# Patient Record
Sex: Female | Born: 1935 | Race: White | Hispanic: No | Smoking: Never smoker
Health system: Southern US, Community
[De-identification: ages and names within clinical notes are randomized; demographics above are authoritative.]

## PROBLEM LIST (undated history)

## (undated) DIAGNOSIS — M109 Gout, unspecified: Secondary | ICD-10-CM

## (undated) DIAGNOSIS — I10 Essential (primary) hypertension: Secondary | ICD-10-CM

## (undated) DIAGNOSIS — E119 Type 2 diabetes mellitus without complications: Secondary | ICD-10-CM

## (undated) DIAGNOSIS — R011 Cardiac murmur, unspecified: Secondary | ICD-10-CM

## (undated) DIAGNOSIS — K449 Diaphragmatic hernia without obstruction or gangrene: Secondary | ICD-10-CM

## (undated) DIAGNOSIS — K219 Gastro-esophageal reflux disease without esophagitis: Secondary | ICD-10-CM

## (undated) DIAGNOSIS — I2699 Other pulmonary embolism without acute cor pulmonale: Secondary | ICD-10-CM

## (undated) DIAGNOSIS — K8020 Calculus of gallbladder without cholecystitis without obstruction: Secondary | ICD-10-CM

## (undated) DIAGNOSIS — I82409 Acute embolism and thrombosis of unspecified deep veins of unspecified lower extremity: Secondary | ICD-10-CM

## (undated) DIAGNOSIS — Z8739 Personal history of other diseases of the musculoskeletal system and connective tissue: Secondary | ICD-10-CM

## (undated) DIAGNOSIS — M8580 Other specified disorders of bone density and structure, unspecified site: Secondary | ICD-10-CM

## (undated) DIAGNOSIS — G629 Polyneuropathy, unspecified: Secondary | ICD-10-CM

## (undated) DIAGNOSIS — E785 Hyperlipidemia, unspecified: Secondary | ICD-10-CM

## (undated) DIAGNOSIS — E039 Hypothyroidism, unspecified: Secondary | ICD-10-CM

## (undated) DIAGNOSIS — K635 Polyp of colon: Secondary | ICD-10-CM

## (undated) DIAGNOSIS — K5790 Diverticulosis of intestine, part unspecified, without perforation or abscess without bleeding: Secondary | ICD-10-CM

## (undated) HISTORY — DX: Type 2 diabetes mellitus without complications: E11.9

## (undated) HISTORY — PX: CARPAL TUNNEL RELEASE: SHX101

## (undated) HISTORY — DX: Gastro-esophageal reflux disease without esophagitis: K21.9

## (undated) HISTORY — PX: COLONOSCOPY: SHX174

## (undated) HISTORY — PX: ESOPHAGOGASTRODUODENOSCOPY (EGD) WITH ESOPHAGEAL DILATION: SHX5812

## (undated) HISTORY — PX: CHOLECYSTECTOMY: SHX55

## (undated) HISTORY — PX: CATARACT EXTRACTION: SUR2

## (undated) HISTORY — PX: JOINT REPLACEMENT: SHX530

## (undated) HISTORY — DX: Personal history of other diseases of the musculoskeletal system and connective tissue: Z87.39

## (undated) HISTORY — DX: Diaphragmatic hernia without obstruction or gangrene: K44.9

## (undated) HISTORY — PX: ABDOMINAL HYSTERECTOMY: SHX81

## (undated) HISTORY — DX: Calculus of gallbladder without cholecystitis without obstruction: K80.20

## (undated) HISTORY — DX: Other specified disorders of bone density and structure, unspecified site: M85.80

## (undated) HISTORY — PX: TONSILLECTOMY: SUR1361

## (undated) HISTORY — DX: Polyneuropathy, unspecified: G62.9

## (undated) HISTORY — DX: Diverticulosis of intestine, part unspecified, without perforation or abscess without bleeding: K57.90

## (undated) HISTORY — DX: Polyp of colon: K63.5

## (undated) HISTORY — DX: Essential (primary) hypertension: I10

## (undated) HISTORY — PX: KNEE ARTHROPLASTY: SHX992

## (undated) HISTORY — DX: Hyperlipidemia, unspecified: E78.5

---

## 1993-09-14 HISTORY — PX: CARDIAC CATHETERIZATION: SHX172

## 2008-10-15 LAB — HM DEXA SCAN

## 2012-10-19 ENCOUNTER — Ambulatory Visit (HOSPITAL_BASED_OUTPATIENT_CLINIC_OR_DEPARTMENT_OTHER)
Admission: RE | Admit: 2012-10-19 | Discharge: 2012-10-19 | Disposition: A | Discharge status: Home / Self Care | Payer: Medicare Other | Source: Ambulatory Visit | Attending: Family Medicine | Admitting: Family Medicine

## 2012-10-19 ENCOUNTER — Other Ambulatory Visit (HOSPITAL_BASED_OUTPATIENT_CLINIC_OR_DEPARTMENT_OTHER): Payer: Self-pay | Admitting: Family Medicine

## 2012-10-19 DIAGNOSIS — Z1231 Encounter for screening mammogram for malignant neoplasm of breast: Secondary | ICD-10-CM

## 2012-11-10 ENCOUNTER — Encounter: Payer: Self-pay | Admitting: Family Medicine

## 2012-11-10 DIAGNOSIS — N281 Cyst of kidney, acquired: Secondary | ICD-10-CM | POA: Insufficient documentation

## 2012-11-10 DIAGNOSIS — M8580 Other specified disorders of bone density and structure, unspecified site: Secondary | ICD-10-CM | POA: Insufficient documentation

## 2012-11-10 DIAGNOSIS — K5790 Diverticulosis of intestine, part unspecified, without perforation or abscess without bleeding: Secondary | ICD-10-CM | POA: Insufficient documentation

## 2012-11-10 DIAGNOSIS — Z8739 Personal history of other diseases of the musculoskeletal system and connective tissue: Secondary | ICD-10-CM | POA: Insufficient documentation

## 2012-11-10 DIAGNOSIS — G629 Polyneuropathy, unspecified: Secondary | ICD-10-CM | POA: Insufficient documentation

## 2012-11-10 DIAGNOSIS — K635 Polyp of colon: Secondary | ICD-10-CM | POA: Insufficient documentation

## 2012-11-10 DIAGNOSIS — E119 Type 2 diabetes mellitus without complications: Secondary | ICD-10-CM | POA: Insufficient documentation

## 2012-11-10 DIAGNOSIS — E785 Hyperlipidemia, unspecified: Secondary | ICD-10-CM | POA: Insufficient documentation

## 2012-11-10 DIAGNOSIS — I10 Essential (primary) hypertension: Secondary | ICD-10-CM | POA: Insufficient documentation

## 2012-11-10 DIAGNOSIS — K219 Gastro-esophageal reflux disease without esophagitis: Secondary | ICD-10-CM | POA: Insufficient documentation

## 2012-12-22 ENCOUNTER — Ambulatory Visit: Payer: Medicare Other | Admitting: Family Medicine

## 2013-07-03 ENCOUNTER — Other Ambulatory Visit: Payer: Self-pay | Admitting: *Deleted

## 2013-07-03 DIAGNOSIS — I10 Essential (primary) hypertension: Secondary | ICD-10-CM

## 2013-07-03 DIAGNOSIS — R5381 Other malaise: Secondary | ICD-10-CM

## 2013-07-03 DIAGNOSIS — E785 Hyperlipidemia, unspecified: Secondary | ICD-10-CM

## 2013-07-05 ENCOUNTER — Ambulatory Visit: Payer: Medicare Other

## 2013-07-05 LAB — LIPID PANEL
Cholesterol: 202 mg/dL — ABNORMAL HIGH (ref 0–200)
HDL: 59 mg/dL (ref >39)
LDL Cholesterol: 90 mg/dL (ref 0–99)
Total CHOL/HDL Ratio: 3.4 Ratio
Triglycerides: 267 mg/dL — ABNORMAL HIGH (ref <150)
VLDL: 53 mg/dL — ABNORMAL HIGH (ref 0–40)

## 2013-07-05 LAB — CBC WITH DIFFERENTIAL/PLATELET
Basophils Absolute: 0.1 10*3/uL (ref 0.0–0.1)
Basophils Relative: 1 % (ref 0–1)
Eosinophils Absolute: 0.3 10*3/uL (ref 0.0–0.7)
Eosinophils Relative: 3 % (ref 0–5)
HCT: 34.9 % — ABNORMAL LOW (ref 36.0–46.0)
Hemoglobin: 11.4 g/dL — ABNORMAL LOW (ref 12.0–15.0)
Lymphocytes Relative: 28 % (ref 12–46)
Lymphs Abs: 2.8 10*3/uL (ref 0.7–4.0)
MCH: 28.8 pg (ref 26.0–34.0)
MCHC: 32.7 g/dL (ref 30.0–36.0)
MCV: 88.1 fL (ref 78.0–100.0)
Monocytes Absolute: 0.7 10*3/uL (ref 0.1–1.0)
Monocytes Relative: 7 % (ref 3–12)
Neutro Abs: 6.2 10*3/uL (ref 1.7–7.7)
Neutrophils Relative %: 61 % (ref 43–77)
Platelets: 266 10*3/uL (ref 150–400)
RBC: 3.96 MIL/uL (ref 3.87–5.11)
RDW: 15.5 % (ref 11.5–15.5)
WBC: 10 10*3/uL (ref 4.0–10.5)

## 2013-07-05 LAB — COMPLETE METABOLIC PANEL WITH GFR
ALT: 13 U/L (ref 0–35)
AST: 17 U/L (ref 0–37)
Albumin: 4.5 g/dL (ref 3.5–5.2)
Alkaline Phosphatase: 47 U/L (ref 39–117)
BUN: 30 mg/dL — ABNORMAL HIGH (ref 6–23)
CO2: 25 mEq/L (ref 19–32)
Calcium: 9.3 mg/dL (ref 8.4–10.5)
Chloride: 104 mEq/L (ref 96–112)
Creat: 1.41 mg/dL — ABNORMAL HIGH (ref 0.50–1.10)
GFR, Est African American: 40 mL/min — ABNORMAL LOW
GFR, Est Non African American: 35 mL/min — ABNORMAL LOW
Glucose, Bld: 127 mg/dL — ABNORMAL HIGH (ref 70–99)
Potassium: 4.8 mEq/L (ref 3.5–5.3)
Sodium: 139 mEq/L (ref 135–145)
Total Bilirubin: 0.6 mg/dL (ref 0.3–1.2)
Total Protein: 6.6 g/dL (ref 6.0–8.3)

## 2013-07-06 LAB — MICROALBUMIN, URINE: Microalb, Ur: 5.34 mg/dL — ABNORMAL HIGH (ref 0.00–1.89)

## 2013-07-19 ENCOUNTER — Ambulatory Visit (INDEPENDENT_AMBULATORY_CARE_PROVIDER_SITE_OTHER): Payer: Medicare Other | Admitting: Family Medicine

## 2013-07-19 ENCOUNTER — Encounter: Payer: Self-pay | Admitting: Family Medicine

## 2013-07-19 VITALS — BP 129/64 | HR 68 | Resp 16 | Wt 240.0 lb

## 2013-07-19 DIAGNOSIS — E785 Hyperlipidemia, unspecified: Secondary | ICD-10-CM

## 2013-07-19 DIAGNOSIS — M109 Gout, unspecified: Secondary | ICD-10-CM

## 2013-07-19 DIAGNOSIS — Z23 Encounter for immunization: Secondary | ICD-10-CM

## 2013-07-19 DIAGNOSIS — E039 Hypothyroidism, unspecified: Secondary | ICD-10-CM

## 2013-07-19 DIAGNOSIS — I10 Essential (primary) hypertension: Secondary | ICD-10-CM

## 2013-07-19 DIAGNOSIS — E119 Type 2 diabetes mellitus without complications: Secondary | ICD-10-CM

## 2013-07-19 DIAGNOSIS — R197 Diarrhea, unspecified: Secondary | ICD-10-CM

## 2013-07-19 DIAGNOSIS — R809 Proteinuria, unspecified: Secondary | ICD-10-CM

## 2013-07-19 DIAGNOSIS — D649 Anemia, unspecified: Secondary | ICD-10-CM

## 2013-07-19 LAB — POCT GLYCOSYLATED HEMOGLOBIN (HGB A1C): Hemoglobin A1C: 5.9

## 2013-07-19 MED ORDER — OLMESARTAN-AMLODIPINE-HCTZ 40-10-25 MG PO TABS: 1.0000 | ORAL_TABLET | Freq: Every day | ORAL | Status: DC

## 2013-07-19 MED ORDER — COLESTIPOL HCL 1 G PO TABS: 2.0000 g | ORAL_TABLET | Freq: Two times a day (BID) | ORAL | Status: DC

## 2013-07-19 MED ORDER — ALLOPURINOL 300 MG PO TABS: 300.0000 mg | ORAL_TABLET | Freq: Every day | ORAL | Status: DC

## 2013-07-19 MED ORDER — SAXAGLIPTIN HCL 5 MG PO TABS: 5.0000 mg | ORAL_TABLET | Freq: Every day | ORAL | Status: DC

## 2013-07-19 NOTE — Progress Notes (Signed)
Subjective:    Patient ID: Amber Knox, female    DOB: 25-Oct-1929, 79 y.o.   MRN: [id number]  HPI  Amber Knox is here today to go over her most recent lab results and to discuss the conditions listed below:    1)  Type II DM:  She is doing well taking Metformin 1000 mg (twice daily) and Onglyza 5 mg (once daily).  She continues to check her blood sugars daily and says that they run around 120-130.  She needs both medications refilled.   2)  Hypertension:  Her blood pressure is well controlled with Tribenzor 40-10-25 mg.  She needs a refill on it.   3)  Diarrhea:  She continues taking colestipol 1 g (3 pills in the morning) which helps to reduce her diarrhea.    4)  Gout:  She is doing well with her allopurinol (300 mg).  She also has Colcrys at home but has not had any gout attacks recently.   5)  Fatigue:  She has been feeling very fatigued.     Review of Systems  Constitutional: Positive for activity change and fatigue. Negative for unexpected weight change.  HENT: Negative.   Eyes: Negative for visual disturbance.  Respiratory: Negative for chest tightness and shortness of breath.   Cardiovascular: Negative for chest pain, palpitations and leg swelling.  Gastrointestinal: Positive for diarrhea.  Endocrine: Negative for polydipsia, polyphagia and polyuria.  Genitourinary: Negative for difficulty urinating.  Musculoskeletal: Positive for myalgias. Negative for arthralgias.  Skin: Negative for rash.  Allergic/Immunologic: Negative.   Neurological: Negative.   Hematological: Does not bruise/bleed easily.  Psychiatric/Behavioral: Negative.      Past Medical History  Diagnosis Date  . DM type 2 (diabetes mellitus, type 2)   . Hyperlipemia   . Hypertension   . Peripheral neuropathy   . Osteopenia     Lumbar spine  . Hx of osteoarthritis     bilateral knee osteoarthritis  -   bilateral knee replacement  . Hiatal hernia   . GERD (gastroesophageal reflux disease)   . Kidney  cysts     bilateral simple cysts  . Colon polyps     Tubular Adenoma  . Diverticulosis      Past Surgical History  Procedure Laterality Date  . Abdominal hysterectomy      excessive bleeding (IUD)  . 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 Vascular Disease Father     Cardio  . Diabetes type II Other   . Diabetes type II Other   . Breast cancer Paternal Aunt      Current Outpatient Prescriptions on File Prior to Visit  Medication Sig Dispense Refill  . aspirin 81 MG tablet Take 81 mg by mouth daily.      . colchicine 0.6 MG tablet Take 0.6 mg by mouth 2 (two) times daily.       No current  facility-administered medications on file prior to visit.     Allergies  Allergen Reactions  . Ace Inhibitors Cough     Immunization History  Administered Date(s) Administered  . Influenza,inj,Quad PF,36+ Mos 07/19/2013  . Pneumococcal Conjugate-13 08/17/2005  . Tdap 06/10/2011  . Zoster 02/13/2008      Objective:   Physical Exam  Vitals reviewed. Constitutional: She is oriented to person, place, and time. She appears well-developed and well-nourished. She does not appear ill.  HENT:  Head: Normocephalic and atraumatic. Hair is normal.  Mouth/Throat: Oropharynx is clear and moist.  Eyes: Conjunctivae are normal. No scleral icterus.  Neck: Neck supple. No JVD present. No thyromegaly present.  Cardiovascular: Normal rate and regular rhythm.  Exam reveals no gallop and no friction  rub.   No murmur heard. Pulmonary/Chest: Effort normal and breath sounds normal. No respiratory distress. She has no wheezes. She exhibits no tenderness.  Abdominal: She exhibits no distension and no mass. There is no tenderness.  Musculoskeletal: Normal range of motion. She exhibits no edema and no tenderness.  Lymphadenopathy:    She has no cervical adenopathy.  Neurological: She is alert and oriented to person, place, and time.  Skin: Skin is warm and dry. No rash noted.  Psychiatric: She has a normal mood and affect. Her behavior is normal. Judgment and thought content normal.      Assessment & Plan:   Terriah was seen today for medication management.  Diagnoses and associated orders for this visit:  Type II or unspecified type diabetes mellitus without mention of complication, not stated as uncontrolled  Comments: Her A1c is perfect!! Her creatinine is >1.3 so she may need to hold her metformin.  She is to drink a lot of water and we'll recheck a BMET.    - POCT glycosylated hemoglobin (Hb A1C)  Her A1c is great at 5.9%.    - saxagliptin HCl (ONGLYZA) 5 MG TABS tablet; Take 1 tablet (5 mg total) by mouth daily. - COMPLETE METABOLIC PANEL WITH GFR; Future  Essential hypertension, benign Comments: Well controlled on current meds.   - Olmesartan-Amlodipine-HCTZ (TRIBENZOR) 40-10-25 MG TABS; Take 1 tablet by mouth daily.  Gout Comments: Stable  - allopurinol (ZYLOPRIM) 300 MG tablet; Take 1 tablet (300 mg total) by mouth daily.  Other and unspecified hyperlipidemia Comments: Her triglycerides are high.  She is going to work harder on her diet.    - colestipol (COLESTID) 1 G tablet; Take 2 tablets (2 g total) by mouth 2 (two) times daily.  Diarrhea Comments: Stable  - colestipol (COLESTID) 1 G tablet; Take 2 tablets (2 g total) by mouth 2 (two) times daily.  Unspecified hypothyroidism Comments: Checking her thyroid levels.    - TSH; Future - T3, free; Future - T4, free;  Future  Anemia Comments: Checking her iron and Vitamin B12 levels.    - Iron and TIBC; Future - Vitamin B12; Future  Proteinuria Comments: She is to do a 24 hour urine.   - Protein, Urine, 24 hour; Future  Need for prophylactic vaccination and inoculation against influenza Comments: Flu shot given without difficulty.   - Flu Vaccine QUAD 36+ mos PF IM (Fluarix)  TIME SPENT "FACE TO FACE" WITH PATIENT - 60  MINS

## 2013-07-19 NOTE — Patient Instructions (Signed)
1)  24 hour urine  Wednesday morning - 1st morning urine after midnight discard then collect all the others for the next 24 hours including the next one after midnight. Bring to the office onThursday and get labs.       Proteinuria Proteinuria is a condition in which urine contains more protein than is normal. Proteinuria is either a sign that your body is producing too much protein or a sign that there is a problem with the kidneys. Healthy kidneys prevent most substances that the body needs, including proteins, from leaving the bloodstream and ending up in urine. CAUSES  Proteinuria may be caused by a temporary event or condition such as stress, exercise, or fever, and go away on its own. Proteinuria may also be a symptom of a more serious condition or disease. Causes of proteinuria include:  A kidney disease caused by:  Diabetes.  High blood pressure (hypertension).   A disease that affects the immune system, such as lupus.  A genetic disease, such as Alport's syndrome.  Medicines that damage the kidneys, such as long-term nonsteroidal anti-inflammatory drugs (NSAIDs).  Poisoning or exposure to toxic substances.  A reoccurring kidney or urinary infection.  Excess protein production in the body caused by:  Multiple myeloma.  Amyloidosis. SYMPTOMS You may have proteinuria without having noticeable symptoms. If there is a large amount of protein in your urine, your urine may look foamy. You may also notice swelling (edema) in your hands, feet, abdomen, or face. DIAGNOSIS To determine whether you have proteinuria, you will need to provide a urine sample. Your urine will then be tested for too much protein and the main blood protein albumin. If your test shows that you have proteinuria, you may need to take additional tests to determine its cause, how much protein is in your urine, and what type of protein is being lost. Tests may include:  Blood tests.  Urine tests.  A  blood pressure measurement.  Imaging tests. TREATMENT  Treatment will depend on the cause of your proteinuria. Your caregiver will discuss treatment options with you after you have been diagnosed. If your proteinuria is mild or temporary, no treatment may be necessary. HOME CARE INSTRUCTIONS Ask your caregiver if monitoring the level of protein in your urine at home using simple testing strips is appropriate for you. Early detection of proteinuria can lead to early and often successful treatment of the condition causing it. Document Released: 10/21/2005 Document Revised: 05/25/2012 Document Reviewed: 01/29/2012 [facility] Patient Information 2014 [place], [place].

## 2013-07-26 ENCOUNTER — Other Ambulatory Visit: Payer: Self-pay | Admitting: *Deleted

## 2013-07-26 DIAGNOSIS — E039 Hypothyroidism, unspecified: Secondary | ICD-10-CM

## 2013-07-26 DIAGNOSIS — R809 Proteinuria, unspecified: Secondary | ICD-10-CM

## 2013-07-26 DIAGNOSIS — D649 Anemia, unspecified: Secondary | ICD-10-CM

## 2013-07-26 DIAGNOSIS — E119 Type 2 diabetes mellitus without complications: Secondary | ICD-10-CM

## 2013-07-27 ENCOUNTER — Ambulatory Visit: Payer: Medicare Other | Admitting: Family Medicine

## 2013-07-27 LAB — COMPLETE METABOLIC PANEL WITH GFR
ALT: 11 U/L (ref 0–35)
AST: 14 U/L (ref 0–37)
Albumin: 4.2 g/dL (ref 3.5–5.2)
Alkaline Phosphatase: 46 U/L (ref 39–117)
BUN: 29 mg/dL — ABNORMAL HIGH (ref 6–23)
CO2: 25 mEq/L (ref 19–32)
Calcium: 9.6 mg/dL (ref 8.4–10.5)
Chloride: 103 mEq/L (ref 96–112)
Creat: 1.26 mg/dL — ABNORMAL HIGH (ref 0.50–1.10)
GFR, Est African American: 46 mL/min — ABNORMAL LOW
GFR, Est Non African American: 40 mL/min — ABNORMAL LOW
Glucose, Bld: 130 mg/dL — ABNORMAL HIGH (ref 70–99)
Potassium: 4.3 mEq/L (ref 3.5–5.3)
Sodium: 140 mEq/L (ref 135–145)
Total Bilirubin: 0.6 mg/dL (ref 0.3–1.2)
Total Protein: 6.6 g/dL (ref 6.0–8.3)

## 2013-07-27 LAB — IRON AND TIBC
%SAT: 17 % — ABNORMAL LOW (ref 20–55)
Iron: 58 ug/dL (ref 42–145)
TIBC: 342 ug/dL (ref 250–470)
UIBC: 284 ug/dL (ref 125–400)

## 2013-07-27 LAB — PROTEIN, URINE, 24 HOUR
Protein, 24H Urine: 210 mg/d — ABNORMAL HIGH (ref 50–100)
Protein, Urine: 7 mg/dL

## 2013-07-27 LAB — T4, FREE: Free T4: 1.3 ng/dL (ref 0.80–1.80)

## 2013-07-27 LAB — VITAMIN B12: Vitamin B-12: 427 pg/mL (ref 211–911)

## 2013-07-27 LAB — TSH: TSH: 0.372 u[IU]/mL (ref 0.350–4.500)

## 2013-07-27 LAB — T3, FREE: T3, Free: 2 pg/mL — ABNORMAL LOW (ref 2.3–4.2)

## 2013-07-28 ENCOUNTER — Encounter: Payer: Self-pay | Admitting: Family Medicine

## 2013-07-28 ENCOUNTER — Ambulatory Visit (INDEPENDENT_AMBULATORY_CARE_PROVIDER_SITE_OTHER): Payer: Medicare Other | Admitting: Family Medicine

## 2013-07-28 VITALS — BP 149/73 | HR 63 | Resp 16 | Ht 60.5 in | Wt 248.0 lb

## 2013-07-28 DIAGNOSIS — E039 Hypothyroidism, unspecified: Secondary | ICD-10-CM

## 2013-07-28 DIAGNOSIS — E119 Type 2 diabetes mellitus without complications: Secondary | ICD-10-CM

## 2013-07-28 DIAGNOSIS — D649 Anemia, unspecified: Secondary | ICD-10-CM

## 2013-07-28 MED ORDER — METFORMIN HCL 1000 MG PO TABS: 1000.0000 mg | ORAL_TABLET | Freq: Two times a day (BID) | ORAL | Status: DC

## 2013-07-28 MED ORDER — LEVOTHYROXINE SODIUM 112 MCG PO TABS: 112.0000 ug | ORAL_TABLET | Freq: Every day | ORAL | Status: DC

## 2013-07-28 NOTE — Progress Notes (Signed)
Subjective:    Patient ID: Amber Knox, female    DOB: October 21, 1929, 79 y.o.   MRN: [id number]  HPI  Amber Knox is here today to go over her most recent lab results.  She also wants to discuss the conditions listed below:   1)  Type II DM - She needs a prescription for metformin if she is going to remain on it.      2)  Hypothyroidism - She is currently taking 88 mcg of levothyroxine.  She is experiencing some fatigue and wonders if her dosage should be increased.     Review of Systems  Constitutional: Positive for fatigue.  HENT: Negative.   Eyes: Negative.   Respiratory: Negative.   Cardiovascular: Negative.   Gastrointestinal: Negative.   Endocrine: Negative.   Genitourinary: Negative.   Musculoskeletal: Negative.   Skin: Negative.   Allergic/Immunologic: Negative.   Neurological: Negative.   Hematological: Negative.   Psychiatric/Behavioral: Negative.      Past Medical History  Diagnosis Date  . DM type 2 (diabetes mellitus, type 2)   . Hyperlipemia   . Hypertension   . Peripheral neuropathy   . Osteopenia     Lumbar spine  . Hx of osteoarthritis     bilateral knee osteoarthritis  -   bilateral knee replacement  . Hiatal hernia   . GERD (gastroesophageal reflux disease)   . Kidney cysts     bilateral simple cysts  . Colon polyps     Tubular Adenoma  . Diverticulosis      Family History  Problem Relation Age of Onset  . Vascular Disease Father     Cardio  . Diabetes type II Other   . Diabetes type II Other   . 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 Constitutional: She is oriented to person, place, and time. She appears well-developed and well-nourished.  Cardiovascular: Normal rate and regular rhythm.   Pulmonary/Chest: Effort normal and breath sounds normal.  Neurological: She is alert and oriented to person, place, and time.  Skin: Skin is warm and dry.  Psychiatric: She has a normal mood and affect.      Assessment & Plan:    Amber Knox was seen today for medication management.  Diagnoses and associated orders for this visit:  Unspecified hypothyroidism Comments: We're increasing her levothyroxine from 88 mcg to 112 mcg.   - levothyroxine (SYNTHROID, LEVOTHROID) 112 MCG tablet; Take 1 tablet (112 mcg total) by mouth daily.  Type II or unspecified type diabetes mellitus without mention of complication, not stated as uncontrolled Comments: Her creatinine has decreased so she can stay on the metfromin..   - metFORMIN (GLUCOPHAGE) 1000 MG tablet; Take 1 tablet (1,000 mg total) by mouth 2 (two) times daily with a meal.  Anemia Comments: She is to take some Nu-Iron.  We'll recheck her hemoglobin in 3 months.  TIME SPENT "FACE TO FACE" WITH PATIENT -  30 MINS

## 2013-07-28 NOTE — Patient Instructions (Addendum)
1)  Kidney Function - It is better than before so you can stay on the metformin.  You are having an increased amount of protein come out of your kidneys so try to limit the amount of animal protein you are taking in.    2)  Hypothyroidism - Increase your dosage to 112 mcg.  We'll recheck your level in 3 months.  3)  Iron - Take Nu-Iron 150 mg (1- 2 per day).  We'll recheck your CBC in 3 months.     Proteinuria Proteinuria is a condition in which urine contains more protein than is normal. Proteinuria is either a sign that your body is producing too much protein or a sign that there is a problem with the kidneys. Healthy kidneys prevent most substances that the body needs, including proteins, from leaving the bloodstream and ending up in urine. CAUSES  Proteinuria may be caused by a temporary event or condition such as stress, exercise, or fever, and go away on its own. Proteinuria may also be a symptom of a more serious condition or disease. Causes of proteinuria include:  A kidney disease caused by:  Diabetes.  High blood pressure (hypertension).   A disease that affects the immune system, such as lupus.  A genetic disease, such as Alport's syndrome.  Medicines that damage the kidneys, such as long-term nonsteroidal anti-inflammatory drugs (NSAIDs).  Poisoning or exposure to toxic substances.  A reoccurring kidney or urinary infection.  Excess protein production in the body caused by:  Multiple myeloma.  Amyloidosis. SYMPTOMS You may have proteinuria without having noticeable symptoms. If there is a large amount of protein in your urine, your urine may look foamy. You may also notice swelling (edema) in your hands, feet, abdomen, or face. DIAGNOSIS To determine whether you have proteinuria, you will need to provide a urine sample. Your urine will then be tested for too much protein and the main blood protein albumin. If your test shows that you have proteinuria, you may need to  take additional tests to determine its cause, how much protein is in your urine, and what type of protein is being lost. Tests may include:  Blood tests.  Urine tests.  A blood pressure measurement.  Imaging tests. TREATMENT                                                                                                                                                                                                                    Treatment will depend on  the cause of your proteinuria. Your caregiver will discuss treatment options with you after you have been diagnosed. If your proteinuria is mild or temporary, no treatment may be necessary. HOME CARE INSTRUCTIONS Ask your caregiver if monitoring the level of protein in your urine at home using simple testing strips is appropriate for you. Early detection of proteinuria can lead to early and often successful treatment of the condition causing it. Document Released: 10/21/2005 Document Revised: 05/25/2012 Document Reviewed: 01/29/2012 [facility] Patient Information 2014 [place], [place].

## 2013-09-18 ENCOUNTER — Encounter: Payer: Self-pay | Admitting: Family Medicine

## 2013-09-30 DIAGNOSIS — D649 Anemia, unspecified: Secondary | ICD-10-CM | POA: Insufficient documentation

## 2013-09-30 DIAGNOSIS — E039 Hypothyroidism, unspecified: Secondary | ICD-10-CM | POA: Insufficient documentation

## 2013-09-30 DIAGNOSIS — E119 Type 2 diabetes mellitus without complications: Secondary | ICD-10-CM | POA: Insufficient documentation

## 2013-10-03 ENCOUNTER — Encounter: Payer: Self-pay | Admitting: Family Medicine

## 2013-10-03 ENCOUNTER — Ambulatory Visit (HOSPITAL_BASED_OUTPATIENT_CLINIC_OR_DEPARTMENT_OTHER)
Admission: RE | Admit: 2013-10-03 | Discharge: 2013-10-03 | Disposition: A | Discharge status: Home / Self Care | Payer: Medicare Other | Source: Ambulatory Visit | Attending: Family Medicine | Admitting: Family Medicine

## 2013-10-03 ENCOUNTER — Ambulatory Visit (INDEPENDENT_AMBULATORY_CARE_PROVIDER_SITE_OTHER): Payer: Medicare Other | Admitting: Family Medicine

## 2013-10-03 VITALS — BP 145/73 | HR 71 | Resp 16 | Wt 236.0 lb

## 2013-10-03 DIAGNOSIS — M169 Osteoarthritis of hip, unspecified: Secondary | ICD-10-CM | POA: Insufficient documentation

## 2013-10-03 DIAGNOSIS — M25559 Pain in unspecified hip: Secondary | ICD-10-CM

## 2013-10-03 DIAGNOSIS — W19XXXA Unspecified fall, initial encounter: Secondary | ICD-10-CM | POA: Insufficient documentation

## 2013-10-03 DIAGNOSIS — M25551 Pain in right hip: Secondary | ICD-10-CM

## 2013-10-03 DIAGNOSIS — M25552 Pain in left hip: Principal | ICD-10-CM

## 2013-10-03 DIAGNOSIS — M1610 Unilateral primary osteoarthritis, unspecified hip: Secondary | ICD-10-CM | POA: Insufficient documentation

## 2013-10-03 MED ORDER — METHYLPREDNISOLONE SODIUM SUCC 125 MG IJ SOLR
125.0000 mg | Freq: Once | INTRAMUSCULAR | Status: AC
  Administered 2013-10-03: 125 mg via INTRAMUSCULAR

## 2013-10-03 MED ORDER — KETOROLAC TROMETHAMINE 60 MG/2ML IM SOLN
60.0000 mg | Freq: Once | INTRAMUSCULAR | Status: AC
  Administered 2013-10-03: 60 mg via INTRAMUSCULAR

## 2013-10-03 MED ORDER — TRAMADOL-ACETAMINOPHEN 37.5-325 MG PO TABS: ORAL_TABLET | ORAL | Status: DC

## 2013-10-03 MED ORDER — TIZANIDINE HCL 4 MG PO TABS: 4.0000 mg | ORAL_TABLET | Freq: Three times a day (TID) | ORAL | Status: DC

## 2013-10-03 NOTE — Progress Notes (Signed)
Subjective:    Patient ID: Amber Knox, female    DOB: 06-23-1930, 80 y.o.   MRN: [id number]  HPI  Amber Knox is here today complaining of back/ hip pain.  She says that she fell about a month ago.  She was trying to climb into a tall bed and fell on her knees and hands.  She has been seeing her chiropractor (Dr. Collins [place]) since she fell.  She feels that his therapy has helped her with everything else except her lower back and hip pain.  She has taken Tylenol and Advil which have helped her very much.    Review of Systems  Musculoskeletal: Positive for back pain.       Radiates to both hips.   Neurological: Negative for weakness and numbness.    Past Medical History  Diagnosis Date  . DM type 2 (diabetes mellitus, type 2)   . Hyperlipemia   . Hypertension   . Peripheral neuropathy   . Osteopenia     Lumbar spine  . Hx of osteoarthritis     bilateral knee osteoarthritis  -   bilateral knee replacement  . Hiatal hernia   . GERD (gastroesophageal reflux disease)   . Kidney cysts     bilateral simple cysts  . Colon polyps     Tubular Adenoma  . Diverticulosis      Past Surgical History  Procedure Laterality Date  . Abdominal hysterectomy      excessive bleeding (IUD)  . 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 Vascular Disease Father     Cardio  . Diabetes type II Other   . Diabetes type II Other   . Breast cancer Paternal Aunt      Current Outpatient Prescriptions on File Prior  to Visit  Medication Sig Dispense Refill  . allopurinol (ZYLOPRIM) 300 MG tablet Take 1 tablet (300 mg total) by mouth daily.  90 tablet  3  . aspirin 81 MG tablet Take 81 mg by mouth daily.      . colchicine 0.6 MG tablet Take 0.6 mg by mouth 2 (two) times daily.      . colestipol (COLESTID) 1 G tablet Take 2 tablets (2 g total) by mouth 2 (two) times daily.  360 tablet  3  . levothyroxine (SYNTHROID, LEVOTHROID) 112 MCG tablet Take 1 tablet (112 mcg total) by mouth daily.  90 tablet  0  . metFORMIN (GLUCOPHAGE) 1000 MG tablet Take 1 tablet (1,000 mg total) by mouth 2 (two) times daily with a meal.  90 tablet  3  . Olmesartan-Amlodipine-HCTZ (TRIBENZOR) 40-10-25 MG TABS Take 1 tablet by mouth daily.  90 tablet  3  . saxagliptin HCl (ONGLYZA) 5 MG TABS tablet Take 1 tablet (5 mg total) by mouth daily.  90 tablet  3   No current facility-administered medications on file prior to visit.     Allergies  Allergen Reactions  . Ace Inhibitors Cough     Immunization History  Administered Date(s) Administered  . Influenza,inj,Quad PF,36+ Mos 07/19/2013  . Pneumococcal Conjugate-13 08/17/2005  . Tdap 06/10/2011  . Zoster 02/13/2008      Objective:   Physical Exam  Vitals reviewed. Constitutional: She is oriented to person, place, and time. She appears well-nourished. No distress.  Cardiovascular: Normal rate and regular rhythm.   Pulmonary/Chest: Effort normal.  Musculoskeletal: She exhibits tenderness (Low back and hip pain. ).  Neurological: She is alert and oriented to person, place, and time. Coordination normal.  Skin: No rash noted.  Psychiatric: She has a normal mood and affect. Her behavior is normal. Judgment and thought content normal.      Assessment & Plan:    [name] was seen today for back pain and hip pain.  Diagnoses and associated orders for this visit:  Hip pain, bilateral - DG Hip Bilateral W/Pelvis - DG Lumbar Spine Complete  - traMADol-acetaminophen  (ULTRACET) 37.5-325 MG per tablet; Take 1-2 tabs up to twice a day for pain - methylPREDNISolone sodium succinate (SOLU-MEDROL) 125 mg/2 mL injection 125 mg; Inject 2 mLs (125 mg total) into the muscle once. - ketorolac (TORADOL) injection 60 mg; Inject 2 mLs (60 mg total) into the muscle once. -  tiZANidine (ZANAFLEX) 4 MG tablet; Take 1 tablet (4 mg total) by mouth 3 (three) times daily. For muscle spasm

## 2013-10-23 ENCOUNTER — Ambulatory Visit (INDEPENDENT_AMBULATORY_CARE_PROVIDER_SITE_OTHER): Payer: Medicare Other | Admitting: Family Medicine

## 2013-10-23 ENCOUNTER — Encounter: Payer: Self-pay | Admitting: Family Medicine

## 2013-10-23 VITALS — BP 147/65 | HR 68 | Resp 16

## 2013-10-23 DIAGNOSIS — M549 Dorsalgia, unspecified: Secondary | ICD-10-CM

## 2013-10-23 MED ORDER — LIDOCAINE 5 % EX PTCH: MEDICATED_PATCH | CUTANEOUS | Status: DC

## 2013-10-23 MED ORDER — PROMETHAZINE HCL 12.5 MG PO TABS: ORAL_TABLET | ORAL | Status: DC

## 2013-10-23 MED ORDER — CELECOXIB 200 MG PO CAPS: 200.0000 mg | ORAL_CAPSULE | Freq: Every day | ORAL | Status: DC

## 2013-10-23 MED ORDER — KETOROLAC TROMETHAMINE 60 MG/2ML IM SOLN: 60.0000 mg | Freq: Once | INTRAMUSCULAR | Status: DC

## 2013-10-23 MED ORDER — DULOXETINE HCL 30 MG PO CPEP: ORAL_CAPSULE | ORAL | Status: DC

## 2013-10-23 MED ORDER — KETOROLAC TROMETHAMINE 60 MG/2ML IM SOLN
30.0000 mg | Freq: Once | INTRAMUSCULAR | Status: AC
  Administered 2013-10-23: 30 mg via INTRAMUSCULAR

## 2013-10-23 MED ORDER — METHYLPREDNISOLONE SODIUM SUCC 125 MG IJ SOLR
125.0000 mg | Freq: Once | INTRAMUSCULAR | Status: AC
  Administered 2013-10-23: 125 mg via INTRAMUSCULAR

## 2013-10-23 NOTE — Progress Notes (Signed)
Subjective:    Patient ID: Amber Knox, female    DOB: Jan 19, 1930, 80 y.o.   MRN: [id number]  HPI  Anaija is back today with her daughter [name]) to discuss her on-going back pain.  She felt great after her last office visit when she received injections of Solu-Medrol and Toradol.  Unfortunately, her pain has returned.  This morning, she fell which concerns her daughter.  She thinks that her right knee gave out which contributed to her fall.  She tried taking Zanaflex but she felt "drunk" on it so she stopped taking it.  She has taken Tramadol occasionally at night which has helped her to fall asleep.       Review of Systems  Respiratory: Negative for shortness of breath.   Cardiovascular: Negative for chest pain, palpitations and leg swelling.  Musculoskeletal: Positive for back pain.  Neurological: Negative for weakness.  All other systems reviewed and are negative.     Past Medical History  Diagnosis Date  . DM type 2 (diabetes mellitus, type 2)   . Hyperlipemia   . Hypertension   . Peripheral neuropathy   . Osteopenia     Lumbar spine  . Hx of osteoarthritis     bilateral knee osteoarthritis  -   bilateral knee replacement  . Hiatal hernia   . GERD (gastroesophageal reflux disease)   . Kidney cysts     bilateral simple cysts  . Colon polyps     Tubular Adenoma  . Diverticulosis      Past Surgical History  Procedure Laterality Date  . Abdominal hysterectomy      excessive bleeding (IUD)  . 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 Vascular Disease Father     Cardio  . Diabetes type II Other   . Diabetes type II Other   . Breast cancer Paternal Aunt      Current Outpatient Prescriptions on File Prior to Visit  Medication Sig Dispense Refill  . allopurinol (ZYLOPRIM) 300 MG tablet Take 1 tablet (300 mg total) by mouth daily.  90 tablet  3  . aspirin 81 MG tablet Take 81 mg by mouth daily.      . colestipol (COLESTID) 1 G tablet Take 2 tablets (2 g total) by mouth 2 (two) times daily.  360 tablet  3  . levothyroxine (SYNTHROID, LEVOTHROID) 112 MCG tablet Take 1 tablet (112 mcg total) by mouth daily.  90 tablet  0  . metFORMIN (GLUCOPHAGE) 1000 MG tablet Take 1 tablet (1,000 mg total) by mouth 2 (two) times daily with a meal.  90 tablet  3  . Olmesartan-Amlodipine-HCTZ (TRIBENZOR) 40-10-25 MG TABS Take 1 tablet by mouth daily.  90 tablet  3  . saxagliptin HCl (ONGLYZA) 5 MG TABS tablet Take 1 tablet (5 mg total) by mouth daily.  90 tablet  3  . traMADol-acetaminophen (ULTRACET) 37.5-325 MG  per tablet Take 1-2 tabs up to twice a day for pain  120 tablet  1  . colchicine 0.6 MG tablet Take 0.6 mg by mouth 2 (two) times daily.       No current facility-administered medications on file prior to visit.     Allergies  Allergen Reactions  . Ace Inhibitors Cough     Immunization History  Administered Date(s) Administered  . Influenza,inj,Quad PF,36+ Mos 07/19/2013  . Pneumococcal Conjugate-13 08/17/2005  . Tdap 06/10/2011  . Zoster 02/13/2008      Objective:   Physical Exam  Constitutional: She appears well-nourished. She appears distressed.  Neck: Normal range of motion. Neck supple.  Cardiovascular: Normal rate, regular rhythm and normal heart sounds.   Pulmonary/Chest: Effort normal and breath sounds normal.  Musculoskeletal: She exhibits tenderness (Low Back Pain). She exhibits no edema.       Lumbar back: She exhibits decreased range of  motion, tenderness and spasm. She exhibits no edema and no deformity.  Neurological: She has normal reflexes. She exhibits normal muscle tone. Coordination normal.  Skin: No rash noted.      Assessment & Plan:    Carol was seen today for back pain.  Diagnoses and associated orders for this visit:  Back pain  Cheral Marker for a MRI of the lumbar spine at [facility].  I discussed her case with Dr. Felecia Shelling.  He feels that she might benefit from an epidural injection.  We will refer her to Dr. Wilford Grist.    - methylPREDNISolone sodium succinate (SOLU-MEDROL) 125 mg/2 mL injection 125 mg; Inject 2 mLs (125 mg total) into the muscle once. - ketorolac (TORADOL) injection 30 mg; Inject 1 mL (30 mg total) into the muscle once. - celecoxib (CELEBREX) 200 MG capsule; Take 1 capsule (200 mg total) by mouth daily. - lidocaine (LIDODERM) 5 %; Apply up to 3 patches to painful areas up to 12/24 hours. - DULoxetine (CYMBALTA) 30 MG capsule; Take 1 capsule daily with supper - promethazine (PHENERGAN) 12.5 MG tablet; Take 1/2 - 1 tab every 6 hours as needed for nausea

## 2013-10-23 NOTE — Patient Instructions (Signed)
1)  Back Pain - Go for the MRI on Wed 10/25/13 at 8:30.   If that is normal then we'll set you up for PT at the [facility] the Celebrex daily.  Limit your salt and try to keep legs elevated to decrease swelling.     Sciatica Sciatica is pain, weakness, numbness, or tingling along the path of the sciatic nerve. The nerve starts in the lower back and runs down the back of each leg. The nerve controls the muscles in the lower leg and in the back of the knee, while also providing sensation to the back of the thigh, lower leg, and the sole of your foot. Sciatica is a symptom of another medical condition. For instance, nerve damage or certain conditions, such as a herniated disk or bone spur on the spine, pinch or put pressure on the sciatic nerve. This causes the pain, weakness, or other sensations normally associated with sciatica. Generally, sciatica only affects one side of the body. CAUSES   Herniated or slipped disc.  Degenerative disk disease.  A pain disorder involving the narrow muscle in the buttocks (piriformis syndrome).  Pelvic injury or fracture.  Pregnancy.  Tumor (rare). SYMPTOMS  Symptoms can vary from mild to very severe. The symptoms usually travel from the low back to the buttocks and down the back of the leg. Symptoms can include:  Mild tingling or dull aches in the lower back, leg, or hip.  Numbness in the back of the calf or sole of the foot.  Burning sensations in the lower back, leg, or hip.  Sharp pains in the lower back, leg, or hip.  Leg weakness.  Severe back pain inhibiting movement. These symptoms may get worse with coughing, sneezing, laughing, or prolonged sitting or standing. Also, being overweight may worsen symptoms. DIAGNOSIS  Your caregiver will perform a physical exam to look for common symptoms of sciatica. He or she may ask you to do certain movements or activities that would trigger sciatic nerve pain. Other tests may be performed to find  the cause of the sciatica. These may include:  Blood tests.  X-rays.  Imaging tests, such as an MRI or CT scan. TREATMENT  Treatment is directed at the cause of the sciatic pain. Sometimes, treatment is not necessary and the pain and discomfort goes away on its own. If treatment is needed, your caregiver may suggest:  Over-the-counter medicines to relieve pain.  Prescription medicines, such as anti-inflammatory medicine, muscle relaxants, or narcotics.  Applying heat or ice to the painful area.  Steroid injections to lessen pain, irritation, and inflammation around the nerve.  Reducing activity during periods of pain.  Exercising and stretching to strengthen your abdomen and improve flexibility of your spine. Your caregiver may suggest losing weight if the extra weight makes the back pain worse.  Physical therapy.  Surgery to eliminate what is pressing or pinching the nerve, such as a bone spur or part of a herniated disk. HOME CARE INSTRUCTIONS   Only take over-the-counter or prescription medicines for pain or discomfort as directed by your caregiver.  Apply ice to the affected area for 20 minutes, 3 4 times a day for the first 48 72 hours. Then try heat in the same way.  Exercise, stretch, or perform your usual activities if these do not aggravate your pain.  Attend physical therapy sessions as directed by your caregiver.  Keep all follow-up appointments as directed by your caregiver.  Do not wear high heels  or shoes that do not provide proper support.  Check your mattress to see if it is too soft. A firm mattress may lessen your pain and discomfort. SEEK IMMEDIATE MEDICAL CARE IF:   You lose control of your bowel or bladder (incontinence).  You have increasing weakness in the lower back, pelvis, buttocks, or legs.  You have redness or swelling of your back.  You have a burning sensation when you urinate.  You have pain that gets worse when you lie down or awakens  you at night.  Your pain is worse than you have experienced in the past.  Your pain is lasting longer than 4 weeks.  You are suddenly losing weight without reason. MAKE SURE YOU:  Understand these instructions.  Will watch your condition.  Will get help right away if you are not doing well or get worse. Document Released: 08/25/2001 Document Revised: 03/01/2012 Document Reviewed: 01/10/2012 [facility] Patient Information 2014 [place].

## 2013-10-24 ENCOUNTER — Telehealth: Payer: Self-pay | Admitting: *Deleted

## 2013-10-24 NOTE — Telephone Encounter (Signed)
[facility] L/S Auth # [id number]

## 2013-10-30 ENCOUNTER — Encounter: Payer: Self-pay | Admitting: Family Medicine

## 2013-10-30 ENCOUNTER — Ambulatory Visit (INDEPENDENT_AMBULATORY_CARE_PROVIDER_SITE_OTHER): Payer: Medicare Other | Admitting: Family Medicine

## 2013-10-30 VITALS — BP 143/73 | HR 75 | Resp 16

## 2013-10-30 DIAGNOSIS — M25569 Pain in unspecified knee: Secondary | ICD-10-CM | POA: Insufficient documentation

## 2013-10-30 DIAGNOSIS — E119 Type 2 diabetes mellitus without complications: Secondary | ICD-10-CM

## 2013-10-30 DIAGNOSIS — D649 Anemia, unspecified: Secondary | ICD-10-CM

## 2013-10-30 DIAGNOSIS — E039 Hypothyroidism, unspecified: Secondary | ICD-10-CM

## 2013-10-30 LAB — COMPLETE METABOLIC PANEL WITH GFR
ALT: 12 U/L (ref 0–35)
AST: 15 U/L (ref 0–37)
Albumin: 4.3 g/dL (ref 3.5–5.2)
Alkaline Phosphatase: 48 U/L (ref 39–117)
BUN: 30 mg/dL — ABNORMAL HIGH (ref 6–23)
CO2: 27 mEq/L (ref 19–32)
Calcium: 9.6 mg/dL (ref 8.4–10.5)
Chloride: 99 mEq/L (ref 96–112)
Creat: 1.38 mg/dL — ABNORMAL HIGH (ref 0.50–1.10)
GFR, Est African American: 41 mL/min — ABNORMAL LOW
GFR, Est Non African American: 36 mL/min — ABNORMAL LOW
Glucose, Bld: 133 mg/dL — ABNORMAL HIGH (ref 70–99)
Potassium: 5 mEq/L (ref 3.5–5.3)
Sodium: 138 mEq/L (ref 135–145)
Total Bilirubin: 1.1 mg/dL (ref 0.2–1.2)
Total Protein: 6.7 g/dL (ref 6.0–8.3)

## 2013-10-30 LAB — T4, FREE: Free T4: 1.48 ng/dL (ref 0.80–1.80)

## 2013-10-30 LAB — CBC WITH DIFFERENTIAL/PLATELET
Basophils Absolute: 0 10*3/uL (ref 0.0–0.1)
Basophils Relative: 0 % (ref 0–1)
Eosinophils Absolute: 0.1 10*3/uL (ref 0.0–0.7)
Eosinophils Relative: 1 % (ref 0–5)
HCT: 37.5 % (ref 36.0–46.0)
Hemoglobin: 12.2 g/dL (ref 12.0–15.0)
Lymphocytes Relative: 20 % (ref 12–46)
Lymphs Abs: 2.7 10*3/uL (ref 0.7–4.0)
MCH: 28.9 pg (ref 26.0–34.0)
MCHC: 32.5 g/dL (ref 30.0–36.0)
MCV: 88.9 fL (ref 78.0–100.0)
Monocytes Absolute: 1.1 10*3/uL (ref 0.1–1.0)
Monocytes Relative: 8 % (ref 3–12)
Neutro Abs: 9.6 10*3/uL (ref 1.7–7.7)
Neutrophils Relative %: 71 % (ref 43–77)
Platelets: 304 10*3/uL (ref 150–400)
RBC: 4.22 MIL/uL (ref 3.87–5.11)
RDW: 15.4 % (ref 11.5–15.5)
WBC: 13.5 10*3/uL — ABNORMAL HIGH (ref 4.0–10.5)

## 2013-10-30 LAB — TSH: TSH: 0.641 u[IU]/mL (ref 0.350–4.500)

## 2013-10-30 LAB — T3, FREE: T3, Free: 2.5 pg/mL (ref 2.3–4.2)

## 2013-10-30 MED ORDER — METHYLPREDNISOLONE 4 MG PO KIT: PACK | ORAL | Status: AC

## 2013-10-30 MED ORDER — GABAPENTIN 300 MG PO CAPS: 300.0000 mg | ORAL_CAPSULE | Freq: Three times a day (TID) | ORAL | Status: DC

## 2013-10-30 NOTE — Progress Notes (Signed)
Subjective:    Patient ID: Amber Knox, female    DOB: 05/21/1930, 80 y.o.   MRN: [id number]   Amber Knox has returned today with her daughter Amber Knox) with worsening back pain.  She has been taking Celebrex, Cymbalta, Tizanidine and the Ultracet occasionally.  She has not used the Lidoderm patches yet.     Back Pain This is a recurrent problem. The current episode started more than 1 month ago. The problem occurs constantly. The problem has been gradually worsening since onset. The pain is present in the gluteal. The quality of the pain is described as burning and shooting. The pain radiates to the left thigh, left knee and left foot. The pain is at a severity of 10/10. The pain is severe. The pain is worse during the day. The symptoms are aggravated by standing and position. Pertinent negatives include no bladder incontinence or weakness. Risk factors include obesity and lack of exercise. She has tried analgesics, chiropractic manipulation, muscle relaxant and NSAIDs for the symptoms. The treatment provided mild relief.    Review of Systems  Genitourinary: Negative for bladder incontinence.  Musculoskeletal: Positive for back pain.  Neurological: Negative for weakness.     Past Medical History  Diagnosis Date  . DM type 2 (diabetes mellitus, type 2)   . Hyperlipemia   . Hypertension   . Peripheral neuropathy   . Osteopenia     Lumbar spine  . Hx of osteoarthritis     bilateral knee osteoarthritis  -   bilateral knee replacement  . Hiatal hernia   . GERD (gastroesophageal reflux disease)   . Kidney cysts     bilateral simple cysts  . Colon polyps     Tubular Adenoma  . Diverticulosis      Past Surgical History  Procedure Laterality Date  . Abdominal hysterectomy      excessive bleeding (IUD)  . 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 Vascular Disease Father     Cardio  . Diabetes type II Other   . Diabetes type II Other   . Breast cancer Paternal Aunt      Current Outpatient Prescriptions on File Prior to Visit  Medication Sig Dispense Refill  . allopurinol (ZYLOPRIM) 300 MG tablet Take 1 tablet (300 mg total) by mouth daily.  90 tablet  3  . aspirin 81 MG tablet Take 81 mg by mouth daily.      . celecoxib (CELEBREX) 200 MG capsule Take 1 capsule (200 mg total) by mouth daily.  30 capsule  11  . colestipol (COLESTID) 1 G tablet Take 2 tablets (2 g total) by mouth 2 (two) times daily.  360 tablet  3  . DULoxetine (CYMBALTA) 30 MG capsule Take 1 capsule daily with supper  30 capsule  0  . levothyroxine (SYNTHROID, LEVOTHROID) 112 MCG tablet Take 1 tablet (112 mcg total) by mouth daily.  90 tablet  0  . metFORMIN (GLUCOPHAGE) 1000 MG tablet Take 1 tablet (1,000 mg total) by mouth 2 (two) times daily with a meal.  90 tablet  3  . Olmesartan-Amlodipine-HCTZ (TRIBENZOR) 40-10-25 MG TABS Take 1 tablet by mouth daily.  90 tablet  3  . colchicine 0.6 MG tablet Take 0.6 mg by mouth 2 (two) times daily.      Marland Kitchen lidocaine (LIDODERM) 5 % Apply up to 3 patches to painful areas up to 12/24 hours.  90 patch  11  . promethazine (PHENERGAN) 12.5 MG tablet Take 1/2 - 1 tab every 6 hours as needed for nausea  30 tablet  1  . saxagliptin HCl (ONGLYZA) 5 MG TABS tablet Take 1 tablet (5 mg total) by mouth daily.  90 tablet  3  . traMADol-acetaminophen (ULTRACET) 37.5-325 MG per tablet Take 1-2 tabs up to twice a day for pain  120 tablet  1   No current facility-administered medications  on file prior to visit.     Allergies  Allergen Reactions  . Ace Inhibitors Cough     Immunization History  Administered Date(s) Administered  . Influenza,inj,Quad PF,36+ Mos 07/19/2013  . Pneumococcal Conjugate-13 08/17/2005  . Tdap 06/10/2011  . Zoster 02/13/2008      Objective:   Physical Exam  Vitals reviewed. Constitutional: Ill appearance: She is having significant pain.   She appears distressed.  Musculoskeletal:       Lumbar back: She exhibits pain.       Left upper leg: She exhibits tenderness.       Left lower leg: She exhibits tenderness.       Left foot: She exhibits tenderness.      Assessment & Plan:    Amber Knox was seen today for back/left leg pain.  Diagnoses and associated orders for this visit:  Pain in joint, lower leg Comments: She is to increase her intake of the Ultracet and is to apply the Lidoderm patches.  She is also to take a Medrol Dose Pak and is to add gabapentin.  She is scheduled to see Dr. Mina Marble next week (2/25) to discuss getting an epidural injection.    - methylPREDNISolone (MEDROL DOSEPAK) 4 MG tablet; follow package directions - gabapentin (NEURONTIN) 300 MG capsule; Take 1 capsule (300 mg total) by mouth 3 (three) times daily.  Type II or unspecified type diabetes mellitus without mention of complication, not stated as uncontrolled Comments: Checking a CMET.  We'll hold on checking an A1c since it has only been 3 months and she has been getting steroid shots.   - COMPLETE METABOLIC PANEL WITH GFR  Unspecified hypothyroidism Comments: Check her thyroid labs.   - TSH - T4, free - T3, free  Anemia Comments: Rechecking a CBC.   - CBC with Differential  TIME SPENT "FACE TO FACE" WITH PATIENT -  30 MINS

## 2013-10-30 NOTE — Patient Instructions (Signed)
1)  Back/Leg Pain - Continue on the Celebrex 200 mg, Cymbalta 30 mg daily and the Ultracet up to 4 x per day; Add the Lidoderm patch 12/24 hours; Start on the Medrol Dosepak and take for 6 days and also add some gabapentin - start with 1 capsule 300 mg at night and slowly increase to 3 capsules.  Pick up your MRI films from [facility] so you can take them with you to see Dr. Mina Marble. Your appointment is Wednesday 11/08/13 at 8:45 am.  They will call you if they have a cancellation.

## 2013-11-02 ENCOUNTER — Encounter: Payer: Self-pay | Admitting: Family Medicine

## 2013-11-02 ENCOUNTER — Other Ambulatory Visit: Payer: Self-pay | Admitting: Family Medicine

## 2013-11-02 DIAGNOSIS — M25569 Pain in unspecified knee: Secondary | ICD-10-CM

## 2013-11-02 DIAGNOSIS — E039 Hypothyroidism, unspecified: Secondary | ICD-10-CM

## 2013-11-02 MED ORDER — LEVOTHYROXINE SODIUM 112 MCG PO TABS: 112.0000 ug | ORAL_TABLET | Freq: Every day | ORAL | Status: AC

## 2013-11-02 MED ORDER — DULOXETINE HCL 60 MG PO CPEP: ORAL_CAPSULE | ORAL | Status: DC

## 2013-11-03 ENCOUNTER — Ambulatory Visit: Payer: Medicare Other | Admitting: Family Medicine

## 2013-11-16 ENCOUNTER — Encounter: Payer: Self-pay | Admitting: Family Medicine

## 2013-11-16 ENCOUNTER — Ambulatory Visit (INDEPENDENT_AMBULATORY_CARE_PROVIDER_SITE_OTHER): Payer: Medicare Other | Admitting: Family Medicine

## 2013-11-16 VITALS — BP 146/76 | HR 73

## 2013-11-16 DIAGNOSIS — R269 Unspecified abnormalities of gait and mobility: Secondary | ICD-10-CM

## 2013-11-16 DIAGNOSIS — R110 Nausea: Secondary | ICD-10-CM

## 2013-11-16 DIAGNOSIS — M549 Dorsalgia, unspecified: Secondary | ICD-10-CM

## 2013-11-16 DIAGNOSIS — S72412A Displaced unspecified condyle fracture of lower end of left femur, initial encounter for closed fracture: Secondary | ICD-10-CM

## 2013-11-16 DIAGNOSIS — S72413A Displaced unspecified condyle fracture of lower end of unspecified femur, initial encounter for closed fracture: Secondary | ICD-10-CM

## 2013-11-16 MED ORDER — KETOROLAC TROMETHAMINE 60 MG/2ML IM SOLN
60.0000 mg | Freq: Once | INTRAMUSCULAR | Status: AC
  Administered 2013-11-16: 60 mg via INTRAMUSCULAR

## 2013-11-16 MED ORDER — ONDANSETRON 8 MG PO TBDP: ORAL_TABLET | ORAL | Status: DC

## 2013-11-16 NOTE — Progress Notes (Signed)
Subjective:    Patient ID: Amber Knox, female    DOB: [birth year], 80 y.o.   MRN: [id number]  HPI:    [name] is here today with several of her family members (Melody, Derrell Lolling and Radcliffe) who are very concerned about her lack of mobility.  She has fallen four times over the past month with one fall resulting in a nondisplaced medial femoral condylar fracture.  She was seen by Dr. Onnie Graham with [facility] and was put in a knee immobilizer.   She has a walker but is really unable to use it because she can not put weight on her legs.  Her back, hips and left knee have been painful for several weeks and her right knee has also recently started to hurt which is most likely because she is not putting any weight on her left knee.  She lives alone and this has been a very big challenge for her and her family since she really needs help with activities of daily living.  She was seen at Dr. Lenn Cal office on Tuesday and received an injection in her back which she says has helped her pain.  Since she has fallen so many times, her family feels that she is afraid to do anything on her own.  They feel that she might benefit from physical and occupational therapy.  She will see Dr. Wilford Grist in 3 weeks for another injection.  She has not been taking the Cymbalta.  She says that she had heard that it was for fibromyalgia and she does not feel that she has this problem.       Review of Systems  Musculoskeletal: Positive for arthralgias, back pain and gait problem.  Neurological: Positive for weakness.     Past Medical History  Diagnosis Date  . DM type 2 (diabetes mellitus, type 2)   . Hyperlipemia   . Hypertension   . Peripheral neuropathy   . Osteopenia     Lumbar spine  . Hx of osteoarthritis     bilateral knee osteoarthritis  -   bilateral knee replacement  . Hiatal hernia   . GERD (gastroesophageal reflux disease)   . Kidney cysts     bilateral simple cysts  . Colon polyps     Tubular Adenoma   . Diverticulosis      Past Surgical History  Procedure Laterality Date  . Abdominal hysterectomy      excessive bleeding (IUD)  . 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 Vascular Disease Father     Cardio  . Diabetes type II Other   . Diabetes type II Other   . Breast cancer Paternal Aunt      Current Outpatient Prescriptions on File Prior to Visit  Medication Sig Dispense Refill  . allopurinol (ZYLOPRIM) 300 MG tablet Take 1 tablet (300 mg total) by mouth daily.  90 tablet  3  . celecoxib (CELEBREX) 200 MG capsule Take 1 capsule (200 mg total) by mouth daily.  30 capsule  11  . colestipol (COLESTID) 1 G tablet Take 2 tablets (2 g total) by mouth 2 (two) times daily.  360 tablet  3  . gabapentin (NEURONTIN) 300 MG capsule Take 1 capsule (300 mg total) by mouth 3 (three) times daily.  90 capsule  2  . levothyroxine (SYNTHROID, LEVOTHROID) 112 MCG tablet Take 1 tablet (112 mcg total) by mouth daily.  90 tablet  3  . lidocaine (LIDODERM) 5 % Apply up to 3 patches to painful areas up to 12/24 hours.  90 patch  11  . metFORMIN (GLUCOPHAGE) 1000 MG tablet Take 1 tablet (1,000 mg total) by mouth 2 (two) times daily with a meal.  90 tablet  3  . Olmesartan-Amlodipine-HCTZ (TRIBENZOR) 40-10-25 MG TABS Take 1 tablet by mouth daily.  90 tablet  3  . saxagliptin HCl (ONGLYZA) 5 MG TABS tablet Take 1 tablet (5 mg total) by mouth daily.  90 tablet  3  . traMADol-acetaminophen (ULTRACET) 37.5-325 MG  per tablet Take 1-2 tabs up to twice a day for pain  120 tablet  1  . aspirin 81 MG tablet Take 81 mg by mouth daily.      . colchicine 0.6 MG tablet Take 0.6 mg by mouth 2 (two) times daily.      . DULoxetine (CYMBALTA) 60 MG capsule Take 1 capsule daily with supper  30 capsule  5  . promethazine (PHENERGAN) 12.5 MG tablet Take 1/2 - 1 tab every 6 hours as needed for nausea  30 tablet  1   No current facility-administered medications on file prior to visit.     Allergies  Allergen Reactions  . Ace Inhibitors Cough     Immunization History  Administered Date(s) Administered  . Influenza,inj,Quad PF,36+ Mos 07/19/2013  . Pneumococcal Conjugate-13 08/17/2005  . Tdap 06/10/2011  . Zoster 02/13/2008      Objective:   Physical Exam  Vitals reviewed. Constitutional: She appears well-nourished. She is cooperative. No distress.  She is sitting in a wheelchair.    HENT:  Head: Normocephalic.  Eyes: No scleral icterus.  Neck: Neck supple.  Cardiovascular: Normal rate and regular rhythm.   Pulmonary/Chest: Effort normal and breath sounds normal.  Musculoskeletal: She exhibits tenderness. She exhibits no edema.  Neurological: She is alert.      Assessment & Plan:    Amber Knox was seen today for gait problem.  Diagnoses and associated orders for this visit:  Fracture of femoral condyle, left, closed Comments: Since Amber Knox is really unable to put weight on her left knee, I don't know how effective PT will be at home.    Nausea alone Comments: We discussed the fact that she might have nausea with Cymbalta.  She will take either Phenergan or Zofran depending on if she takes the Cymbalta with supper or at bedtime.  - ondansetron (ZOFRAN ODT) 8 MG disintegrating tablet; Take 1/2 - 1 tab daily with Cymbalta for nausea  Back pain Comments: She received an injection of Toradol and is to take the Cymbalta.  She has only taken one pill (30 mg) so she will start back on it.  She has a  prescription waiting for her at [facility].   - ketorolac (TORADOL) injection 60 mg; Inject 2 mLs (60 mg total) into the muscle once.  Abnormality of gait Comments: Since Amber Knox is unable to bear weight, I  feel that she should not be home and should be in a rehab center.  I contacted Dr. Tamsen Snider with the Rehab Program at [facility] and he recommended that she go into [place] for rehabilitation.    TIME SPENT "FACE TO FACE" WITH PATIENT -  30 MINS

## 2013-11-17 DIAGNOSIS — M549 Dorsalgia, unspecified: Secondary | ICD-10-CM | POA: Insufficient documentation

## 2013-11-17 DIAGNOSIS — R110 Nausea: Secondary | ICD-10-CM | POA: Insufficient documentation

## 2013-11-17 DIAGNOSIS — S72412A Displaced unspecified condyle fracture of lower end of left femur, initial encounter for closed fracture: Secondary | ICD-10-CM | POA: Insufficient documentation

## 2013-11-17 DIAGNOSIS — R269 Unspecified abnormalities of gait and mobility: Secondary | ICD-10-CM | POA: Insufficient documentation

## 2013-11-17 NOTE — Patient Instructions (Signed)
1)  Rehab - We will check to see if we can get you in a rehab program.    Knee Fracture, Adult A knee fracture is a break in any of the bones of the lower part of the thigh bone, the upper part of the bones of the lower leg, or of the kneecap. When the bones no longer meet the way they are supposed to it is called a dislocation. Sometimes there can be a dislocation along with fractures. SYMPTOMS  Symptoms may include pain, swelling, inability to bend the knee, deformity of the knee, and inability to walk.  DIAGNOSIS  This problem is usually diagnosed with x-rays. Special studies are sometimes done if a fracture is suspected but cannot be seen on ordinary x-rays. If vessels around the knee are injured, special tests may be done to see what the damage is. TREATMENT   The leg is usually splinted for the first couple of days to allow for swelling. After the swelling is down a cast is put on. Sometimes a cast is put on right away with the sides of the cast cut to allow the knee to swell. If the bones are in place, this may be all that is needed.  If the bones are out of place, medications for pain are given to allow them to be put back in place. If they are seriously out of place, surgery may be needed to hold the pieces or breaks in place using wires, pins, screws or metal plates.  Generally most fractures will heal in 4 to 6 weeks. HOME CARE INSTRUCTIONS   Use your crutches as directed.  To lessen the swelling, keep the injured leg elevated while sitting or lying down.  Apply ice to the injury for 15-20 minutes, 03-04 times per day while awake for 2 days. Put the ice in a plastic bag and place a thin towel between the bag of ice and your cast.  If you have a plaster or fiberglass cast:  Do not try to scratch the skin under the cast using sharp or pointed objects.  Check the skin around the cast every day. You may put lotion on any red or sore areas.  Keep your cast dry and clean.  If you  have a plaster splint:  Wear the splint as directed.  You may loosen the elastic around the splint if your toes become numb, tingle, or turn cold or blue.  Do not put pressure on any part of your cast or splint; it may break. Rest your cast only on a pillow the first 24 hours until it is fully hardened.  Your cast or splint can be protected during bathing with a plastic bag. Do not lower the cast or splint into water.  Only take over-the-counter or prescription medicines for pain, discomfort, or fever as directed by your caregiver.  See your caregiver soon if your cast gets damaged or breaks.  It is very important to keep all follow up appointments. Not following up as directed may result in a worsening of your condition or a failure of the fracture to heal properly. SEEK IMMEDIATE MEDICAL CARE IF:  You have continued severe pain.  You have more swelling than you did before the cast was put on.  The area below the fracture becomes painful.  Your skin or toenails below the injury turn blue or gray, or feel cold or numb.  There is drainage coming from under the cast.  New, unexplained symptoms develop (drugs used  in treatment may produce side effects). MAKE SURE YOU:   Understand these instructions.  Will watch your condition.  Will get help right away if you are not doing well or get worse. Document Released: 07/14/2006 Document Revised: 11/23/2011 Document Reviewed: 08/15/2007 [facility] Patient Information 2014 [place], [place].

## 2013-11-24 ENCOUNTER — Encounter: Payer: Self-pay | Admitting: *Deleted

## 2013-12-21 ENCOUNTER — Other Ambulatory Visit: Payer: Self-pay | Admitting: Neurosurgery

## 2013-12-25 ENCOUNTER — Encounter (HOSPITAL_COMMUNITY): Payer: Self-pay | Admitting: Pharmacy Technician

## 2013-12-26 DIAGNOSIS — R269 Unspecified abnormalities of gait and mobility: Secondary | ICD-10-CM

## 2013-12-26 DIAGNOSIS — E119 Type 2 diabetes mellitus without complications: Secondary | ICD-10-CM

## 2013-12-26 DIAGNOSIS — Z9181 History of falling: Secondary | ICD-10-CM

## 2013-12-28 ENCOUNTER — Encounter (HOSPITAL_COMMUNITY): Payer: Self-pay

## 2013-12-28 ENCOUNTER — Encounter (HOSPITAL_COMMUNITY)
Admission: RE | Admit: 2013-12-28 | Discharge: 2013-12-28 | Disposition: A | Discharge status: Home / Self Care | Payer: Medicare Other | Source: Ambulatory Visit | Attending: Neurosurgery | Admitting: Neurosurgery

## 2013-12-28 ENCOUNTER — Ambulatory Visit (HOSPITAL_COMMUNITY)
Admission: RE | Admit: 2013-12-28 | Discharge: 2013-12-28 | Disposition: A | Discharge status: Home / Self Care | Payer: Medicare Other | Source: Ambulatory Visit | Attending: Anesthesiology | Admitting: Anesthesiology

## 2013-12-28 DIAGNOSIS — E119 Type 2 diabetes mellitus without complications: Secondary | ICD-10-CM | POA: Insufficient documentation

## 2013-12-28 DIAGNOSIS — M949 Disorder of cartilage, unspecified: Secondary | ICD-10-CM

## 2013-12-28 DIAGNOSIS — M19019 Primary osteoarthritis, unspecified shoulder: Secondary | ICD-10-CM | POA: Insufficient documentation

## 2013-12-28 DIAGNOSIS — I10 Essential (primary) hypertension: Secondary | ICD-10-CM | POA: Insufficient documentation

## 2013-12-28 DIAGNOSIS — E785 Hyperlipidemia, unspecified: Secondary | ICD-10-CM | POA: Insufficient documentation

## 2013-12-28 DIAGNOSIS — E039 Hypothyroidism, unspecified: Secondary | ICD-10-CM | POA: Insufficient documentation

## 2013-12-28 DIAGNOSIS — M48061 Spinal stenosis, lumbar region without neurogenic claudication: Secondary | ICD-10-CM | POA: Insufficient documentation

## 2013-12-28 DIAGNOSIS — Z01812 Encounter for preprocedural laboratory examination: Secondary | ICD-10-CM | POA: Insufficient documentation

## 2013-12-28 DIAGNOSIS — M899 Disorder of bone, unspecified: Secondary | ICD-10-CM | POA: Insufficient documentation

## 2013-12-28 DIAGNOSIS — K219 Gastro-esophageal reflux disease without esophagitis: Secondary | ICD-10-CM | POA: Insufficient documentation

## 2013-12-28 HISTORY — DX: Hypothyroidism, unspecified: E03.9

## 2013-12-28 HISTORY — DX: Cardiac murmur, unspecified: R01.1

## 2013-12-28 LAB — BASIC METABOLIC PANEL
BUN: 29 mg/dL — ABNORMAL HIGH (ref 6–23)
CALCIUM: 9.2 mg/dL (ref 8.4–10.5)
CHLORIDE: 96 meq/L (ref 96–112)
CO2: 24 meq/L (ref 19–32)
CREATININE: 1.48 mg/dL — AB (ref 0.50–1.10)
GFR calc Af Amer: 37 mL/min — ABNORMAL LOW (ref >90)
GFR calc non Af Amer: 32 mL/min — ABNORMAL LOW (ref >90)
GLUCOSE: 121 mg/dL — AB (ref 70–99)
Potassium: 4.8 mEq/L (ref 3.7–5.3)
Sodium: 137 mEq/L (ref 137–147)

## 2013-12-28 LAB — CBC WITH DIFFERENTIAL/PLATELET
Basophils Absolute: 0 10*3/uL (ref 0.0–0.1)
Basophils Relative: 0 % (ref 0–1)
EOS ABS: 0.3 10*3/uL (ref 0.0–0.7)
Eosinophils Relative: 2 % (ref 0–5)
HEMATOCRIT: 36.6 % (ref 36.0–46.0)
Hemoglobin: 11.7 g/dL — ABNORMAL LOW (ref 12.0–15.0)
LYMPHS ABS: 4 10*3/uL (ref 0.7–4.0)
Lymphocytes Relative: 23 % (ref 12–46)
MCH: 29.6 pg (ref 26.0–34.0)
MCHC: 32 g/dL (ref 30.0–36.0)
MCV: 92.7 fL (ref 78.0–100.0)
MONO ABS: 1.1 10*3/uL — AB (ref 0.1–1.0)
Monocytes Relative: 7 % (ref 3–12)
Neutro Abs: 11.6 10*3/uL — ABNORMAL HIGH (ref 1.7–7.7)
Neutrophils Relative %: 68 % (ref 43–77)
PLATELETS: 234 10*3/uL (ref 150–400)
RBC: 3.95 MIL/uL (ref 3.87–5.11)
RDW: 14.9 % (ref 11.5–15.5)
WBC: 17 10*3/uL — ABNORMAL HIGH (ref 4.0–10.5)

## 2013-12-28 LAB — SURGICAL PCR SCREEN
MRSA, PCR: NEGATIVE
Staphylococcus aureus: NEGATIVE

## 2013-12-28 NOTE — Pre-Procedure Instructions (Addendum)
Amber Knox  12/28/2013   Your procedure is scheduled on: Monday, April 20.  Report to [facility], Main Entrance or Entrance "A" at 5:45 AM.  Call this number if you have problems the morning of surgery: [phone].   Remember:   Do not eat food or drink liquids after midnight.   Take these medicines the morning of surgery with A SIP OF WATER:gabapentin (NEURONTIN), levothyroxine (SYNTHROID, LEVOTHROID.               Take if needed: HYDROcodone-acetaminophen (NORCO/VICODIN) or traMADol (ULTRAM),   promethazine (PHENERGAN) or ondansetron (ZOFRAN).                Stop taking celecoxib (CELEBREX).                          Do not wear jewelry, make-up or nail polish.  Do not wear lotions, powders, or perfumes.   Do not shave 48 hours prior to surgery.   Do not bring valuables to the hospital.  [facility] is not responsible  for any belongings or valuables.               Contacts, dentures or bridgework may not be worn into surgery.  Leave suitcase in the car. After surgery it may be brought to your room.  For patients admitted to the hospital, discharge time is determined by your  treatment team.               Patients discharged the day of surgery will not be allowed to drive home.  Name and phone number of your driver: -     Special Instructions: Review  [place] - Preparing For Surgery.   Please read over the following fact sheets that you were given:  Pain Management, Coughing and Deep Breathing, Preparing for Surgery,Surgical Site Infections.

## 2013-12-28 NOTE — Progress Notes (Signed)
WBC 17, Vanessa at Dr Annette Stable notified.

## 2013-12-28 NOTE — Progress Notes (Signed)
Amber Knox does not see a cardiologist and does not c/o any chest pain.  Patient reports a reaction to a medication after a surgery she had in [facility], maybe in '01.   I felt like I was on fire, "I think it was Morphine".  Patients daughter who is a nurse said, "No it was not Morphine, it was an epidural. "  I  Faxed a request to [facility] for d/c notes from '01 and they did not have any.  I faxed another request for any d/c surgery notes.

## 2013-12-29 ENCOUNTER — Encounter (HOSPITAL_COMMUNITY): Payer: Self-pay

## 2013-12-29 ENCOUNTER — Encounter (HOSPITAL_COMMUNITY): Payer: Self-pay | Admitting: Vascular Surgery

## 2013-12-29 NOTE — Progress Notes (Signed)
Anesthesia Chart Review:  Patient is an 80 year old female scheduled for right L2-3, right L4-5 microdiscectomy on 01/01/14 by Dr. Annette Stable.  History includes nonsmoker, diabetes mellitus type 2, hyperlipidemia, hypertension, osteoarthritis, osteopenia, hypothyroidism, diverticulosis, peripheral neuropathy, hiatal hernia, GERD, arthritis, murmur (diagnosed > 10 years ago; reported unremarkable echo and was told "no need to follow"), reported "clean" cath in [facility]; done for chest pain but was told pain was related to hiatal hernia), bilateral TKA, tonsillectomy, hysterectomy, cholecystectomy. BMI is 46 consistent with morbid obesity. PCP is listed as Dr. Ival Bible.  For her anesthesia history she reported family members slow to awaken from anesthesia. She also reported a reaction to medication after TKA at [facility] ~ 2001.  She believes that she was on a Morphine drip/PCA and became extremely hot--so much so that her son had to pour water with ice chips over her.  (Her daughter thought it may have been related to something in the epidural.)  [facility] records requested as available.   EKG on 4//16/15 showed SB at 52 bpm, LAD, inferior infarct (age undetermined), anterior infarct (age undetermined).  (Update: Old EKG from [facility] from 08/01/09 obtained, and it showed NSR, inferior infarct and anterolateral infarct (age undetermined).)   Chest x-ray on 12/28/13 showed no acute abnormality.  Preoperative labs noted.  BUN/Cr 29/1.48.  WBC 17.0.  H/H 11.7/36.6. Glucose 121.  A1C on 07/19/13 was 5.9. Elevated WBC already called to Dr. Marchelle Folks office.  Per Lorriane Shire, he gave no additional recommendations.  She was afebrile with no infiltrates on CXR at PAT.  I reviewed EKG and history with anesthesiologist Dr. Tamala Julian. Will attempt to obtain a comparison EKG, but otherwise if patient asymptomatic from a CV standpoint then would anticipate that she can proceed as planned. I did call and speak with  patient.  She denied chest pain, SOB, edema.  Up until her back pain became more severe, she was gardening, fishing, driving, walking in the stores for ~ 30 minutes without CV symptoms. Further evaluation by her assigned anesthesiologist on the day of surgery, and if no acute changes then it is anticipated that she can proceed as planned.  (Update: Old EKG from [facility] from 08/01/09 obtained, and it also showed NSR, inferior infarct and anterolateral infarct (age undetermined).)   Myra Gianotti, PA-C [facility] Short Stay Center/Anesthesiology Phone [phone] 12/29/2013 4:04 PM

## 2013-12-31 ENCOUNTER — Emergency Department (HOSPITAL_COMMUNITY): Payer: Medicare Other

## 2013-12-31 ENCOUNTER — Encounter (HOSPITAL_COMMUNITY): Payer: Self-pay | Admitting: Emergency Medicine

## 2013-12-31 ENCOUNTER — Inpatient Hospital Stay (HOSPITAL_COMMUNITY)
Admission: EM | Admit: 2013-12-31 | Discharge: 2014-01-05 | DRG: 175 | Disposition: A | Discharge status: Skilled Nursing Facility | Payer: Medicare Other | Attending: Internal Medicine | Admitting: Internal Medicine

## 2013-12-31 ENCOUNTER — Other Ambulatory Visit: Payer: Self-pay

## 2013-12-31 DIAGNOSIS — S72412A Displaced unspecified condyle fracture of lower end of left femur, initial encounter for closed fracture: Secondary | ICD-10-CM

## 2013-12-31 DIAGNOSIS — M79604 Pain in right leg: Secondary | ICD-10-CM

## 2013-12-31 DIAGNOSIS — C799 Secondary malignant neoplasm of unspecified site: Secondary | ICD-10-CM

## 2013-12-31 DIAGNOSIS — R6520 Severe sepsis without septic shock: Secondary | ICD-10-CM

## 2013-12-31 DIAGNOSIS — I2699 Other pulmonary embolism without acute cor pulmonale: Secondary | ICD-10-CM

## 2013-12-31 DIAGNOSIS — N281 Cyst of kidney, acquired: Secondary | ICD-10-CM

## 2013-12-31 DIAGNOSIS — M519 Unspecified thoracic, thoracolumbar and lumbosacral intervertebral disc disorder: Secondary | ICD-10-CM

## 2013-12-31 DIAGNOSIS — M549 Dorsalgia, unspecified: Secondary | ICD-10-CM

## 2013-12-31 DIAGNOSIS — E1149 Type 2 diabetes mellitus with other diabetic neurological complication: Secondary | ICD-10-CM | POA: Diagnosis present

## 2013-12-31 DIAGNOSIS — I82409 Acute embolism and thrombosis of unspecified deep veins of unspecified lower extremity: Secondary | ICD-10-CM | POA: Diagnosis present

## 2013-12-31 DIAGNOSIS — E119 Type 2 diabetes mellitus without complications: Secondary | ICD-10-CM

## 2013-12-31 DIAGNOSIS — Z6841 Body Mass Index (BMI) 40.0 and over, adult: Secondary | ICD-10-CM

## 2013-12-31 DIAGNOSIS — Z803 Family history of malignant neoplasm of breast: Secondary | ICD-10-CM

## 2013-12-31 DIAGNOSIS — E871 Hypo-osmolality and hyponatremia: Secondary | ICD-10-CM | POA: Diagnosis present

## 2013-12-31 DIAGNOSIS — A419 Sepsis, unspecified organism: Secondary | ICD-10-CM | POA: Diagnosis not present

## 2013-12-31 DIAGNOSIS — N19 Unspecified kidney failure: Secondary | ICD-10-CM

## 2013-12-31 DIAGNOSIS — E279 Disorder of adrenal gland, unspecified: Secondary | ICD-10-CM | POA: Diagnosis present

## 2013-12-31 DIAGNOSIS — Z9849 Cataract extraction status, unspecified eye: Secondary | ICD-10-CM

## 2013-12-31 DIAGNOSIS — E872 Acidosis, unspecified: Secondary | ICD-10-CM | POA: Diagnosis present

## 2013-12-31 DIAGNOSIS — M949 Disorder of cartilage, unspecified: Secondary | ICD-10-CM

## 2013-12-31 DIAGNOSIS — Z8739 Personal history of other diseases of the musculoskeletal system and connective tissue: Secondary | ICD-10-CM

## 2013-12-31 DIAGNOSIS — K635 Polyp of colon: Secondary | ICD-10-CM

## 2013-12-31 DIAGNOSIS — Z66 Do not resuscitate: Secondary | ICD-10-CM | POA: Diagnosis not present

## 2013-12-31 DIAGNOSIS — J9600 Acute respiratory failure, unspecified whether with hypoxia or hypercapnia: Secondary | ICD-10-CM | POA: Diagnosis present

## 2013-12-31 DIAGNOSIS — G609 Hereditary and idiopathic neuropathy, unspecified: Secondary | ICD-10-CM | POA: Diagnosis present

## 2013-12-31 DIAGNOSIS — E039 Hypothyroidism, unspecified: Secondary | ICD-10-CM | POA: Diagnosis present

## 2013-12-31 DIAGNOSIS — Z7982 Long term (current) use of aspirin: Secondary | ICD-10-CM

## 2013-12-31 DIAGNOSIS — D649 Anemia, unspecified: Secondary | ICD-10-CM

## 2013-12-31 DIAGNOSIS — R579 Shock, unspecified: Secondary | ICD-10-CM | POA: Diagnosis present

## 2013-12-31 DIAGNOSIS — K449 Diaphragmatic hernia without obstruction or gangrene: Secondary | ICD-10-CM | POA: Diagnosis present

## 2013-12-31 DIAGNOSIS — K5790 Diverticulosis of intestine, part unspecified, without perforation or abscess without bleeding: Secondary | ICD-10-CM

## 2013-12-31 DIAGNOSIS — E1142 Type 2 diabetes mellitus with diabetic polyneuropathy: Secondary | ICD-10-CM | POA: Diagnosis present

## 2013-12-31 DIAGNOSIS — N179 Acute kidney failure, unspecified: Secondary | ICD-10-CM | POA: Diagnosis present

## 2013-12-31 DIAGNOSIS — M8580 Other specified disorders of bone density and structure, unspecified site: Secondary | ICD-10-CM

## 2013-12-31 DIAGNOSIS — E869 Volume depletion, unspecified: Secondary | ICD-10-CM | POA: Diagnosis present

## 2013-12-31 DIAGNOSIS — Z860100 Personal history of colon polyps, unspecified: Secondary | ICD-10-CM

## 2013-12-31 DIAGNOSIS — R269 Unspecified abnormalities of gait and mobility: Secondary | ICD-10-CM

## 2013-12-31 DIAGNOSIS — I10 Essential (primary) hypertension: Secondary | ICD-10-CM | POA: Diagnosis present

## 2013-12-31 DIAGNOSIS — R911 Solitary pulmonary nodule: Secondary | ICD-10-CM | POA: Diagnosis present

## 2013-12-31 DIAGNOSIS — I2692 Saddle embolus of pulmonary artery without acute cor pulmonale: Principal | ICD-10-CM | POA: Diagnosis present

## 2013-12-31 DIAGNOSIS — M25569 Pain in unspecified knee: Secondary | ICD-10-CM

## 2013-12-31 DIAGNOSIS — Z833 Family history of diabetes mellitus: Secondary | ICD-10-CM

## 2013-12-31 DIAGNOSIS — C801 Malignant (primary) neoplasm, unspecified: Secondary | ICD-10-CM | POA: Diagnosis present

## 2013-12-31 DIAGNOSIS — E875 Hyperkalemia: Secondary | ICD-10-CM | POA: Diagnosis present

## 2013-12-31 DIAGNOSIS — Q762 Congenital spondylolisthesis: Secondary | ICD-10-CM

## 2013-12-31 DIAGNOSIS — G629 Polyneuropathy, unspecified: Secondary | ICD-10-CM

## 2013-12-31 DIAGNOSIS — R110 Nausea: Secondary | ICD-10-CM

## 2013-12-31 DIAGNOSIS — E785 Hyperlipidemia, unspecified: Secondary | ICD-10-CM | POA: Diagnosis present

## 2013-12-31 DIAGNOSIS — Z96659 Presence of unspecified artificial knee joint: Secondary | ICD-10-CM

## 2013-12-31 DIAGNOSIS — M47817 Spondylosis without myelopathy or radiculopathy, lumbosacral region: Secondary | ICD-10-CM | POA: Diagnosis present

## 2013-12-31 DIAGNOSIS — C7970 Secondary malignant neoplasm of unspecified adrenal gland: Secondary | ICD-10-CM

## 2013-12-31 DIAGNOSIS — Z515 Encounter for palliative care: Secondary | ICD-10-CM

## 2013-12-31 DIAGNOSIS — M899 Disorder of bone, unspecified: Secondary | ICD-10-CM | POA: Diagnosis present

## 2013-12-31 DIAGNOSIS — K219 Gastro-esophageal reflux disease without esophagitis: Secondary | ICD-10-CM | POA: Diagnosis present

## 2013-12-31 HISTORY — DX: Acute embolism and thrombosis of unspecified deep veins of unspecified lower extremity: I82.409

## 2013-12-31 HISTORY — DX: Other pulmonary embolism without acute cor pulmonale: I26.99

## 2013-12-31 LAB — CBC WITH DIFFERENTIAL/PLATELET
BASOS ABS: 0 10*3/uL (ref 0.0–0.1)
BASOS PCT: 0 % (ref 0–1)
EOS ABS: 0 10*3/uL (ref 0.0–0.7)
Eosinophils Relative: 0 % (ref 0–5)
HCT: 36.5 % (ref 36.0–46.0)
Hemoglobin: 11.4 g/dL — ABNORMAL LOW (ref 12.0–15.0)
LYMPHS PCT: 13 % (ref 12–46)
Lymphs Abs: 2.2 10*3/uL (ref 0.7–4.0)
MCH: 29.2 pg (ref 26.0–34.0)
MCHC: 31.2 g/dL (ref 30.0–36.0)
MCV: 93.6 fL (ref 78.0–100.0)
MONO ABS: 0.9 10*3/uL (ref 0.1–1.0)
Monocytes Relative: 5 % (ref 3–12)
Neutro Abs: 13.9 10*3/uL — ABNORMAL HIGH (ref 1.7–7.7)
Neutrophils Relative %: 82 % — ABNORMAL HIGH (ref 43–77)
PLATELETS: 206 10*3/uL (ref 150–400)
RBC: 3.9 MIL/uL (ref 3.87–5.11)
RDW: 15 % (ref 11.5–15.5)
WBC: 17.1 10*3/uL — AB (ref 4.0–10.5)

## 2013-12-31 LAB — COMPREHENSIVE METABOLIC PANEL
ALBUMIN: 3.4 g/dL — AB (ref 3.5–5.2)
ALT: 30 U/L (ref 0–35)
AST: 30 U/L (ref 0–37)
Alkaline Phosphatase: 70 U/L (ref 39–117)
BUN: 43 mg/dL — ABNORMAL HIGH (ref 6–23)
CALCIUM: 9 mg/dL (ref 8.4–10.5)
CO2: 17 meq/L — AB (ref 19–32)
CREATININE: 2.8 mg/dL — AB (ref 0.50–1.10)
Chloride: 97 mEq/L (ref 96–112)
GFR calc Af Amer: 17 mL/min — ABNORMAL LOW (ref >90)
GFR, EST NON AFRICAN AMERICAN: 15 mL/min — AB (ref >90)
Glucose, Bld: 168 mg/dL — ABNORMAL HIGH (ref 70–99)
Potassium: 6.1 mEq/L — ABNORMAL HIGH (ref 3.7–5.3)
SODIUM: 133 meq/L — AB (ref 137–147)
Total Bilirubin: 0.3 mg/dL (ref 0.3–1.2)
Total Protein: 6.7 g/dL (ref 6.0–8.3)

## 2013-12-31 LAB — BASIC METABOLIC PANEL
BUN: 38 mg/dL — AB (ref 6–23)
CO2: 18 mEq/L — ABNORMAL LOW (ref 19–32)
Calcium: 7.7 mg/dL — ABNORMAL LOW (ref 8.4–10.5)
Chloride: 102 mEq/L (ref 96–112)
Creatinine, Ser: 2.28 mg/dL — ABNORMAL HIGH (ref 0.50–1.10)
GFR calc non Af Amer: 19 mL/min — ABNORMAL LOW (ref >90)
GFR, EST AFRICAN AMERICAN: 22 mL/min — AB (ref >90)
Glucose, Bld: 108 mg/dL — ABNORMAL HIGH (ref 70–99)
POTASSIUM: 5.5 meq/L — AB (ref 3.7–5.3)
Sodium: 134 mEq/L — ABNORMAL LOW (ref 137–147)

## 2013-12-31 LAB — I-STAT ARTERIAL BLOOD GAS, ED
ACID-BASE DEFICIT: 7 mmol/L — AB (ref 0.0–2.0)
Bicarbonate: 20.3 mEq/L (ref 20.0–24.0)
O2 SAT: 99 %
PCO2 ART: 47.5 mmHg — AB (ref 35.0–45.0)
Patient temperature: 98.6
TCO2: 22 mmol/L (ref 0–100)
pH, Arterial: 7.238 — ABNORMAL LOW (ref 7.350–7.450)
pO2, Arterial: 172 mmHg — ABNORMAL HIGH (ref 80.0–100.0)

## 2013-12-31 LAB — GLUCOSE, CAPILLARY: Glucose-Capillary: 104 mg/dL — ABNORMAL HIGH (ref 70–99)

## 2013-12-31 LAB — I-STAT CHEM 8, ED
BUN: 41 mg/dL — ABNORMAL HIGH (ref 6–23)
Calcium, Ion: 1.09 mmol/L — ABNORMAL LOW (ref 1.13–1.30)
Chloride: 99 mEq/L (ref 96–112)
Creatinine, Ser: 3 mg/dL — ABNORMAL HIGH (ref 0.50–1.10)
Glucose, Bld: 168 mg/dL — ABNORMAL HIGH (ref 70–99)
HCT: 39 % (ref 36.0–46.0)
HEMOGLOBIN: 13.3 g/dL (ref 12.0–15.0)
Potassium: 5.8 mEq/L — ABNORMAL HIGH (ref 3.7–5.3)
SODIUM: 130 meq/L — AB (ref 137–147)
TCO2: 21 mmol/L (ref 0–100)

## 2013-12-31 LAB — URINALYSIS, ROUTINE W REFLEX MICROSCOPIC
Glucose, UA: NEGATIVE mg/dL
Hgb urine dipstick: NEGATIVE
KETONES UR: 15 mg/dL — AB
LEUKOCYTES UA: NEGATIVE
Nitrite: NEGATIVE
PROTEIN: NEGATIVE mg/dL
Specific Gravity, Urine: 1.026 (ref 1.005–1.030)
Urobilinogen, UA: 0.2 mg/dL (ref 0.0–1.0)
pH: 5 (ref 5.0–8.0)

## 2013-12-31 LAB — I-STAT CG4 LACTIC ACID, ED: Lactic Acid, Venous: 4.16 mmol/L — ABNORMAL HIGH (ref 0.5–2.2)

## 2013-12-31 LAB — I-STAT TROPONIN, ED: Troponin i, poc: 0.21 ng/mL (ref 0.00–0.08)

## 2013-12-31 LAB — PROCALCITONIN: Procalcitonin: 0.17 ng/mL

## 2013-12-31 LAB — TROPONIN I: TROPONIN I: 0.39 ng/mL — AB (ref <0.30)

## 2013-12-31 LAB — MRSA PCR SCREENING: MRSA by PCR: NEGATIVE

## 2013-12-31 MED ORDER — SODIUM CHLORIDE 0.9 % IV BOLUS (SEPSIS)
1000.0000 mL | Freq: Once | INTRAVENOUS | Status: AC
  Administered 2013-12-31: 1000 mL via INTRAVENOUS

## 2013-12-31 MED ORDER — ACETAMINOPHEN 10 MG/ML IV SOLN
1000.0000 mg | Freq: Four times a day (QID) | INTRAVENOUS | Status: AC
  Administered 2013-12-31 – 2014-01-01 (×4): 1000 mg via INTRAVENOUS
  Filled 2013-12-31 (×5): qty 100

## 2013-12-31 MED ORDER — PIPERACILLIN-TAZOBACTAM 3.375 G IVPB 30 MIN
3.3750 g | Freq: Once | INTRAVENOUS | Status: AC
  Administered 2013-12-31: 3.375 g via INTRAVENOUS
  Filled 2013-12-31: qty 50

## 2013-12-31 MED ORDER — SODIUM CHLORIDE 0.9 % IV SOLN
INTRAVENOUS | Status: DC
  Administered 2013-12-31: 100 mL/h via INTRAVENOUS
  Administered 2014-01-02: 23:00:00 via INTRAVENOUS

## 2013-12-31 MED ORDER — SODIUM CHLORIDE 0.9 % IV SOLN: 250.0000 mL | INTRAVENOUS | Status: DC | PRN

## 2013-12-31 MED ORDER — SODIUM BICARBONATE 8.4 % IV SOLN
50.0000 meq | Freq: Once | INTRAVENOUS | Status: AC
  Administered 2013-12-31: 50 meq via INTRAVENOUS
  Filled 2013-12-31: qty 50

## 2013-12-31 MED ORDER — HEPARIN (PORCINE) IN NACL 100-0.45 UNIT/ML-% IJ SOLN
1400.0000 [IU]/h | INTRAMUSCULAR | Status: DC
  Administered 2013-12-31: 1200 [IU]/h via INTRAVENOUS
  Administered 2014-01-01: 1400 [IU]/h via INTRAVENOUS
  Filled 2013-12-31 (×3): qty 250

## 2013-12-31 MED ORDER — HEPARIN BOLUS VIA INFUSION
4000.0000 [IU] | Freq: Once | INTRAVENOUS | Status: AC
  Administered 2013-12-31: 4000 [IU] via INTRAVENOUS
  Filled 2013-12-31: qty 4000

## 2013-12-31 MED ORDER — FAMOTIDINE IN NACL 20-0.9 MG/50ML-% IV SOLN
20.0000 mg | Freq: Two times a day (BID) | INTRAVENOUS | Status: DC
Start: 2013-12-31 — End: 2014-01-01
  Administered 2013-12-31 – 2014-01-01 (×2): 20 mg via INTRAVENOUS
  Filled 2013-12-31 (×2): qty 50

## 2013-12-31 MED ORDER — CEFAZOLIN SODIUM-DEXTROSE 2-3 GM-% IV SOLR: 2.0000 g | INTRAVENOUS | Status: DC

## 2013-12-31 MED ORDER — LEVOTHYROXINE SODIUM 100 MCG IV SOLR
50.0000 ug | Freq: Every day | INTRAVENOUS | Status: DC
  Administered 2014-01-01 – 2014-01-03 (×3): 50 ug via INTRAVENOUS
  Filled 2013-12-31 (×4): qty 5

## 2013-12-31 MED ORDER — INSULIN ASPART 100 UNIT/ML ~~LOC~~ SOLN
2.0000 [IU] | SUBCUTANEOUS | Status: DC
  Administered 2014-01-01 (×2): 2 [IU] via SUBCUTANEOUS

## 2013-12-31 MED ORDER — DEXAMETHASONE SODIUM PHOSPHATE 10 MG/ML IJ SOLN: 10.0000 mg | INTRAMUSCULAR | Status: DC

## 2013-12-31 MED ORDER — VANCOMYCIN HCL IN DEXTROSE 1-5 GM/200ML-% IV SOLN
1000.0000 mg | Freq: Once | INTRAVENOUS | Status: AC
  Administered 2013-12-31: 1000 mg via INTRAVENOUS
  Filled 2013-12-31: qty 200

## 2013-12-31 NOTE — ED Notes (Signed)
Code sepsis called.

## 2013-12-31 NOTE — H&P (Signed)
PULMONARY / CRITICAL CARE MEDICINE   Name: Amber Knox MRN: [id number] DOB: 1929-11-02    ADMISSION DATE:  12/31/2013 CONSULTATION DATE:  12/31/2013  REFERRING MD : EDP PRIMARY SERVICE: PCCM  CHIEF COMPLAINT:  Hypotension   BRIEF PATIENT DESCRIPTION:  83-yo-female with pmh T2DM, HTN, HL, hypothyroidism, OA, osteopenia, lumbar back pain scheduled for right L2-3, right L4-5 microdiscectomy on 4/20, who presented with hypotension and hypoxia found to have massive saddle pulmonary embolus with probable right heart strain.     SIGNIFICANT EVENTS / STUDIES:  4/19 Admitted with massive saddle pulmonary embolus, started on IV heparin 4/19 CT chest w/o cont  with massive saddle embolus extending into the lobar and segmental sized branches of both lungs with likely developing right heart strain. Osteolytic lesion of manubrium, 3.8 cm right adrenal mass, 6 mm nodule in left lower pulmonary lobe    LINES / TUBES: PIV 4/9 >> Foley Catheter 4/19  CULTURES: Blood cultures 4/19 >>   ANTIBIOTICS: Vancomycin 4/19>>4/19 Zosyn 4/19 >>4/19  HISTORY OF PRESENT ILLNESS:    [name] is a 83-yo-female with pmh T2DM, HTN, HL, hypothyroidism, OA, osteopenia,  lumbar back pain scheduled for right L2-3, right L4-5 microdiscectomy on 4/20 who presented with 1 day history of lightheadedness, weakness, and decreased urine output. Per family pt was feeling her normal self until this morning when she began feeling "drunk" and lightheaded with weakness and fatigue. Pt has been having 3 month history of low back pain and was in rehab facility from March 13-April 9 with immobilization and inability to ambulate with right leg leg pain since being home.  Pt was scheduled for right L2-3, right L4-5 microdiscectomy  tomorrow and underwent pre-op evaluation on 4/16 that was normal. She reports no shortness of breath, cough, URI symptoms, fevers, or sick contacts. She also denies chest pain, tachycardia, hemoptysis,  or syncope. She has been having decreased appetite for some time without reported weight change.  She reports no history of blood clots.   In the ED pt was found to have blood pressure of 75/54 with pO2 of 80% on RA with CT chest w/o contrast findings  of massive saddle pulmonary embolus. Pt was given IVF's and supplemental oxygen (4L via NRB) in the ED with improvement of blood pressure and hypoxia.    PAST MEDICAL HISTORY :  Past Medical History  Diagnosis Date  . DM type 2 (diabetes mellitus, type 2)   . Hyperlipemia   . Hypertension   . Osteopenia     Lumbar spine  . Hx of osteoarthritis     bilateral knee osteoarthritis  -   bilateral knee replacement  . Colon polyps     Tubular Adenoma  . Diverticulosis     patient unaware   . Family history of anesthesia complication     Daughter, Son and Yolanda Bonine - slow to awaken  . Peripheral neuropathy   . Hiatal hernia   . Heart murmur     no need to follow  . GERD (gastroesophageal reflux disease)     not on meds, tums prn  . Arthritis   . Hypothyroidism    Past Surgical History  Procedure Laterality Date  . Abdominal hysterectomy      excessive bleeding (IUD)  . Cataract extraction Right   . Cholecystectomy    . Joint replacement    . Cardiac catheterization  1995    [facility] - clean  . Eye surgery Right     Cataract  .  Knee arthroplasty Bilateral   . Tonsillectomy    . Carpal tunnel release Right    Prior to Admission medications   Medication Sig Start Date End Date Taking? Authorizing Provider  allopurinol (ZYLOPRIM) 300 MG tablet Take 1 tablet (300 mg total) by mouth daily. 07/19/13 07/19/14 Yes Jonathon Resides, MD  aspirin 81 MG tablet Take 81 mg by mouth daily.   Yes Historical Provider, MD  celecoxib (CELEBREX) 200 MG capsule Take 1 capsule (200 mg total) by mouth daily. 10/23/13 10/23/14 Yes Jonathon Resides, MD  colestipol (COLESTID) 1 G tablet Take 2 tablets (2 g total) by mouth 2 (two) times daily. 07/19/13 07/19/14 Yes Jonathon Resides, MD  gabapentin (NEURONTIN) 300 MG capsule Take 1 capsule (300 mg total) by mouth 3 (three) times daily. 10/30/13  Yes Jonathon Resides, MD  HYDROcodone-acetaminophen (NORCO/VICODIN) 5-325 MG per tablet Take 1 tablet by mouth every 6 (six) hours as needed for moderate pain.   Yes Historical Provider, MD  levothyroxine (SYNTHROID, LEVOTHROID) 112 MCG tablet Take 1 tablet (112 mcg total) by mouth daily. 11/02/13 11/02/14 Yes Jonathon Resides, MD  lidocaine (LIDODERM) 5 % Place 1-3 patches onto the skin daily. 10/23/13 10/23/14 Yes Jonathon Resides, MD  metFORMIN (GLUCOPHAGE) 1000 MG tablet Take 1 tablet (1,000 mg total) by mouth 2 (two) times daily with a meal. 07/28/13 07/28/14 Yes Jonathon Resides, MD  Olmesartan-Amlodipine-HCTZ (TRIBENZOR) 40-10-25 MG TABS Take 1 tablet by mouth daily. 07/19/13 07/19/14 Yes Jonathon Resides, MD  ondansetron (ZOFRAN-ODT) 8 MG disintegrating tablet Take 8 mg by mouth every 8 (eight) hours as needed for nausea.  11/16/13 11/17/14 Yes Jonathon Resides, MD  promethazine (PHENERGAN) 12.5 MG tablet Take 12.5 mg by mouth every 6 (six) hours as needed for nausea.  10/23/13 10/23/14 Yes Jonathon Resides, MD  saxagliptin HCl (ONGLYZA) 5 MG TABS tablet Take 1 tablet (5 mg total) by mouth daily. 07/19/13 07/19/14 Yes Jonathon Resides, MD  tiZANidine (ZANAFLEX) 4 MG tablet Take 4 mg by mouth every 6 (six) hours as needed for muscle spasms.   Yes Historical Provider, MD  traMADol (ULTRAM) 50 MG tablet Take 50 mg by mouth every 6 (six) hours as needed for moderate pain.   Yes Historical Provider, MD   Allergies  Allergen Reactions  . Ace Inhibitors Cough  . Byetta 10 Mcg Pen [Exenatide] Other (See Comments)    Pancreatitis 02/2005    FAMILY HISTORY:  Family History  Problem Relation Age of Onset  . Vascular Disease Father     Cardio  . Diabetes type II Other   . Diabetes type II Other   . Breast cancer Paternal Aunt    SOCIAL HISTORY:  reports that she has never smoked. She has never used  smokeless tobacco. She reports that she does not drink alcohol or use illicit drugs.  REVIEW OF SYSTEMS:   Review of Systems  Constitutional: Positive for fever, malaise/fatigue and diaphoresis. Negative for chills and weight loss.  HENT: Negative for congestion and sore throat.   Eyes: Positive for blurred vision (resolved).  Respiratory: Positive for wheezing (resolved). Negative for cough, hemoptysis, sputum production and shortness of breath.   Cardiovascular: Positive for leg swelling. Negative for chest pain and palpitations.  Gastrointestinal: Positive for abdominal pain (chronic right sided) and diarrhea (chronic). Negative for nausea, vomiting and constipation.  Musculoskeletal: Positive for back pain (chronic, worse in past 3 months). Negative for falls.  Right leg pain  Skin: Negative for rash.  Neurological: Positive for dizziness, speech change and weakness. Negative for focal weakness, loss of consciousness and headaches.     VITAL SIGNS: Temp:  [98.1 F (36.7 C)-100.2 F (37.9 C)] 100.2 F (37.9 C) (04/19 1708) Pulse Rate:  [54-63] 63 (04/19 1840) Resp:  [15-20] 18 (04/19 1840) BP: (75-100)/(35-54) 100/46 mmHg (04/19 1840) SpO2:  [90 %-100 %] 99 % (04/19 1840) Weight:  [109.317 kg (241 lb)] 109.317 kg (241 lb) (04/19 1621) HEMODYNAMICS: CV stable at present   VENTILATOR SETTINGS: 50% VM sats 95%   INTAKE / OUTPUT: Intake/Output   None     PHYSICAL EXAMINATION: General:  Obese woman lying in bed on NRB Neuro:  A & O x 3, no focal deficits  HEENT:  Normocephalic, nontraumatic, dry mucous membranes Cardiovascular: distant heart sounds, normal rate and rhythm, systolic murmur Lungs:  Clear to ausculation bilaterally. No wheezing, ronchi, or rales Abdomen:  Soft, non-distended, tenderness to palpation of right middle quadrant, normal BS, no peritoneal signs  Musculoskeletal:  Moving all extremities, b/l LE +1 edema, tenderness to palpation of right LE up  to thigh, Homan's sign positive  Skin:  Warm, dry  LABS:  CBC  Recent Labs Lab 12/28/13 0942 12/31/13 1705 12/31/13 1729  WBC 17.0* 17.1*  --   HGB 11.7* 11.4* 13.3  HCT 36.6 36.5 39.0  PLT 234 206  --    Coag's No results found for this basename: APTT, INR,  in the last 168 hours BMET  Recent Labs Lab 12/28/13 0942 12/31/13 1705 12/31/13 1729  NA 137 133* 130*  K 4.8 6.1* 5.8*  CL 96 97 99  CO2 24 17*  --   BUN 29* 43* 41*  CREATININE 1.48* 2.80* 3.00*  GLUCOSE 121* 168* 168*   Electrolytes  Recent Labs Lab 12/28/13 0942 12/31/13 1705  CALCIUM 9.2 9.0   Sepsis Markers  Recent Labs Lab 12/31/13 1730  LATICACIDVEN 4.16*   ABG No results found for this basename: PHART, PCO2ART, PO2ART,  in the last 168 hours Liver Enzymes  Recent Labs Lab 12/31/13 1705  AST 30  ALT 30  ALKPHOS 70  BILITOT 0.3  ALBUMIN 3.4*   Cardiac Enzymes No results found for this basename: TROPONINI, PROBNP,  in the last 168 hours Glucose No results found for this basename: GLUCAP,  in the last 168 hours  Imaging Dg Chest Port 1 View  12/31/2013   CLINICAL DATA:  Hypoxia.  Fatigue.  EXAM: PORTABLE CHEST - 1 VIEW  COMPARISON:  Chest x-ray 12/28/2013.  FINDINGS: There is a masslike appearance of the right infrahilar region. Lungs otherwise appear clear. No pleural effusions. No evidence of pulmonary edema. Mild cardiomegaly discretion borderline cardiomegaly, likely accentuated by portable AP technique. Atherosclerosis in the thoracic aorta.  IMPRESSION: 1. Masslike appearance of the right infrahilar region. The possibility of underlying pulmonary mass and/or right sided lymphadenopathy is not excluded, and correlation with contrast enhanced chest CT is suggested at this time. Given the patient's history of hypoxia, PE protocol CT scan would be optimal to evaluate the anatomy associated with this finding, as well as to exclude underlying pulmonary embolism if clinically appropriate.  2. Atherosclerosis. These results were called by telephone at the time of interpretation on 12/31/2013 at 6:02 PM to Dr. Davonna Belling, who verbally acknowledged these results.   Electronically Signed   By: Vinnie Langton M.D.   On: 12/31/2013 18:04   CT Chest: massive saddle pulmonary embolism  with lytic lesion on manubrium Venous doppler RLE DVT popliteal, distal femoral  ASSESSMENT / PLAN:  Principal Problem:   Acute massive pulmonary embolism Active Problems:   DM type 2 (diabetes mellitus, type 2)   Shock circulatory   Acute respiratory failure   Lumbar disc disease RLE DVT  PULMONARY A: Massive Saddle Pulmonary embolus with probable right heart strain also RLE DVT  - provoked in setting of right DVT with recent immobilization and possible malignancy  Pulmonary Nodule    - 6 mm left lower lobe. Pt is non-smoker. P:  Oxygen supplementation to keep SpO2 > 92%  Start IV heparin Low threshold for TPA if hypotension continues and evidence of significant right heart strain  , reluctant to give immediately d/t concern bleeding with probable metastatic cancer Obtain 2D-echo to assess for right heart strain Will eventually need further work-up of nodule   CARDIOVASCULAR A: Hypotension in setting of massive PE  - improved after IVF bolus Probable right heart strain in setting of massive PE    - i-stat trop mildly elevated 0.21, 12-lead EKG with no RBBB Hypertension Hyperlipidemia  Heart murmur P:  Cycle troponins Obtain 2D-Echo Repeat 12-lead EKG Hold home tribenzor   RENAL A:  Hyperkalemia   -in setting of AKI, s/p bicarbonate in ED Mild hyponatremia  Acute Kidney Injury    - Cr 2.8 on admission, baseline 1.3 most likely pre-renal azotemia in setting of hypotension from PE. UA unremarkable. Anion gap metabolic acidosis   - secondary to most likely lactic acidosis and AKI  P:   Monitor BMP NS 100 m//hr  (s/p 2 L bolus in ED) Consider kayexalate if no improvement in  K Foley in place Consider renal US if no improvement of renal function  GASTROINTESTINAL A: Chronic Diarrhea  - in setting of metformin use  History of cholecystectomy History of diverticulosis  History of hiatal hernia  Morbid Obesity  - BMI 46 GERD P:   NPO  Famotidine 20 mg daily   HEMATOLOGIC/ONCOLOGICAL A:  Provoked acute right nonocclusive DVT   -posterior tibial, popliteal, distal femoral vein. Provoked in setting of recent immolization and possible malignancy Osteolytic lesion of manubrium  - concerning for metastasis vs MM Pulmonary and adrenal masses Normocytic Anemia   -Hg stable, no active bleeding P:  Start IV heparin  Monitor CBC Obtain protein electrophoresis and SPEP/UPEP to assess for MM (back pain, anemia, renal failure) Follow-up for work-up of osteolytic lesion and pulmonary/adrenal masses  INFECTIOUS A:  Leukocytosis with neutrophilia   - likely secondary to PE, PCT level 0.17 reassuring  P:   D/C empiric vancomycin and zosyn  F/U blood cultures Follow lactic acid  Monitor CBC  ENDOCRINE A:  3.8 cm right adrenal mass -etiology possible due to metastasis vs adenoma (functioning vs nonfunctioning?) Non-insulin dependent Type II DM - last A1c 5.9 on 07/19/13  Hypothyroidism on replacement therapy  -Last TSH 0.641 on 10/30/13 Possible right kidney stones   - per lumbar spine imaging 10/03/13 P:   Hold metformin and saxagliptin  SSI, monitor CBG IV 50 mcg synthroid daily  F/u of adrenal mass  MUSCULOSKELETAL A: Degenerative facet disease in the lower lumbar spine with anterolisthesis at L4-L5 Osteoarthritis  Nontophaceous Gout   - no recent flare History of b/l knee replacement  P:  Surgery scheduled for 4/20 is postponed Hold home norco/vicodin in setting of hypotension Restart allopurinol once PO Hold home zanaflex    NEUROLOGIC A: Peripheral Neuropathy  - likely due  to diabetes P:   Hold home gabapentin 300 mg TID  Juluis Mire, MD PGY-1 IMTS   I have seen and examined this pt with the PGY-1 resident. Edits to note above were made.  Pt with PE/DVT, mild shock, acute renal failure, volume depletion.  Plan is heparin drip, low threshold for TPA.   Surgery on back was cancelled  I met with the pts entire family and updated them in full with the resident.  CC 59min  Phillip Heal Beeper  [phone]  Cell  [phone]  If no response or cell goes to voicemail, call beeper [phone]  12/31/2013, 6:54 PM

## 2013-12-31 NOTE — Progress Notes (Signed)
CRITICAL VALUE ALERT  Critical value received:  Trop 0.39  Date of notification:  12/31/2013  Time of notification:  2339  Critical value read back:yes  Nurse who received alert:  Glenford Peers RN  MD notified (1st page):  CCM Time of first page:  2341  MD notified (2nd page):  Time of second page:  Responding MD:  Dr Tamala Julian  Time MD responded:  2341

## 2013-12-31 NOTE — ED Notes (Addendum)
Per family pt has been lethargic today. Pt is suppose to have back surgery. Pt presents with hypotension 75/54, pulse ox in 80's on room air and lethargic. Pt has not urinated today.

## 2013-12-31 NOTE — ED Notes (Signed)
Dr Alvino Chapel given lactic acid results 4.16

## 2013-12-31 NOTE — ED Notes (Signed)
This nurse transported pt to CT and transported pt back to room.

## 2013-12-31 NOTE — Progress Notes (Signed)
VASCULAR LAB PRELIMINARY  PRELIMINARY  PRELIMINARY  PRELIMINARY  Right lower extremity venous Doppler completed.    Preliminary report:  There is acute DVT noted in the posterior tibial, popliteal, and distal femoral vein of the right lower extremity.  Iantha Fallen, RVT 12/31/2013, 7:16 PM

## 2013-12-31 NOTE — ED Notes (Signed)
Pt unable to maintain sats greater than 90% on 5L [place], pt placed on NRB

## 2013-12-31 NOTE — Progress Notes (Signed)
  ANTICOAGULATION CONSULT NOTE - Initial Consult  Pharmacy Consult for Heparin Indication: DVT/PE  Allergies  Allergen Reactions  . Ace Inhibitors Cough  . Byetta 10 Mcg Pen [Exenatide] Other (See Comments)    Pancreatitis 02/2005    Patient Measurements: Height: 5' (152.4 cm) Weight: 241 lb (109.317 kg) IBW/kg (Calculated) : 45.5 Heparin Dosing Weight:  72.5 kg  Vital Signs: Temp: 100.2 F (37.9 C) (04/19 1708) Temp src: Rectal (04/19 1708) BP: 117/89 mmHg (04/19 1915) Pulse Rate: 61 (04/19 1915)  Labs:  Recent Labs  12/31/13 1705 12/31/13 1729  HGB 11.4* 13.3  HCT 36.5 39.0  PLT 206  --   CREATININE 2.80* 3.00*    Estimated Creatinine Clearance: 16.5 ml/min (by C-G formula based on Cr of 3).   Medical History: Past Medical History  Diagnosis Date  . DM type 2 (diabetes mellitus, type 2)   . Hyperlipemia   . Hypertension   . Osteopenia     Lumbar spine  . Hx of osteoarthritis     bilateral knee osteoarthritis  -   bilateral knee replacement  . Colon polyps     Tubular Adenoma  . Diverticulosis     patient unaware   . Family history of anesthesia complication     Daughter, Son and Yolanda Bonine - slow to awaken  . Peripheral neuropathy   . Hiatal hernia   . Heart murmur     no need to follow  . GERD (gastroesophageal reflux disease)     not on meds, tums prn  . Arthritis   . Hypothyroidism     Medications:   Assessment: 80 y/o F presents with hypotension and fatigue, hypoxia. Dopplers +DVT. CT +PE.  4/19 CT: Massive saddle embolus, other masses and nodules not excluding malignancy. pH 2.7, LA 4.16, Troponin 0.21.  Code sepsis initiated and pharmacy to start heparin for DVT/PE. Scr 3.   Goal of Therapy:  Heparin level 0.3-0.7 units/ml Monitor platelets by anticoagulation protocol: Yes   Plan:  Heparin 4000 unit IV bolus. Heparin 1200 units/hr.  Check heparin level and CBC daily.   Amber Knox, PharmD, [facility] Clinical Staff  Pharmacist Pager [phone]  Amber Knox 12/31/2013,7:32 PM

## 2013-12-31 NOTE — ED Notes (Signed)
Dr Alvino Chapel given a copy of troponin results .21

## 2013-12-31 NOTE — ED Provider Notes (Signed)
CSN: [id number]     Arrival date & time 12/31/13  1615 History   First MD Initiated Contact with Patient 12/31/13 1627     Chief Complaint  Patient presents with  . Fatigue  . Code Sepsis     (Consider location/radiation/quality/duration/timing/severity/associated sxs/prior Treatment) The history is provided by the patient and a relative.   patient presents with altered mental status. Found to be hypoxic with sats in the 80s and hypotensive with pressures in the 80s. She is mostly bedbound with chronic back pain and was scheduled to have a microdiscectomy tomorrow. She's been more confused also. She began to feel bad yesterday. No chest pain. No cough. No dysuria, but patient states she has not urinated since yesterday. She's not currently on antibiotics.  Past Medical History  Diagnosis Date  . DM type 2 (diabetes mellitus, type 2)   . Hyperlipemia   . Hypertension   . Osteopenia     Lumbar spine  . Hx of osteoarthritis     bilateral knee osteoarthritis  -   bilateral knee replacement  . Colon polyps     Tubular Adenoma  . Diverticulosis     patient unaware   . Family history of anesthesia complication     Daughter, Son and Lucila Maine - slow to awaken  . Peripheral neuropathy   . Hiatal hernia   . Heart murmur     no need to follow  . GERD (gastroesophageal reflux disease)     not on meds, tums prn  . Arthritis   . Hypothyroidism    Past Surgical History  Procedure Laterality Date  . Abdominal hysterectomy      excessive bleeding (IUD)  . Cataract extraction Right   . Cholecystectomy    . Joint replacement    . Cardiac catheterization  1995    [facility] - clean  . Eye surgery Right     Cataract  . Knee arthroplasty Bilateral   . Tonsillectomy    . Carpal tunnel release Right    Family History  Problem Relation Age of Onset  . Vascular Disease Father     Cardio  . Diabetes type II Other   . Diabetes type II Other   . Breast cancer Paternal Aunt    History   Substance Use Topics  . Smoking status: Never Smoker   . Smokeless tobacco: Never Used  . Alcohol Use: No   OB History   Grav Para Term Preterm Abortions TAB SAB Ect Mult Living                 Review of Systems  Constitutional: Negative for appetite change and fatigue.  Respiratory: Positive for shortness of breath.   Cardiovascular: Negative for chest pain.  Gastrointestinal: Negative for abdominal pain.  Genitourinary: Positive for flank pain.  Musculoskeletal: Positive for back pain.  Skin: Negative for wound.  Neurological: Positive for weakness. Negative for numbness.      Allergies  Ace inhibitors and Byetta 10 mcg pen  Home Medications   Prior to Admission medications   Medication Sig Start Date End Date Taking? Authorizing Provider  allopurinol (ZYLOPRIM) 300 MG tablet Take 1 tablet (300 mg total) by mouth daily. 07/19/13 07/19/14 Yes Gillian Scarce, MD  aspirin 81 MG tablet Take 81 mg by mouth daily.   Yes Historical Provider, MD  celecoxib (CELEBREX) 200 MG capsule Take 1 capsule (200 mg total) by mouth daily. 10/23/13 10/23/14 Yes Gillian Scarce, MD  colestipol (COLESTID) 1  G tablet Take 2 tablets (2 g total) by mouth 2 (two) times daily. 07/19/13 07/19/14 Yes Jonathon Resides, MD  gabapentin (NEURONTIN) 300 MG capsule Take 1 capsule (300 mg total) by mouth 3 (three) times daily. 10/30/13  Yes Jonathon Resides, MD  HYDROcodone-acetaminophen (NORCO/VICODIN) 5-325 MG per tablet Take 1 tablet by mouth every 6 (six) hours as needed for moderate pain.   Yes Historical Provider, MD  levothyroxine (SYNTHROID, LEVOTHROID) 112 MCG tablet Take 1 tablet (112 mcg total) by mouth daily. 11/02/13 11/02/14 Yes Jonathon Resides, MD  lidocaine (LIDODERM) 5 % Place 1-3 patches onto the skin daily. 10/23/13 10/23/14 Yes Jonathon Resides, MD  metFORMIN (GLUCOPHAGE) 1000 MG tablet Take 1 tablet (1,000 mg total) by mouth 2 (two) times daily with a meal. 07/28/13 07/28/14 Yes Jonathon Resides, MD   Olmesartan-Amlodipine-HCTZ (TRIBENZOR) 40-10-25 MG TABS Take 1 tablet by mouth daily. 07/19/13 07/19/14 Yes Jonathon Resides, MD  ondansetron (ZOFRAN-ODT) 8 MG disintegrating tablet Take 8 mg by mouth every 8 (eight) hours as needed for nausea.  11/16/13 11/17/14 Yes Jonathon Resides, MD  promethazine (PHENERGAN) 12.5 MG tablet Take 12.5 mg by mouth every 6 (six) hours as needed for nausea.  10/23/13 10/23/14 Yes Jonathon Resides, MD  saxagliptin HCl (ONGLYZA) 5 MG TABS tablet Take 1 tablet (5 mg total) by mouth daily. 07/19/13 07/19/14 Yes Jonathon Resides, MD  tiZANidine (ZANAFLEX) 4 MG tablet Take 4 mg by mouth every 6 (six) hours as needed for muscle spasms.   Yes Historical Provider, MD  traMADol (ULTRAM) 50 MG tablet Take 50 mg by mouth every 6 (six) hours as needed for moderate pain.   Yes Historical Provider, MD   BP 117/89  Pulse 61  Temp(Src) 100.2 F (37.9 C) (Rectal)  Resp 29  Ht 5' (1.524 m)  Wt 241 lb (109.317 kg)  BMI 47.07 kg/m2  SpO2 98% Physical Exam  Constitutional: She is oriented to person, place, and time. She appears well-developed.  Patient is obese  HENT:  Head: Normocephalic.  Eyes: Pupils are equal, round, and reactive to light.  Cardiovascular:  Regular rhythm. Mild bradycardia.  Pulmonary/Chest:  Somewhat shallow breaths. Difficulty with auscultation do to body habitus.  Abdominal:  Mild right abdominal tenderness. No rebound or guarding. No hernias. Large pannus  Musculoskeletal: She exhibits edema.  Bilateral lower extremity pitting edema  Neurological: She is alert and oriented to person, place, and time.  Patient is awake and appropriate, but somewhat slow to answer. This is not patient's baseline per family member.  Skin: No erythema.    ED Course  Procedures (including critical care time) Labs Review Labs Reviewed  CBC WITH DIFFERENTIAL - Abnormal; Notable for the following:    WBC 17.1 (*)    Hemoglobin 11.4 (*)    Neutrophils Relative % 82 (*)    Neutro  Abs 13.9 (*)    All other components within normal limits  COMPREHENSIVE METABOLIC PANEL - Abnormal; Notable for the following:    Sodium 133 (*)    Potassium 6.1 (*)    CO2 17 (*)    Glucose, Bld 168 (*)    BUN 43 (*)    Creatinine, Ser 2.80 (*)    Albumin 3.4 (*)    GFR calc non Af Amer 15 (*)    GFR calc Af Amer 17 (*)    All other components within normal limits  URINALYSIS, ROUTINE W REFLEX MICROSCOPIC - Abnormal; Notable for the following:  Color, Urine AMBER (*)    APPearance CLOUDY (*)    Bilirubin Urine SMALL (*)    Ketones, ur 15 (*)    All other components within normal limits  I-STAT CG4 LACTIC ACID, ED - Abnormal; Notable for the following:    Lactic Acid, Venous 4.16 (*)    All other components within normal limits  I-STAT CHEM 8, ED - Abnormal; Notable for the following:    Sodium 130 (*)    Potassium 5.8 (*)    BUN 41 (*)    Creatinine, Ser 3.00 (*)    Glucose, Bld 168 (*)    Calcium, Ion 1.09 (*)    All other components within normal limits  I-STAT TROPOININ, ED - Abnormal; Notable for the following:    Troponin i, poc 0.21 (*)    All other components within normal limits  I-STAT ARTERIAL BLOOD GAS, ED - Abnormal; Notable for the following:    pH, Arterial 7.238 (*)    pCO2 arterial 47.5 (*)    pO2, Arterial 172.0 (*)    Acid-base deficit 7.0 (*)    All other components within normal limits  CULTURE, BLOOD (ROUTINE X 2)  CULTURE, BLOOD (ROUTINE X 2)  URINE CULTURE  PROCALCITONIN  PROCALCITONIN    Imaging Review Ct Chest Wo Contrast  12/31/2013   CLINICAL DATA:  Hypotension. Possible sepsis. Lethargy. Possible right infrahilar mass noted on recent chest x-ray.  EXAM: CT CHEST WITHOUT CONTRAST  TECHNIQUE: Multidetector CT imaging of the chest was performed following the standard protocol without IV contrast.  COMPARISON:  No priors.  Chest x-ray 12/31/2013.  FINDINGS: Mediastinum: Although today's study is a noncontrast CT examination, when the viewing  windows are narrowed, there is clear evidence of a massive saddle embolus extending into lobar and segmental sized pulmonary artery branches to the lungs bilaterally. The apparent hilar enlargement noted on the recent chest radiograph was presumably secondary to this finding (so called "Fleischner sign"). Dilatation of the pulmonic trunk (3.6 cm in diameter, and probable dilatation of the right heart (difficult to assess on today's non contrast CT examination), indicative of pulmonary arterial hypertension and likely elevated right-sided heart pressures. Heart size is borderline enlarged. There is no significant pericardial fluid, thickening or pericardial calcification. There is atherosclerosis of the thoracic aorta, the great vessels of the mediastinum and the coronary arteries, including calcified atherosclerotic plaque in the left main, left anterior descending, left circumflex and right coronary arteries. No pathologically enlarged mediastinal or hilar lymph nodes. Please note that accurate exclusion of hilar adenopathy is limited on noncontrast CT scans.  Lungs/Pleura: No right middle or lower lobe mass identified. No acute consolidative airspace disease to suggest hemorrhage from complete pulmonary infarction at this time. 6 mm nodule in the superior segment of the left lower lobe (image 20 of series 3). Linear areas of mild scarring throughout the lung bases bilaterally.  Upper Abdomen: Status post cholecystectomy. 3.8 cm right adrenal mass is indeterminate (14 HU). Multiple low-attenuation lesions in the upper pole of the left kidney, largest of which measures 7.7 cm in diameter; these are incompletely characterized on today's non contrast CT examination, but are favored to represent cysts.  Musculoskeletal: Lucent lesion in the inferior aspect of the manubrium with associated nondisplaced pathologic fracture best appreciated on sagittal reconstructions.  IMPRESSION: 1. Massive saddle embolus extending into  the lobar and segmental sized branches of both lungs. There is dilatation of the pulmonic trunk, and probable dilatation of the right heart, indicative  of elevated pulmonary artery pressures and likely developing right-sided heart strain. 2. Aggressive appearing lytic lesion with pathologic fracture through the was inferior aspect of the manubrium. Statistically, this is likely to represent a metastatic lesion, and further evaluation to identified potential source of primary malignancy is suggested. 3. 3.8 cm right adrenal mass is indeterminate. While this may simply represent a lipid poor adenoma, but given the appearance of the manubrium, the possibility of a metastatic lesion or primary malignancy is not excluded. 4. 6 mm nodule in the superior segment of the left lower lobe. Attention on followup studies is recommended, as this could represent a metastatic lesion or a primary neoplasm, although this is highly nonspecific at this time. Critical Value/emergent results were called by telephone at the time of interpretation on 12/31/2013 at 6:56 PM to Dr. Davonna Belling , who verbally acknowledged these results.   Electronically Signed   By: Vinnie Langton M.D.   On: 12/31/2013 19:08   Dg Chest Port 1 View  12/31/2013   CLINICAL DATA:  Hypoxia.  Fatigue.  EXAM: PORTABLE CHEST - 1 VIEW  COMPARISON:  Chest x-ray 12/28/2013.  FINDINGS: There is a masslike appearance of the right infrahilar region. Lungs otherwise appear clear. No pleural effusions. No evidence of pulmonary edema. Mild cardiomegaly discretion borderline cardiomegaly, likely accentuated by portable AP technique. Atherosclerosis in the thoracic aorta.  IMPRESSION: 1. Masslike appearance of the right infrahilar region. The possibility of underlying pulmonary mass and/or right sided lymphadenopathy is not excluded, and correlation with contrast enhanced chest CT is suggested at this time. Given the patient's history of hypoxia, PE protocol CT scan  would be optimal to evaluate the anatomy associated with this finding, as well as to exclude underlying pulmonary embolism if clinically appropriate. 2. Atherosclerosis. These results were called by telephone at the time of interpretation on 12/31/2013 at 6:02 PM to Dr. Davonna Belling, who verbally acknowledged these results.   Electronically Signed   By: Vinnie Langton M.D.   On: 12/31/2013 18:04     EKG Interpretation None      Date: 12/31/2013  Rate: 59  Rhythm: junctional bradycardia  QRS Axis: normal  Intervals: normal  ST/T Wave abnormalities: normal  Conduction Disutrbances:none  Narrative Interpretation: No P waves visible. Difficult to see P waves on previous EKG due to artifact.  Old EKG Reviewed: changes noted   MDM   Final diagnoses:  Renal failure  Hyperkalemia  Pulmonary embolism    Patient presents hypotensive and hypoxic. Rectal temperature was 100.2. Patient was given fluid boluses and after elevated lactate returned was called a code sepsis. Zosyn and vancomycin were given. Chest x-ray returned and showed no infiltrate, but some right. Hilar fullness. CT of the chest with contrast is recommended, however due to urgent renal failure it was done without contrast. Patient was given further fluid boluses since her pressure remained low. She was found to be a new renal failure with a creatinine of 3 and an elevated potassium of 6.1. Her bicarbonate was 17. Patient was given an amp of bicarbonate do to hypotension and lack of P waves with bradycardia on EKG. Critical care had also been consulted by this time. CT scan of the chest showed large saddle pulmonary embolus. Also showed lytic sternal lesion. Patient was started on heparin by critical care. Admitted to the ICU  CRITICAL CARE Performed by: Jasper Riling. Chloe Flis Total critical care time: 30 Critical care time was exclusive of separately billable procedures and treating other patients.  Critical care was necessary to  treat or prevent imminent or life-threatening deterioration. Critical care was time spent personally by me on the following activities: development of treatment plan with patient and/or surrogate as well as nursing, discussions with consultants, evaluation of patient's response to treatment, examination of patient, obtaining history from patient or surrogate, ordering and performing treatments and interventions, ordering and review of laboratory studies, ordering and review of radiographic studies, pulse oximetry and re-evaluation of patient's condition.    Jasper Riling. Alvino Chapel, MD 12/31/13 [phone]

## 2014-01-01 ENCOUNTER — Encounter (HOSPITAL_COMMUNITY): Admission: RE | Payer: Self-pay | Source: Ambulatory Visit

## 2014-01-01 ENCOUNTER — Ambulatory Visit (HOSPITAL_COMMUNITY): Admission: RE | Admit: 2014-01-01 | Payer: Medicare Other | Source: Ambulatory Visit | Admitting: Neurosurgery

## 2014-01-01 ENCOUNTER — Inpatient Hospital Stay (HOSPITAL_COMMUNITY): Payer: Medicare Other

## 2014-01-01 DIAGNOSIS — D649 Anemia, unspecified: Secondary | ICD-10-CM

## 2014-01-01 DIAGNOSIS — C7970 Secondary malignant neoplasm of unspecified adrenal gland: Secondary | ICD-10-CM

## 2014-01-01 DIAGNOSIS — C801 Malignant (primary) neoplasm, unspecified: Secondary | ICD-10-CM

## 2014-01-01 LAB — POCT I-STAT 3, ART BLOOD GAS (G3+)
ACID-BASE DEFICIT: 6 mmol/L — AB (ref 0.0–2.0)
Bicarbonate: 21.7 mEq/L (ref 20.0–24.0)
O2 SAT: 95 %
PCO2 ART: 48.9 mmHg — AB (ref 35.0–45.0)
Patient temperature: 97.8
TCO2: 23 mmol/L (ref 0–100)
pH, Arterial: 7.252 — ABNORMAL LOW (ref 7.350–7.450)
pO2, Arterial: 84 mmHg (ref 80.0–100.0)

## 2014-01-01 LAB — BASIC METABOLIC PANEL
BUN: 38 mg/dL — AB (ref 6–23)
BUN: 34 mg/dL — AB (ref 6–23)
CO2: 17 meq/L — AB (ref 19–32)
CO2: 21 mEq/L (ref 19–32)
CREATININE: 2.01 mg/dL — AB (ref 0.50–1.10)
Calcium: 7.7 mg/dL — ABNORMAL LOW (ref 8.4–10.5)
Calcium: 8.4 mg/dL (ref 8.4–10.5)
Chloride: 103 mEq/L (ref 96–112)
Chloride: 106 mEq/L (ref 96–112)
Creatinine, Ser: 2.2 mg/dL — ABNORMAL HIGH (ref 0.50–1.10)
GFR calc Af Amer: 23 mL/min — ABNORMAL LOW (ref >90)
GFR calc Af Amer: 26 mL/min — ABNORMAL LOW (ref >90)
GFR, EST NON AFRICAN AMERICAN: 20 mL/min — AB (ref >90)
GFR, EST NON AFRICAN AMERICAN: 22 mL/min — AB (ref >90)
GLUCOSE: 93 mg/dL (ref 70–99)
Glucose, Bld: 93 mg/dL (ref 70–99)
POTASSIUM: 5.5 meq/L — AB (ref 3.7–5.3)
POTASSIUM: 5.5 meq/L — AB (ref 3.7–5.3)
Sodium: 134 mEq/L — ABNORMAL LOW (ref 137–147)
Sodium: 139 mEq/L (ref 137–147)

## 2014-01-01 LAB — PROCALCITONIN: PROCALCITONIN: 0.14 ng/mL

## 2014-01-01 LAB — CBC
HCT: 29.9 % — ABNORMAL LOW (ref 36.0–46.0)
Hemoglobin: 9.4 g/dL — ABNORMAL LOW (ref 12.0–15.0)
MCH: 29.2 pg (ref 26.0–34.0)
MCHC: 31.4 g/dL (ref 30.0–36.0)
MCV: 92.9 fL (ref 78.0–100.0)
Platelets: 174 10*3/uL (ref 150–400)
RBC: 3.22 MIL/uL — ABNORMAL LOW (ref 3.87–5.11)
RDW: 14.9 % (ref 11.5–15.5)
WBC: 14.1 10*3/uL — AB (ref 4.0–10.5)

## 2014-01-01 LAB — TROPONIN I
TROPONIN I: 0.41 ng/mL — AB (ref <0.30)
Troponin I: 0.6 ng/mL (ref <0.30)

## 2014-01-01 LAB — GLUCOSE, CAPILLARY
GLUCOSE-CAPILLARY: 125 mg/dL — AB (ref 70–99)
Glucose-Capillary: 101 mg/dL — ABNORMAL HIGH (ref 70–99)
Glucose-Capillary: 119 mg/dL — ABNORMAL HIGH (ref 70–99)
Glucose-Capillary: 135 mg/dL — ABNORMAL HIGH (ref 70–99)
Glucose-Capillary: 93 mg/dL (ref 70–99)
Glucose-Capillary: 98 mg/dL (ref 70–99)

## 2014-01-01 LAB — HEPARIN LEVEL (UNFRACTIONATED)
HEPARIN UNFRACTIONATED: 0.33 [IU]/mL (ref 0.30–0.70)
Heparin Unfractionated: 0.2 IU/mL — ABNORMAL LOW (ref 0.30–0.70)
Heparin Unfractionated: 0.35 IU/mL (ref 0.30–0.70)

## 2014-01-01 LAB — LACTIC ACID, PLASMA: Lactic Acid, Venous: 1.2 mmol/L (ref 0.5–2.2)

## 2014-01-01 SURGERY — LUMBAR LAMINECTOMY/DECOMPRESSION MICRODISCECTOMY 2 LEVELS
Anesthesia: General | Site: Back | Laterality: Right

## 2014-01-01 MED ORDER — FAMOTIDINE IN NACL 20-0.9 MG/50ML-% IV SOLN
20.0000 mg | INTRAVENOUS | Status: DC
  Administered 2014-01-02 – 2014-01-03 (×3): 20 mg via INTRAVENOUS
  Filled 2014-01-01 (×4): qty 50

## 2014-01-01 MED ORDER — HEPARIN (PORCINE) IN NACL 100-0.45 UNIT/ML-% IJ SOLN
1700.0000 [IU]/h | INTRAMUSCULAR | Status: DC
  Administered 2014-01-02 – 2014-01-04 (×4): 1700 [IU]/h via INTRAVENOUS
  Filled 2014-01-01 (×3): qty 250

## 2014-01-01 MED ORDER — SODIUM CHLORIDE 0.9 % IV BOLUS (SEPSIS)
500.0000 mL | Freq: Once | INTRAVENOUS | Status: AC
  Administered 2014-01-01: 500 mL via INTRAVENOUS

## 2014-01-01 NOTE — Progress Notes (Signed)
ANTICOAGULATION CONSULT NOTE - Follow Up Consult  Pharmacy Consult for Heparin  Indication: pulmonary embolus and DVT  Allergies  Allergen Reactions  . Ace Inhibitors Cough  . Byetta 10 Mcg Pen [Exenatide] Other (See Comments)    Pancreatitis 02/2005   Patient Measurements: Height: 5' (152.4 cm) Weight: 249 lb 9 oz (113.2 kg) IBW/kg (Calculated) : 45.5 Heparin Dosing Weight: 72.5 kg  Labs:  Recent Labs  12/31/13 1705 12/31/13 1729 12/31/13 2254 01/01/14 0210 01/01/14 0220 01/01/14 1045 01/01/14 1056  HGB 11.4* 13.3  --   --  9.4*  --   --   HCT 36.5 39.0  --   --  29.9*  --   --   PLT 206  --   --   --  174  --   --   HEPARINUNFRC  --   --   --   --  0.33  --  0.20*  CREATININE 2.80* 3.00* 2.28*  --  2.20* 2.01*  --   TROPONINI  --   --  0.39* 0.60*  --  0.41*  --    Estimated Creatinine Clearance: 25.2 ml/min (by C-G formula based on Cr of 2.01).  Medications:  Heparin 1200 units/hr  Assessment: 80 y/o F on heparin for DVT/PE. Heparin level is subtherapeutic at 0.20 units/ml.    Goal of Therapy:  Heparin level 0.3-0.7 units/ml Monitor platelets by anticoagulation protocol: Yes   Plan:  -Increase  heparin drip to 1400 units/hr -Check 6 hr HL -Daily CBC/HL -Monitor for bleeding, trend Hgb  Evalette Montrose Poteet Gor Vestal 01/01/2014,1:07 PM

## 2014-01-01 NOTE — Progress Notes (Signed)
Called Troponin 0.6 to Dr Tamala Julian and reported that patient is more lethargic, arousals and oriented but falls back to sleep very quickly. Also reported BP 89/37. Received order for stat ABG and lactic acid.

## 2014-01-01 NOTE — H&P (Signed)
PULMONARY / CRITICAL CARE MEDICINE   Name: Amber Knox MRN: [id number] DOB: 01-16-1930    ADMISSION DATE:  12/31/2013 CONSULTATION DATE:  12/31/2013  REFERRING MD : EDP PRIMARY SERVICE: PCCM  CHIEF COMPLAINT:  Hypotension   BRIEF PATIENT DESCRIPTION:    Amber Knox is a 83-yo-female with pmh T2DM, HTN, HL, hypothyroidism, OA, osteopenia,  lumbar back pain scheduled for right L2-3, right L4-5 microdiscectomy on 4/20 who presented with 1 day history of lightheadedness, weakness, and decreased urine output. Per family pt was feeling her normal self until this morning when she began feeling "drunk" and lightheaded with weakness and fatigue. Pt has been having 3 month history of low back pain and was in rehab facility from March 13-April 9 with immobilization and inability to ambulate with right leg leg pain since being home.  Pt was scheduled for right L2-3, right L4-5 microdiscectomy  tomorrow and underwent pre-op evaluation on 4/16 that was normal. She reports no shortness of breath, cough, URI symptoms, fevers, or sick contacts. She also denies chest pain, tachycardia, hemoptysis, or syncope. She has been having decreased appetite for some time without reported weight change.  She reports no history of blood clots.   In the ED pt was found to have blood pressure of 75/54 with pO2 of 80% on RA with CT chest w/o contrast findings  of massive saddle pulmonary embolus. Pt was given IVF's and supplemental oxygen (4L via NRB) in the ED with improvement of blood pressure and hypoxia.     LINES / TUBES: PIV 4/9 >> Foley Catheter 4/19  CULTURES: Blood cultures 4/19 >>   ANTIBIOTICS: Vancomycin 4/19>>4/19 Zosyn 4/19 >>4/19    ROS   SIGNIFICANT EVENTS / STUDIES:  4/19 Admitted with massive saddle pulmonary embolus, started on IV heparin 4/19 CT chest w/o cont  with massive saddle embolus extending into the lobar and segmental sized branches of both lungs with likely developing right  heart strain. Osteolytic lesion of manubrium, 3.8 cm right adrenal mass, 6 mm nodule in left lower pulmonary lobe    SUBJECTIVE/OVERNIGHT/INTERVAL HX 01/01/14: Prescheduled back surgery for today will not happen. Mostly BP okay, HR 74, Pulsue ox 94% on 50% face mask, not confused. Got emotional on hearing news of likely advanced cancer  VITAL SIGNS: Temp:  [97.8 F (36.6 C)-100.2 F (37.9 C)] 98.8 F (37.1 C) (04/20 0743) Pulse Rate:  [53-65] 65 (04/20 0700) Resp:  [11-29] 16 (04/20 0700) BP: (75-125)/(32-92) 112/44 mmHg (04/20 0700) SpO2:  [90 %-100 %] 96 % (04/20 0700) FiO2 (%):  [50 %] 50 % (04/20 0700) Weight:  [241 lb (109.317 kg)-249 lb 9 oz (113.2 kg)] 249 lb 9 oz (113.2 kg) (04/20 0500) HEMODYNAMICS: CV stable at present   VENTILATOR SETTINGS: 50% VM sats 95% Vent Mode:  [-]  FiO2 (%):  [50 %] 50 % INTAKE / OUTPUT: Intake/Output     04/19 0701 - 04/20 0700 04/20 0701 - 04/21 0700   I.V. (mL/kg) 1232 (10.9)    IV Piggyback 250    Total Intake(mL/kg) 1482 (13.1)    Urine (mL/kg/hr) 460    Total Output 460     Net +1022            PHYSICAL EXAMINATION: General:  Obese woman lying in bed on NRB Neuro:  A & O x 3, no focal deficits  HEENT:  Normocephalic, nontraumatic, dry mucous membranes Cardiovascular: distant heart sounds, normal rate and rhythm, systolic murmur Lungs:  Clear to ausculation bilaterally.  No wheezing, ronchi, or rales Abdomen:  Soft, non-distended, tenderness to palpation of right middle quadrant, normal BS, no peritoneal signs  Musculoskeletal:  Moving all extremities, b/l LE +1 edema, tenderness to palpation of right LE up to thigh, Homan's sign positive  Skin:  Warm, dry  LABS: PULMONARY  Recent Labs Lab 12/31/13 1729 12/31/13 1908 01/01/14 0509  PHART  --  7.238* 7.252*  PCO2ART  --  47.5* 48.9*  PO2ART  --  172.0* 84.0  HCO3  --  20.3 21.7  TCO2 21 22 23   O2SAT  --  99.0 95.0    CBC  Recent Labs Lab 12/28/13 0942  12/31/13 1705 12/31/13 1729 01/01/14 0220  HGB 11.7* 11.4* 13.3 9.4*  HCT 36.6 36.5 39.0 29.9*  WBC 17.0* 17.1*  --  14.1*  PLT 234 206  --  174    COAGULATION No results found for this basename: INR,  in the last 168 hours  CARDIAC   Recent Labs Lab 12/31/13 2254 01/01/14 0210  TROPONINI 0.39* 0.60*   No results found for this basename: PROBNP,  in the last 168 hours   CHEMISTRY  Recent Labs Lab 12/28/13 0942 12/31/13 1705 12/31/13 1729 12/31/13 2254 01/01/14 0220  NA 137 133* 130* 134* 134*  K 4.8 6.1* 5.8* 5.5* 5.5*  CL 96 97 99 102 103  CO2 24 17*  --  18* 17*  GLUCOSE 121* 168* 168* 108* 93  BUN 29* 43* 41* 38* 38*  CREATININE 1.48* 2.80* 3.00* 2.28* 2.20*  CALCIUM 9.2 9.0  --  7.7* 7.7*   Estimated Creatinine Clearance: 23 ml/min (by C-G formula based on Cr of 2.2).   LIVER  Recent Labs Lab 12/31/13 1705  AST 30  ALT 30  ALKPHOS 70  BILITOT 0.3  PROT 6.7  ALBUMIN 3.4*     INFECTIOUS  Recent Labs Lab 12/31/13 1705 12/31/13 1730 01/01/14 0220 01/01/14 0632  LATICACIDVEN  --  4.16*  --  1.2  PROCALCITON 0.17  --  0.14  --      ENDOCRINE CBG (last 3)   Recent Labs  12/31/13 2213 01/01/14 0022 01/01/14 0741  GLUCAP 104* 101* 98         IMAGING x48h  Ct Chest Wo Contrast  12/31/2013   CLINICAL DATA:  Hypotension. Possible sepsis. Lethargy. Possible right infrahilar mass noted on recent chest x-ray.  EXAM: CT CHEST WITHOUT CONTRAST  TECHNIQUE: Multidetector CT imaging of the chest was performed following the standard protocol without IV contrast.  COMPARISON:  No priors.  Chest x-ray 12/31/2013.  FINDINGS: Mediastinum: Although today's study is a noncontrast CT examination, when the viewing windows are narrowed, there is clear evidence of a massive saddle embolus extending into lobar and segmental sized pulmonary artery branches to the lungs bilaterally. The apparent hilar enlargement noted on the recent chest radiograph was  presumably secondary to this finding (so called "Fleischner sign"). Dilatation of the pulmonic trunk (3.6 cm in diameter, and probable dilatation of the right heart (difficult to assess on today's non contrast CT examination), indicative of pulmonary arterial hypertension and likely elevated right-sided heart pressures. Heart size is borderline enlarged. There is no significant pericardial fluid, thickening or pericardial calcification. There is atherosclerosis of the thoracic aorta, the great vessels of the mediastinum and the coronary arteries, including calcified atherosclerotic plaque in the left main, left anterior descending, left circumflex and right coronary arteries. No pathologically enlarged mediastinal or hilar lymph nodes. Please note that accurate exclusion of hilar  adenopathy is limited on noncontrast CT scans.  Lungs/Pleura: No right middle or lower lobe mass identified. No acute consolidative airspace disease to suggest hemorrhage from complete pulmonary infarction at this time. 6 mm nodule in the superior segment of the left lower lobe (image 20 of series 3). Linear areas of mild scarring throughout the lung bases bilaterally.  Upper Abdomen: Status post cholecystectomy. 3.8 cm right adrenal mass is indeterminate (14 HU). Multiple low-attenuation lesions in the upper pole of the left kidney, largest of which measures 7.7 cm in diameter; these are incompletely characterized on today's non contrast CT examination, but are favored to represent cysts.  Musculoskeletal: Lucent lesion in the inferior aspect of the manubrium with associated nondisplaced pathologic fracture best appreciated on sagittal reconstructions.  IMPRESSION: 1. Massive saddle embolus extending into the lobar and segmental sized branches of both lungs. There is dilatation of the pulmonic trunk, and probable dilatation of the right heart, indicative of elevated pulmonary artery pressures and likely developing right-sided heart strain.  2. Aggressive appearing lytic lesion with pathologic fracture through the was inferior aspect of the manubrium. Statistically, this is likely to represent a metastatic lesion, and further evaluation to identified potential source of primary malignancy is suggested. 3. 3.8 cm right adrenal mass is indeterminate. While this may simply represent a lipid poor adenoma, but given the appearance of the manubrium, the possibility of a metastatic lesion or primary malignancy is not excluded. 4. 6 mm nodule in the superior segment of the left lower lobe. Attention on followup studies is recommended, as this could represent a metastatic lesion or a primary neoplasm, although this is highly nonspecific at this time. Critical Value/emergent results were called by telephone at the time of interpretation on 12/31/2013 at 6:56 PM to Dr. Davonna Belling , who verbally acknowledged these results.   Electronically Signed   By: Vinnie Langton M.D.   On: 12/31/2013 19:08   Dg Chest Port 1 View  01/01/2014   CLINICAL DATA:  Pulmonary embolism.  EXAM: PORTABLE CHEST - 1 VIEW  COMPARISON:  CT CHEST W/O CM dated 12/31/2013; DG CHEST 1V PORT dated 12/31/2013  FINDINGS: Mediastinum is normal. Stable right pulmonary arterial prominence is again noted as noted on recent chest CT in this patient with severe pulmonary emboli. Stable cardiomegaly and pulmonary venous structures present. Left lung base subsegmental atelectasis. No pleural effusion or pneumothorax. No acute osseous abnormality.  IMPRESSION: 1. Stable cardiomegaly. 2. Left base subsegmental atelectasis.   Electronically Signed   By: Marcello Moores  Register   On: 01/01/2014 08:03   Dg Chest Port 1 View  12/31/2013   CLINICAL DATA:  Hypoxia.  Fatigue.  EXAM: PORTABLE CHEST - 1 VIEW  COMPARISON:  Chest x-ray 12/28/2013.  FINDINGS: There is a masslike appearance of the right infrahilar region. Lungs otherwise appear clear. No pleural effusions. No evidence of pulmonary edema. Mild  cardiomegaly discretion borderline cardiomegaly, likely accentuated by portable AP technique. Atherosclerosis in the thoracic aorta.  IMPRESSION: 1. Masslike appearance of the right infrahilar region. The possibility of underlying pulmonary mass and/or right sided lymphadenopathy is not excluded, and correlation with contrast enhanced chest CT is suggested at this time. Given the patient's history of hypoxia, PE protocol CT scan would be optimal to evaluate the anatomy associated with this finding, as well as to exclude underlying pulmonary embolism if clinically appropriate. 2. Atherosclerosis. These results were called by telephone at the time of interpretation on 12/31/2013 at 6:02 PM to Dr. Davonna Belling, who verbally  acknowledged these results.   Electronically Signed   By: Vinnie Langton M.D.   On: 12/31/2013 18:04      CT Chest: massive saddle pulmonary embolism with lytic lesion on manubrium Venous doppler RLE DVT popliteal, distal femoral  ASSESSMENT / PLAN:  Principal Problem:   Acute massive pulmonary embolism Active Problems:   DM type 2 (diabetes mellitus, type 2)   Shock circulatory   Acute respiratory failure   Lumbar disc disease RLE DVT  PULMONARY A: Massive Saddle Pulmonary embolus with probable right heart strain also RLE DVT  - provoked in setting of right DVT with recent immobilization and possible malignancy  Pulmonary Nodule    - 6 mm left lower lobe. Pt is non-smoker.   - stable HR and BP but still on 50% face mask. With age, and hypoxemia and malignancy and renal failure  - PESI score likely high and indicating high 30 day mortality - highh risk tpa candidate due to bleeding risk at mets  P:  Oxygen supplementation to keep SpO2 > 92%  Start IV heparin - Moderate bleeding risk for TPA  d/t concern bleeding with probable metastatic cancer; avoid if possible Obtain 2D-echo to assess for right heart strain   CARDIOVASCULAR A: Hypotension in setting of  massive PE  - improved after IVF bolus Probable right heart strain in setting of massive PE    - i-stat trop mildly elevated 0.21, 12-lead EKG with no RBBB  P:  Obtain 2D-Echo Hold home tribenzor   RENAL A:  Acute Kidney Injury / Failure   - Cr 2.8 on admission, baseline 1.3 most likely pre-renal azotemia in setting of hypotension from PE. UA unremarkable.   - getting worse  P:   500cc bolus Monitor BMP NS 100 m//hr  (s/p 2 L bolus in ED) Consider kayexalate if no improvement in K Foley in place Rrenal [place] if no improvement of renal function  GASTROINTESTINAL A: Chronic Diarrhea  - in setting of metformin use  History of cholecystectomy History of diverticulosis  History of hiatal hernia  Morbid Obesity  - BMI 46 GERD P:   Heart healthy diet  Famotidine 20 mg daily   ONCOLOGICAL A:  Provoked acute right nonocclusive DVT   -posterior tibial, popliteal, distal femoral vein. Provoked in setting of recent immolization and possible malignancy Osteolytic lesion of manubrium  - concerning for metastasis vs MM Pulmonary and adrenal masses  P:  Start IV heparin  Monitor CBC Obtain protein electrophoresis and SPEP/UPEP to assess for MM (back pain, anemia, renal failure) Follow-up for work-up of osteolytic lesion and pulmonary/adrenal masses (patient expreses that she would like to know diagnosis through biipsy whenmedically stable)  \ HEME  Normocytic Anemia   -Hg stable, no active bleeding  P PRBC for hgb < 7gm% only  INFECTIOUS A:  Leukocytosis with neutrophilia   - likely secondary to PE, PCT level 0.17 reassuring  P:   Monitor off abxx  ENDOCRINE A:  Non-insulin dependent Type II DM - last A1c 5.9 on 07/19/13  Hypothyroidism on replacement therapy  -Last TSH 0.641 on 10/30/13 Possible right kidney stones   - per lumbar spine imaging 10/03/13  P:   Hold metformin and saxagliptin  SSI, monitor CBG IV 50 mcg synthroid daily  F/u of adrenal  mass  MUSCULOSKELETAL A: Degenerative facet disease in the lower lumbar spine with anterolisthesis at L4-L5 Osteoarthritis  Nontophaceous Gout   - no recent flare History of b/l knee replacement  P:  Surgery scheduled for 4/20 is postponed Hold home norco/vicodin in setting of hypotension; but restart when complains of pain Restart allopurinol once PO Hold home zanaflex ; consider restarting when c/o pain\ Palliative care forpain mgmt   NEUROLOGIC A: Peripheral Neuropathy  - likely due to diabetes P:   Hold home gabapentin 300 mg TID but resstart when c/o pain Palliative care for pain mgmt   GLOBAL Broke bad news of likely advanced cancer to her. She said she is not afraid of dying. Wants to have biopsy when stable. She started crying thinking of how beloved multiple family members esp grand kids and great grand kids will react to her bad news and impending short term life expectancy. She is requesting pastor. She is agreeable to palliative care extra layer of support for symptom mgmt, breaking bad news to family, spiritual support, and discussing goals of care. She has high 30 day mortality and we might never get in position to do biopsy  Keep in ICU. Let her eat     The patient is critically ill with multiple organ systems failure and requires high complexity decision making for assessment and support, frequent evaluation and titration of therapies, application of advanced monitoring technologies and extensive interpretation of multiple databases.   Critical Care Time devoted to patient care services described in this note is  35  Minutes.  Dr. Brand Knox, M.D., [facility].C.P Pulmonary and Critical Care Medicine Staff Physician [facility] Pager: [phone], If no answer or between  15:00h - 7:00h: call [phone]  01/01/2014 9:04 AM

## 2014-01-01 NOTE — Progress Notes (Signed)
UR Completed.  [name] [name] [phone] 01/01/2014  

## 2014-01-01 NOTE — Progress Notes (Addendum)
   Thank you for consulting the Palliative Medicine Team at [facility] to meet your patient's and family's needs.   The reason that you asked [place] to see your patient is  For  Clarification of [place] and options  We have scheduled your patient for a meeting:  Tomorrow 01-02-14 @ 0900 with Wadie Lessen NP  The Surrogate decision make is: Daughter Bing Ree, Dynasty Holquin  Other family members that need to be present: Grand-daughter and several others  Your patient is able/unable to participate: able to participate  Wadie Lessen NP  Palliative Medicine Team Team Phone # [phone] Pager [phone]

## 2014-01-01 NOTE — Progress Notes (Signed)
Chaplain responded to spiritual consult. Pt and several family members were present. Pt stated they were "okay," and did not need support. Chaplain available if needs change.

## 2014-01-01 NOTE — Progress Notes (Signed)
ANTICOAGULATION CONSULT NOTE - Follow Up Consult   HL = 0.35 (goal 0.3 - 0.7 units/mL) Heparin dosing weight = 73 kg   Assessment: 83 YOF with massive saddle PE and DVT to continue on IV heparin.  Heparin level therapeutic but toward the low end of normal.  No bleeding reported.   Plan: - Increase heparin gtt to 1500 units/hr - F/U AM labs    Leeon Makar D. Mina Marble, PharmD, BCPS Pager:  [phone] 01/01/2014, 7:59 PM

## 2014-01-01 NOTE — Progress Notes (Signed)
ANTICOAGULATION CONSULT NOTE - Follow Up Consult  Pharmacy Consult for Heparin  Indication: pulmonary embolus and DVT  Allergies  Allergen Reactions  . Ace Inhibitors Cough  . Byetta 10 Mcg Pen [Exenatide] Other (See Comments)    Pancreatitis 02/2005   Patient Measurements: Height: 5' (152.4 cm) Weight: 249 lb 5.4 oz (113.1 kg) IBW/kg (Calculated) : 45.5 Heparin Dosing Weight: 72.5 kg  Labs:  Recent Labs  12/31/13 1705 12/31/13 1729 12/31/13 2254 01/01/14 0210 01/01/14 0220  HGB 11.4* 13.3  --   --  9.4*  HCT 36.5 39.0  --   --  29.9*  PLT 206  --   --   --  174  HEPARINUNFRC  --   --   --   --  0.33  CREATININE 2.80* 3.00* 2.28*  --  2.20*  TROPONINI  --   --  0.39* 0.60*  --    Estimated Creatinine Clearance: 23 ml/min (by C-G formula based on Cr of 2.2).  Medications:  Heparin 1200 units/hr  Assessment: 80 y/o F on heparin for DVT/PE. First HL is 0.33. Noted drop in Hgb, monitor closely, other labs as above.   Goal of Therapy:  Heparin level 0.3-0.7 units/ml Monitor platelets by anticoagulation protocol: Yes   Plan:  -Continue heparin at 1200 units/hr -1200 HL to confirm -Daily CBC/HL -Monitor for bleeding, trend Hgb  Amber Knox 01/01/2014,3:50 AM

## 2014-01-02 DIAGNOSIS — M549 Dorsalgia, unspecified: Secondary | ICD-10-CM

## 2014-01-02 DIAGNOSIS — C7970 Secondary malignant neoplasm of unspecified adrenal gland: Secondary | ICD-10-CM

## 2014-01-02 DIAGNOSIS — Z515 Encounter for palliative care: Secondary | ICD-10-CM

## 2014-01-02 DIAGNOSIS — M79609 Pain in unspecified limb: Secondary | ICD-10-CM

## 2014-01-02 DIAGNOSIS — M79604 Pain in right leg: Secondary | ICD-10-CM

## 2014-01-02 DIAGNOSIS — N19 Unspecified kidney failure: Secondary | ICD-10-CM

## 2014-01-02 DIAGNOSIS — I359 Nonrheumatic aortic valve disorder, unspecified: Secondary | ICD-10-CM

## 2014-01-02 LAB — URINE CULTURE
COLONY COUNT: NO GROWTH
Culture: NO GROWTH

## 2014-01-02 LAB — CBC WITH DIFFERENTIAL/PLATELET
BASOS ABS: 0 10*3/uL (ref 0.0–0.1)
Basophils Relative: 0 % (ref 0–1)
EOS PCT: 3 % (ref 0–5)
Eosinophils Absolute: 0.3 10*3/uL (ref 0.0–0.7)
HCT: 30.9 % — ABNORMAL LOW (ref 36.0–46.0)
Hemoglobin: 9.5 g/dL — ABNORMAL LOW (ref 12.0–15.0)
Lymphocytes Relative: 28 % (ref 12–46)
Lymphs Abs: 3 10*3/uL (ref 0.7–4.0)
MCH: 28.8 pg (ref 26.0–34.0)
MCHC: 30.7 g/dL (ref 30.0–36.0)
MCV: 93.6 fL (ref 78.0–100.0)
Monocytes Absolute: 0.9 10*3/uL (ref 0.1–1.0)
Monocytes Relative: 9 % (ref 3–12)
NEUTROS ABS: 6.3 10*3/uL (ref 1.7–7.7)
Neutrophils Relative %: 60 % (ref 43–77)
PLATELETS: 173 10*3/uL (ref 150–400)
RBC: 3.3 MIL/uL — ABNORMAL LOW (ref 3.87–5.11)
RDW: 15 % (ref 11.5–15.5)
WBC: 10.5 10*3/uL (ref 4.0–10.5)

## 2014-01-02 LAB — MAGNESIUM: MAGNESIUM: 1.7 mg/dL (ref 1.5–2.5)

## 2014-01-02 LAB — HEPARIN LEVEL (UNFRACTIONATED)
HEPARIN UNFRACTIONATED: 0.24 [IU]/mL — AB (ref 0.30–0.70)
Heparin Unfractionated: 0.45 IU/mL (ref 0.30–0.70)
Heparin Unfractionated: 0.48 IU/mL (ref 0.30–0.70)

## 2014-01-02 LAB — BASIC METABOLIC PANEL
BUN: 24 mg/dL — ABNORMAL HIGH (ref 6–23)
CALCIUM: 8.5 mg/dL (ref 8.4–10.5)
CO2: 20 mEq/L (ref 19–32)
Chloride: 111 mEq/L (ref 96–112)
Creatinine, Ser: 1.37 mg/dL — ABNORMAL HIGH (ref 0.50–1.10)
GFR calc non Af Amer: 35 mL/min — ABNORMAL LOW (ref >90)
GFR, EST AFRICAN AMERICAN: 41 mL/min — AB (ref >90)
Glucose, Bld: 93 mg/dL (ref 70–99)
Potassium: 5.2 mEq/L (ref 3.7–5.3)
SODIUM: 144 meq/L (ref 137–147)

## 2014-01-02 LAB — GLUCOSE, CAPILLARY
GLUCOSE-CAPILLARY: 167 mg/dL — AB (ref 70–99)
GLUCOSE-CAPILLARY: 103 mg/dL — AB (ref 70–99)
Glucose-Capillary: 118 mg/dL — ABNORMAL HIGH (ref 70–99)

## 2014-01-02 LAB — PRO B NATRIURETIC PEPTIDE: Pro B Natriuretic peptide (BNP): 4037 pg/mL — ABNORMAL HIGH (ref 0–450)

## 2014-01-02 LAB — PROCALCITONIN: Procalcitonin: <0.1 ng/mL

## 2014-01-02 LAB — PHOSPHORUS: Phosphorus: 3.3 mg/dL (ref 2.3–4.6)

## 2014-01-02 MED ORDER — MAGNESIUM SULFATE 40 MG/ML IJ SOLN
2.0000 g | Freq: Once | INTRAMUSCULAR | Status: AC
  Administered 2014-01-02: 2 g via INTRAVENOUS
  Filled 2014-01-02: qty 50

## 2014-01-02 MED ORDER — OXYCODONE HCL 5 MG PO TABS
5.0000 mg | ORAL_TABLET | ORAL | Status: DC | PRN
  Administered 2014-01-02: 5 mg via ORAL
  Filled 2014-01-02: qty 1

## 2014-01-02 MED ORDER — OXYCODONE HCL 5 MG PO TABS
5.0000 mg | ORAL_TABLET | ORAL | Status: DC | PRN
  Administered 2014-01-02 – 2014-01-05 (×9): 5 mg via ORAL
  Filled 2014-01-02 (×9): qty 1

## 2014-01-02 MED ORDER — COLESTIPOL HCL 1 G PO TABS
2.0000 g | ORAL_TABLET | Freq: Two times a day (BID) | ORAL | Status: DC
  Administered 2014-01-02: 2 g via ORAL
  Filled 2014-01-02 (×4): qty 2

## 2014-01-02 MED ORDER — LIDOCAINE 5 % EX PTCH
1.0000 | MEDICATED_PATCH | CUTANEOUS | Status: DC
  Administered 2014-01-02 – 2014-01-05 (×4): 1 via TRANSDERMAL
  Filled 2014-01-02 (×4): qty 1

## 2014-01-02 MED ORDER — COLESTIPOL HCL 1 G PO TABS
2.0000 g | ORAL_TABLET | Freq: Two times a day (BID) | ORAL | Status: DC
  Administered 2014-01-02 – 2014-01-03 (×2): 2 g via ORAL
  Filled 2014-01-02: qty 2

## 2014-01-02 MED ORDER — DEXAMETHASONE 4 MG PO TABS
4.0000 mg | ORAL_TABLET | Freq: Every day | ORAL | Status: DC
  Administered 2014-01-02 – 2014-01-05 (×4): 4 mg via ORAL
  Filled 2014-01-02 (×4): qty 1

## 2014-01-02 NOTE — Consult Note (Signed)
Patient UK:Amber Knox      DOB: March 30, 1930      YHC:[id number]     Consult Note from the Palliative Medicine Team at [facility] Requested by: Dr Purnell Shoemaker     PCP: Ival Bible, MD Reason for Consultation: Clarification of [place] and options    Phone Number:[phone]  Assessment of patients Current state:    Patient and family faced with the processing of the difficult news regarding her present medical situation, advanced directive decisions and anticipatory care needs    Consult is for review of medical treatment options, clarification of goals of care and end of life issues, disposition and options, and symptom recommendation.  This NP Wadie Lessen reviewed medical records, received report from team, assessed the patient and then meet at the patient's bedside along with her family to include her daughter Bing Ree # [phone], son Harold Mattes # [phone] and granddaughter Melody # [phone] to discuss diagnosis, prognosis, GOC, and options and symptom management strategies  A  discussion was had today regarding advanced directives.  Concepts specific to code status  was had.  Values and goals of care important to patient and family were attempted to be elicited.  Concept of Palliative Care was discussed   Questions and concerns addressed.   Family encouraged to call with questions or concerns.  PMT will continue to support holistically.   Goals of Care: 1.  Code Status:  DNR/DNI   2. Scope of Treatment:  Patient is hopeful for improvement.  Plan is medical management of PE and take one day at a time as it relates to further investigation of cancer (biopsy) and possible treatment options.    She clearly expresses to her family quality of life is priority, that she is not afraid to die.  3. Disposition:  Dependant on outcomes   4. Symptom Management:    1. Pain--   Oxycodone IR 5 mg every 3 hrs prn Dexamethasone 4 mg po daily in morning Lidoderm 5% patch  to affect area  2. Bowel Regimen: Colestipol 1 GM two tablets bid  5. Psychosocial: Emotional support offered to patient and family.  This had been a complete surprise to all, she came to hospital for elective back surgery and is now faced with a likely cancer diagnosis.  They are a close family and support patient in whatever decisions are right for her  6. Spiritual: Strong community church support   Brief EVO:Amber Knox is a 83-yo-female with pmh T2DM, HTN, HL, hypothyroidism, OA, osteopenia, lumbar back pain scheduled for right L2-3, right L4-5 microdiscectomy on 4/20 who presented with 1 day history of lightheadedness, weakness, and decreased urine output. Per family pt was feeling her normal self until this morning when she began feeling "drunk" and lightheaded with weakness and fatigue. Pt has been having 3 month history of low back pain and was in rehab facility from March 13-April 9 with immobilization and inability to ambulate with right leg leg pain since being home. Pt was scheduled for right L2-3, right L4-5 microdiscectomy tomorrow and underwent pre-op evaluation on 4/16 that was normal.  In the ED pt was found to have blood pressure of 75/54 with pO2 of 80% on RA with CT chest w/o contrast findings of massive saddle pulmonary embolus. Pt was given IVF's and supplemental oxygen (4L via NRB) in the ED with improvement of blood pressure and hypoxia.   IMPRESSION: CT further outlines current situation 1. Massive saddle embolus extending  into the lobar and segmental  sized branches of both lungs. There is dilatation of the pulmonic  trunk, and probable dilatation of the right heart, indicative of  elevated pulmonary artery pressures and likely developing  right-sided heart strain.  2. Aggressive appearing lytic lesion with pathologic fracture  through the was inferior aspect of the manubrium. Statistically,  this is likely to represent a metastatic lesion, and further  evaluation  to identified potential source of primary malignancy is  suggested.  3. 3.8 cm right adrenal mass is indeterminate. While this may simply  represent a lipid poor adenoma, but given the appearance of the  manubrium, the possibility of a metastatic lesion or primary  malignancy is not excluded.  4. 6 mm nodule in the superior segment of the left lower lobe.  Attention on followup studies is recommended, as this could  represent a metastatic lesion or a primary neoplasm, although this  is highly nonspecific at this time.    ROS: lower back and left leg pain, cold feet    PMH:  Past Medical History  Diagnosis Date  . DM type 2 (diabetes mellitus, type 2)   . Hyperlipemia   . Hypertension   . Osteopenia     Lumbar spine  . Hx of osteoarthritis     bilateral knee osteoarthritis  -   bilateral knee replacement  . Colon polyps     Tubular Adenoma  . Diverticulosis     patient unaware   . Family history of anesthesia complication     Daughter, Son and Yolanda Bonine - slow to awaken  . Peripheral neuropathy   . Hiatal hernia   . Heart murmur     no need to follow  . GERD (gastroesophageal reflux disease)     not on meds, tums prn  . Arthritis   . Hypothyroidism      PSH: Past Surgical History  Procedure Laterality Date  . Abdominal hysterectomy      excessive bleeding (IUD)  . Cataract extraction Right   . Cholecystectomy    . Joint replacement    . Cardiac catheterization  1995    [facility] - clean  . Eye surgery Right     Cataract  . Knee arthroplasty Bilateral   . Tonsillectomy    . Carpal tunnel release Right    I have reviewed the FH and SH and  If appropriate update it with new information. Allergies  Allergen Reactions  . Ace Inhibitors Cough  . Byetta 10 Mcg Pen [Exenatide] Other (See Comments)    Pancreatitis 02/2005   Scheduled Meds: . famotidine (PEPCID) IV  20 mg Intravenous Q24H  . insulin aspart  2-6 Units Subcutaneous 6 times per day  . levothyroxine  50  mcg Intravenous QAC breakfast   Continuous Infusions: . sodium chloride 100 mL/hr at 12/31/13 2300  . heparin 1,700 Units/hr (01/02/14 0732)   PRN Meds:.sodium chloride    BP 111/93  Pulse 81  Temp(Src) 99 F (37.2 C) (Oral)  Resp 22  Ht 5' (1.524 m)  Wt 114 kg (251 lb 5.2 oz)  BMI 49.08 kg/m2  SpO2 94%   PPS:  30 % at best   Intake/Output Summary (Last 24 hours) at 01/02/14 0912 Last data filed at 01/02/14 0900  Gross per 24 hour  Intake 3617.4 ml  Output   5590 ml  Net -1972.6 ml    Physical Exam:  General:  Chronically ill appearing, NAD HEENT:  Mm, no exudate Chest:  CTA CVS: RRR Abdomen: obese, +BS Ext: without edeam Neuro:  Alert and oriented X3  Labs: CBC    Component Value Date/Time   WBC 10.5 01/02/2014 0246   RBC 3.30* 01/02/2014 0246   HGB 9.5* 01/02/2014 0246   HCT 30.9* 01/02/2014 0246   PLT 173 01/02/2014 0246   MCV 93.6 01/02/2014 0246   MCH 28.8 01/02/2014 0246   MCHC 30.7 01/02/2014 0246   RDW 15.0 01/02/2014 0246   LYMPHSABS 3.0 01/02/2014 0246   MONOABS 0.9 01/02/2014 0246   EOSABS 0.3 01/02/2014 0246   BASOSABS 0.0 01/02/2014 0246    BMET    Component Value Date/Time   NA 144 01/02/2014 0246   K 5.2 01/02/2014 0246   CL 111 01/02/2014 0246   CO2 20 01/02/2014 0246   GLUCOSE 93 01/02/2014 0246   BUN 24* 01/02/2014 0246   CREATININE 1.37* 01/02/2014 0246   CREATININE 1.38* 10/30/2013 0849   CALCIUM 8.5 01/02/2014 0246   GFRNONAA 35* 01/02/2014 0246   GFRNONAA 36* 10/30/2013 0849   GFRAA 41* 01/02/2014 0246   GFRAA 41* 10/30/2013 0849    CMP     Component Value Date/Time   NA 144 01/02/2014 0246   K 5.2 01/02/2014 0246   CL 111 01/02/2014 0246   CO2 20 01/02/2014 0246   GLUCOSE 93 01/02/2014 0246   BUN 24* 01/02/2014 0246   CREATININE 1.37* 01/02/2014 0246   CREATININE 1.38* 10/30/2013 0849   CALCIUM 8.5 01/02/2014 0246   PROT 6.7 12/31/2013 1705   ALBUMIN 3.4* 12/31/2013 1705   AST 30 12/31/2013 1705   ALT 30 12/31/2013 1705   ALKPHOS 70  12/31/2013 1705   BILITOT 0.3 12/31/2013 1705   GFRNONAA 35* 01/02/2014 0246   GFRNONAA 36* 10/30/2013 0849   GFRAA 41* 01/02/2014 0246   GFRAA 41* 10/30/2013 0849      Time In Time Out Total Time Spent with Patient Total Overall Time  0900 1045 90 min 105 min    Greater than 50%  of this time was spent counseling and coordinating care related to the above assessment and plan.   Wadie Lessen NP  Palliative Medicine Team Team Phone # [phone] Pager [phone]  Discussed with Dr Purnell Shoemaker

## 2014-01-02 NOTE — Progress Notes (Signed)
Pharmacy Note-Anticoagulation  Pharmacy Consult :  80 y.o. female is currently on Heparin infusion for Massive PE and RLE DVT.  Latest Labs : Hematology :  Recent Labs  12/31/13 1705 12/31/13 1729 12/31/13 2254 01/01/14 0220 01/01/14 1045 01/01/14 1056 01/01/14 1925 01/02/14 0246 01/02/14 1230  HGB 11.4* 13.3  --  9.4*  --   --   --  9.5*  --   HCT 36.5 39.0  --  29.9*  --   --   --  30.9*  --   PLT 206  --   --  174  --   --   --  173  --   HEPARINUNFRC  --   --   --  0.33  --  0.20* 0.35 0.24* 0.45  CREATININE 2.80* 3.00* 2.28* 2.20* 2.01*  --   --  1.37*  --     Current Medication[s] Include:  Scheduled:  Scheduled:  . colestipol  2 g Oral BID  . dexamethasone  4 mg Oral Daily  . famotidine (PEPCID) IV  20 mg Intravenous Q24H  . levothyroxine  50 mcg Intravenous QAC breakfast  . lidocaine  1 patch Transdermal Q24H   Infusion[s]: Infusions:  . sodium chloride 50 mL/hr at 01/02/14 1000  . heparin 1,700 Units/hr (01/02/14 0732)    Assessment :  Heparin infusing at 1700 units/hr  Heparin level within therapeutic range > 0.45 units/ml.  .  No evidence of bleeding complications observed.  Goal :  Heparin goal is Heparin level 0.3-0.7 units/ml.  Plan : 1. Heparin will be continued at the same rate.   The next Heparin Level will be checked in 8 hours to confirm maintaining therapeutic levels. 2. Daily Heparin level, CBC while on Heparin.  Monitor for bleeding complications. Follow Platelet counts.  Estelle June, Pharm.D. 01/02/2014  3:21 PM

## 2014-01-02 NOTE — Progress Notes (Signed)
ANTICOAGULATION CONSULT NOTE - Follow Up Consult  Pharmacy Consult for Heparin  Indication: pulmonary embolus and DVT  Allergies  Allergen Reactions  . Ace Inhibitors Cough  . Byetta 10 Mcg Pen [Exenatide] Other (See Comments)    Pancreatitis 02/2005   Patient Measurements: Height: 5' (152.4 cm) Weight: 249 lb 9 oz (113.2 kg) IBW/kg (Calculated) : 45.5 Heparin Dosing Weight: 72.5 kg  Labs:  Recent Labs  12/31/13 1705 12/31/13 1729 12/31/13 2254 01/01/14 0210  01/01/14 0220 01/01/14 1045 01/01/14 1056 01/01/14 1925 01/02/14 0246  HGB 11.4* 13.3  --   --   --  9.4*  --   --   --  9.5*  HCT 36.5 39.0  --   --   --  29.9*  --   --   --  30.9*  PLT 206  --   --   --   --  174  --   --   --  173  HEPARINUNFRC  --   --   --   --   < > 0.33  --  0.20* 0.35 0.24*  CREATININE 2.80* 3.00* 2.28*  --   --  2.20* 2.01*  --   --   --   TROPONINI  --   --  0.39* 0.60*  --   --  0.41*  --   --   --   < > = values in this interval not displayed. Estimated Creatinine Clearance: 25.2 ml/min (by C-G formula based on Cr of 2.01).  Medications:  Heparin 1500 units/hr  Assessment: 80 y/o F on heparin for DVT/PE. HL is 0.24 despite rate increase. Noted drop in Hgb from 4/19, monitor closely, other labs as above.   Goal of Therapy:  Heparin level 0.3-0.7 units/ml Monitor platelets by anticoagulation protocol: Yes   Plan:  -Increase heparin drip to 1700 units/hr -1230 HL to confirm -Daily CBC/HL -Monitor for bleeding, trend Hgb  [name] 01/02/2014,4:09 AM

## 2014-01-02 NOTE — Progress Notes (Signed)
Name: Amber Knox  MRN: [id number]  DOB: 07-09-1930   ADMISSION DATE: 12/31/2013  CONSULTATION DATE: 12/31/2013   REFERRING MD : EDP  PRIMARY SERVICE: PCCM   CHIEF COMPLAINT: Hypotension   BRIEF PATIENT DESCRIPTION:  Amber Knox is a 83-yo-female with pmh T2DM, HTN, HL, hypothyroidism, OA, osteopenia, lumbar back pain scheduled for right L2-3, right L4-5 microdiscectomy on 4/20 who presented with 1 day history of lightheadedness, weakness, and decreased urine output. Per family pt was feeling her normal self until this morning when she began feeling "drunk" and lightheaded with weakness and fatigue. Pt has been having 3 month history of low back pain and was in rehab facility from March 13-April 9 with immobilization and inability to ambulate with right leg leg pain since being home. Pt was scheduled for right L2-3, right L4-5 microdiscectomy tomorrow and underwent pre-op evaluation on 4/16 that was normal. She reports no shortness of breath, cough, URI symptoms, fevers, or sick contacts. She also denies chest pain, tachycardia, hemoptysis, or syncope. She has been having decreased appetite for some time without reported weight change. She reports no history of blood clots.  In the ED pt was found to have blood pressure of 75/54 with pO2 of 80% on RA with CT chest w/o contrast findings of massive saddle pulmonary embolus. Pt was given IVF's and supplemental oxygen (4L via NRB) in the ED with improvement of blood pressure and hypoxia.   LINES / TUBES:  PIV 4/9 >>  Foley Catheter 4/19   CULTURES:  Blood cultures 4/19 >>   ANTIBIOTICS:  Vancomycin 4/19>>4/19  Zosyn 4/19 >>4/19    SIGNIFICANT EVENTS / STUDIES:  4/19 Admitted with massive saddle pulmonary embolus, started on IV heparin  4/19 CT chest w/o cont with massive saddle embolus extending into the lobar and segmental sized branches of both lungs with likely developing right heart strain. Osteolytic lesion of manubrium, 3.8 cm right  adrenal mass, 6 mm nodule in left lower pulmonary lobe   SUBJECTIVE/OVERNIGHT/INTERVAL HX  01/01/14: Prescheduled back surgery for today will not happen. Mostly BP okay, HR 74, Pulsue ox 94% on 50% face mask, not confused. Got emotional on hearing news of likely advanced cancer   01/02/14: Improved to 6L [place]. Palliative care meeting in progress with whole family. Still with back pain and unable to turn   VITAL SIGNS:   Filed Vitals:   01/02/14 0600 01/02/14 0730 01/02/14 0800 01/02/14 0900  BP: 138/44  136/53 111/93  Pulse: 78  77 81  Temp:  99 F (37.2 C)    TempSrc:  Oral    Resp: 13  12 22   Height:      Weight:      SpO2: 97%  96% 94%    IO I/O last 3 completed shifts: In: 5329.4 [P.O.:480; I.V.:3849.4; IV Piggyback:1000] Out: 6010 [Urine:6010]   Intake/Output Summary (Last 24 hours) at 01/02/14 0942 Last data filed at 01/02/14 0914  Gross per 24 hour  Intake 3467.4 ml  Output   5590 ml  Net -2122.6 ml      PHYSICAL EXAMINATION:  General: Obese woman lying in bed on NRB  Neuro: A & O x 3, no focal deficits  HEENT: Normocephalic, nontraumatic, dry mucous membranes  Cardiovascular: distant heart sounds, normal rate and rhythm, systolic murmur  Lungs: Clear to ausculation bilaterally. No wheezing, ronchi, or rales  Abdomen: Soft, non-distended, tenderness to palpation of right middle quadrant, normal BS, no peritoneal signs  Musculoskeletal: Moving all extremities, b/l  LE +1 edema, tenderness to palpation of right LE up to thigh, Homan's sign positive  Skin: Warm, dry    LABS:   PULMONARY  Recent Labs Lab 12/31/13 1729 12/31/13 1908 01/01/14 0509  PHART  --  7.238* 7.252*  PCO2ART  --  47.5* 48.9*  PO2ART  --  172.0* 84.0  HCO3  --  20.3 21.7  TCO2 21 22 23   O2SAT  --  99.0 95.0    CBC  Recent Labs Lab 12/31/13 1705 12/31/13 1729 01/01/14 0220 01/02/14 0246  HGB 11.4* 13.3 9.4* 9.5*  HCT 36.5 39.0 29.9* 30.9*  WBC 17.1*  --  14.1* 10.5  PLT  206  --  174 173    COAGULATION No results found for this basename: INR,  in the last 168 hours  CARDIAC   Recent Labs Lab 12/31/13 2254 01/01/14 0210 01/01/14 1045  TROPONINI 0.39* 0.60* 0.41*    Recent Labs Lab 01/02/14 0246  PROBNP 4037.0*     CHEMISTRY  Recent Labs Lab 12/31/13 1705 12/31/13 1729 12/31/13 2254 01/01/14 0220 01/01/14 1045 01/02/14 0246  NA 133* 130* 134* 134* 139 144  K 6.1* 5.8* 5.5* 5.5* 5.5* 5.2  CL 97 99 102 103 106 111  CO2 17*  --  18* 17* 21 20  GLUCOSE 168* 168* 108* 93 93 93  BUN 43* 41* 38* 38* 34* 24*  CREATININE 2.80* 3.00* 2.28* 2.20* 2.01* 1.37*  CALCIUM 9.0  --  7.7* 7.7* 8.4 8.5  MG  --   --   --   --   --  1.7  PHOS  --   --   --   --   --  3.3   Estimated Creatinine Clearance: 37.1 ml/min (by C-G formula based on Cr of 1.37).   LIVER  Recent Labs Lab 12/31/13 1705  AST 30  ALT 30  ALKPHOS 70  BILITOT 0.3  PROT 6.7  ALBUMIN 3.4*     INFECTIOUS  Recent Labs Lab 12/31/13 1705 12/31/13 1730 01/01/14 0220 01/01/14 0632 01/02/14 0246  LATICACIDVEN  --  4.16*  --  1.2  --   PROCALCITON 0.17  --  0.14  --  <0.10     ENDOCRINE CBG (last 3)   Recent Labs  01/01/14 2339 01/02/14 0340 01/02/14 0735  GLUCAP 93 103* 118*         IMAGING x48h  Ct Chest Wo Contrast  12/31/2013   CLINICAL DATA:  Hypotension. Possible sepsis. Lethargy. Possible right infrahilar mass noted on recent chest x-ray.  EXAM: CT CHEST WITHOUT CONTRAST  TECHNIQUE: Multidetector CT imaging of the chest was performed following the standard protocol without IV contrast.  COMPARISON:  No priors.  Chest x-ray 12/31/2013.  FINDINGS: Mediastinum: Although today's study is a noncontrast CT examination, when the viewing windows are narrowed, there is clear evidence of a massive saddle embolus extending into lobar and segmental sized pulmonary artery branches to the lungs bilaterally. The apparent hilar enlargement noted on the recent  chest radiograph was presumably secondary to this finding (so called "Fleischner sign"). Dilatation of the pulmonic trunk (3.6 cm in diameter, and probable dilatation of the right heart (difficult to assess on today's non contrast CT examination), indicative of pulmonary arterial hypertension and likely elevated right-sided heart pressures. Heart size is borderline enlarged. There is no significant pericardial fluid, thickening or pericardial calcification. There is atherosclerosis of the thoracic aorta, the great vessels of the mediastinum and the coronary arteries, including calcified atherosclerotic plaque  in the left main, left anterior descending, left circumflex and right coronary arteries. No pathologically enlarged mediastinal or hilar lymph nodes. Please note that accurate exclusion of hilar adenopathy is limited on noncontrast CT scans.  Lungs/Pleura: No right middle or lower lobe mass identified. No acute consolidative airspace disease to suggest hemorrhage from complete pulmonary infarction at this time. 6 mm nodule in the superior segment of the left lower lobe (image 20 of series 3). Linear areas of mild scarring throughout the lung bases bilaterally.  Upper Abdomen: Status post cholecystectomy. 3.8 cm right adrenal mass is indeterminate (14 HU). Multiple low-attenuation lesions in the upper pole of the left kidney, largest of which measures 7.7 cm in diameter; these are incompletely characterized on today's non contrast CT examination, but are favored to represent cysts.  Musculoskeletal: Lucent lesion in the inferior aspect of the manubrium with associated nondisplaced pathologic fracture best appreciated on sagittal reconstructions.  IMPRESSION: 1. Massive saddle embolus extending into the lobar and segmental sized branches of both lungs. There is dilatation of the pulmonic trunk, and probable dilatation of the right heart, indicative of elevated pulmonary artery pressures and likely developing  right-sided heart strain. 2. Aggressive appearing lytic lesion with pathologic fracture through the was inferior aspect of the manubrium. Statistically, this is likely to represent a metastatic lesion, and further evaluation to identified potential source of primary malignancy is suggested. 3. 3.8 cm right adrenal mass is indeterminate. While this may simply represent a lipid poor adenoma, but given the appearance of the manubrium, the possibility of a metastatic lesion or primary malignancy is not excluded. 4. 6 mm nodule in the superior segment of the left lower lobe. Attention on followup studies is recommended, as this could represent a metastatic lesion or a primary neoplasm, although this is highly nonspecific at this time. Critical Value/emergent results were called by telephone at the time of interpretation on 12/31/2013 at 6:56 PM to Dr. Davonna Belling , who verbally acknowledged these results.   Electronically Signed   By: Vinnie Langton M.D.   On: 12/31/2013 19:08   US Renal Port  01/02/2014   CLINICAL DATA:  Elevated creatinine  EXAM: RENAL/URINARY TRACT ULTRASOUND COMPLETE  COMPARISON:  None.  FINDINGS: Right Kidney:  Length: 11.9 cm in length. No hydronephrosis. Normal cortical echogenicity. Multiple benign appearing cysts. No mass. Mild cortical thinning.  Left Kidney:  Length: 13.1 cm in length. Normal cortical echogenicity. No hydronephrosis or mass. Benign appearing cysts. The largest is 6.9 cm in the upper pole.  Bladder:  Foley catheter decompresses the bladder.  IMPRESSION: No evidence of hydronephrosis.  Benign cysts.   Electronically Signed   By: Maryclare Bean M.D.   On: 01/02/2014 00:58   Dg Chest Port 1 View  01/01/2014   CLINICAL DATA:  Pulmonary embolism.  EXAM: PORTABLE CHEST - 1 VIEW  COMPARISON:  CT CHEST W/O CM dated 12/31/2013; DG CHEST 1V PORT dated 12/31/2013  FINDINGS: Mediastinum is normal. Stable right pulmonary arterial prominence is again noted as noted on recent chest CT in  this patient with severe pulmonary emboli. Stable cardiomegaly and pulmonary venous structures present. Left lung base subsegmental atelectasis. No pleural effusion or pneumothorax. No acute osseous abnormality.  IMPRESSION: 1. Stable cardiomegaly. 2. Left base subsegmental atelectasis.   Electronically Signed   By: Marcello Moores  Register   On: 01/01/2014 08:03   Dg Chest Port 1 View  12/31/2013   CLINICAL DATA:  Hypoxia.  Fatigue.  EXAM: PORTABLE CHEST - 1 VIEW  COMPARISON:  Chest x-ray 12/28/2013.  FINDINGS: There is a masslike appearance of the right infrahilar region. Lungs otherwise appear clear. No pleural effusions. No evidence of pulmonary edema. Mild cardiomegaly discretion borderline cardiomegaly, likely accentuated by portable AP technique. Atherosclerosis in the thoracic aorta.  IMPRESSION: 1. Masslike appearance of the right infrahilar region. The possibility of underlying pulmonary mass and/or right sided lymphadenopathy is not excluded, and correlation with contrast enhanced chest CT is suggested at this time. Given the patient's history of hypoxia, PE protocol CT scan would be optimal to evaluate the anatomy associated with this finding, as well as to exclude underlying pulmonary embolism if clinically appropriate. 2. Atherosclerosis. These results were called by telephone at the time of interpretation on 12/31/2013 at 6:02 PM to Dr. Davonna Belling, who verbally acknowledged these results.   Electronically Signed   By: Vinnie Langton M.D.   On: 12/31/2013 18:04    CT Chest: massive saddle pulmonary embolism with lytic lesion on manubrium   Venous doppler RLE DVT popliteal, distal femoral     ASSESSMENT / PLAN:  Principal Problem:  Acute massive pulmonary embolism  Active Problems:  DM type 2 (diabetes mellitus, type 2)  Shock circulatory  Acute respiratory failure  Lumbar disc disease  RLE DVT    PULMONARY  A: Sub-Massive Saddle Pulmonary embolus with probable right heart strain  also RLE distal femoral and popliteal DVT ; high PESI score at admit - provoked in setting of right DVT with recent immobilization and undiagnosed new advanced malignancy  Pulmonary Nodule  - 6 mm left lower lobe. Pt is non-smoker.  - stable HR and BP but still on 50% face mask.   P:  Oxygen supplementation to keep SpO2 > 92%   IV heparin to continue - Moderate bleeding risk for systemic  TPA d/t concern bleeding with probable metastatic cancer; avoid if possible  - Could consider local tPA if worsens Await  2D-echo to assess for right heart strain  - Hold off IVC filter given improvement and distal nature of clot   CARDIOVASCULAR  A: Transient Hypotension in setting of massive PE at admit - improved after IVF bolus   PLAN Await 2D-Echo  Hold home tribenzor    RENAL  A: Acute Kidney Injury / Failure . Nrmal renal US other than cysts - Cr 2.8 on admission, baseline 1.3 most likely pre-renal azotemia in setting of hypotension from PE. UA unremarkable.  - getting better with fluids  P:  500cc bolus  Monitor BMP  NS 100 m//hr (s/p 2 L bolus in ED) reduce to 50cc/h Foley in place     GASTROINTESTINAL  A: Chronic Diarrhea  - in setting of metformin use  History of cholecystectomy  History of diverticulosis  History of hiatal hernia  Morbid Obesity  - BMI 46  GERD  P:  Heart healthy diet  Famotidine 20 mg daily    ONCOLOGICAL  A: Osteolytic lesion of manubrium  - concerning for metastasis vs MM . D/w Dr Pascal Lux of IR on 01/02/2014: too  Small to bx and recommends more imaging Adrenal masses   P:  Await  protein electrophoresis and SPEP/UPEP to assess for MM (back pain, anemia, renal failure)  Follow-up for work-up of osteolytic lesion and pulmonary/adrenal masses  Obtain CT Abd and pelvis when better with renal function to evaluate for malignancy    HEME  Normocytic Anemia   no active bleeding  P  PRBC for hgb < 7gm% only   INFECTIOUS  A: No evidence of  active infection P:  Monitor off abx  ENDOCRINE  A:  Non-insulin dependent Type II DM  - last A1c 5.9 on 07/19/13  Hypothyroidism on replacement therapy  -Last TSH 0.641 on 10/30/13  Possible right kidney stones  - per lumbar spine imaging 10/03/13 . Not seen renal US 01/01/14 P:  Hold metformin and saxagliptin  SSI, monitor CBG  IV 50 mcg synthroid daily  F/u of adrenal mass   MUSCULOSKELETAL  A: Degenerative facet disease in the lower lumbar spine with anterolisthesis at L4-L5  Osteoarthritis  Nontophaceous Gout  - no recent flare  History of b/l knee replacement  P:  Surgery scheduled for 4/20 is postponed  Pain mgmt per Palliative Restart allopurinol once PO      NEUROLOGIC  A: Peripheral Neuropathy  - likely due to diabetes  P:  Hold home gabapentin 300 mg TID but resstart when c/o pain  Palliative care for pain mgmt    GLOBAL  01/01/14: Broke bad news of likely advanced cancer to her. She said she is not afraid of dying. Wants to have biopsy when stable. She started crying thinking of how beloved multiple family members esp grand kids and great grand kids will react to her bad news and impending short term life expectancy. She is requesting pastor. She is agreeable to palliative care extra layer of support for symptom mgmt, breaking bad news to family, spiritual support, and discussing goals of care. She has high 30 day mortality and we might never get in position to do biopsy  Keep in ICU. Let her eat   01/02/14: Move to sdu. Updated family. Hold offi IVC filter. Consider local tpa if worsens. IR does niot thnk sternal lesion is ideal for biopsy. Will need to image abdomen once she is more stable  The patient is critically ill with multiple organ systems failure and requires high complexity decision making for assessment and support, frequent evaluation and titration of therapies, application of advanced monitoring technologies and extensive interpretation of multiple  databases.  Critical Care Time devoted to patient care services described in this note is 4 Minutes including family time   Dr. Brand Males, M.D., [facility] Pulmonary and Critical Care Medicine Staff Physician [facility] Pager: [phone], If no answer or between  15:00h - 7:00h: call [phone]  01/02/2014 9:44 AM

## 2014-01-02 NOTE — Progress Notes (Signed)
  Echocardiogram 2D Echocardiogram has been performed.  Amber Knox 01/02/2014, 12:53 PM

## 2014-01-02 NOTE — Progress Notes (Signed)
Pharmacy Consult - Heparin  Heparin level therapeutic this PM  Plan: 1) Continue heparin at current rate. 2) Follow up AM labs  Thank you. Anette Guarneri, PharmD [phone]

## 2014-01-03 LAB — BASIC METABOLIC PANEL
BUN: 20 mg/dL (ref 6–23)
CHLORIDE: 104 meq/L (ref 96–112)
CO2: 23 mEq/L (ref 19–32)
Calcium: 8.9 mg/dL (ref 8.4–10.5)
Creatinine, Ser: 1.4 mg/dL — ABNORMAL HIGH (ref 0.50–1.10)
GFR calc Af Amer: 40 mL/min — ABNORMAL LOW (ref >90)
GFR calc non Af Amer: 34 mL/min — ABNORMAL LOW (ref >90)
Glucose, Bld: 147 mg/dL — ABNORMAL HIGH (ref 70–99)
Potassium: 5 mEq/L (ref 3.7–5.3)
Sodium: 140 mEq/L (ref 137–147)

## 2014-01-03 LAB — CBC WITH DIFFERENTIAL/PLATELET
Basophils Absolute: 0 10*3/uL (ref 0.0–0.1)
Basophils Relative: 0 % (ref 0–1)
Eosinophils Absolute: 0 10*3/uL (ref 0.0–0.7)
Eosinophils Relative: 0 % (ref 0–5)
HCT: 29 % — ABNORMAL LOW (ref 36.0–46.0)
Hemoglobin: 9.3 g/dL — ABNORMAL LOW (ref 12.0–15.0)
LYMPHS PCT: 18 % (ref 12–46)
Lymphs Abs: 1.8 10*3/uL (ref 0.7–4.0)
MCH: 29.4 pg (ref 26.0–34.0)
MCHC: 32.1 g/dL (ref 30.0–36.0)
MCV: 91.8 fL (ref 78.0–100.0)
MONOS PCT: 7 % (ref 3–12)
Monocytes Absolute: 0.6 10*3/uL (ref 0.1–1.0)
Neutro Abs: 7.2 10*3/uL (ref 1.7–7.7)
Neutrophils Relative %: 75 % (ref 43–77)
Platelets: 188 10*3/uL (ref 150–400)
RBC: 3.16 MIL/uL — ABNORMAL LOW (ref 3.87–5.11)
RDW: 14.6 % (ref 11.5–15.5)
WBC: 9.7 10*3/uL (ref 4.0–10.5)

## 2014-01-03 LAB — PROTEIN ELECTROPHORESIS, SERUM
ALBUMIN ELP: 54.3 % — AB (ref 55.8–66.1)
Alpha-1-Globulin: 6.8 % — ABNORMAL HIGH (ref 2.9–4.9)
Alpha-2-Globulin: 18.3 % — ABNORMAL HIGH (ref 7.1–11.8)
BETA 2: 4.5 % (ref 3.2–6.5)
BETA GLOBULIN: 7.3 % — AB (ref 4.7–7.2)
Gamma Globulin: 8.8 % — ABNORMAL LOW (ref 11.1–18.8)
M-Spike, %: NOT DETECTED g/dL
Total Protein ELP: 5.3 g/dL — ABNORMAL LOW (ref 6.0–8.3)

## 2014-01-03 LAB — UIFE/LIGHT CHAINS/TP QN, 24-HR UR
ALPHA 1 UR: NOT DETECTED
Albumin, U: DETECTED
Alpha 2, Urine: NOT DETECTED
Beta, Urine: NOT DETECTED
FREE KAPPA LT CHAINS, UR: 8.21 mg/dL — AB (ref 0.14–2.42)
Free Kappa/Lambda Ratio: 8.21 ratio (ref 2.04–10.37)
Free Lambda Lt Chains,Ur: 1 mg/dL — ABNORMAL HIGH (ref 0.02–0.67)
GAMMA UR: NOT DETECTED
Total Protein, Urine: 13.6 mg/dL

## 2014-01-03 LAB — MAGNESIUM: Magnesium: 2 mg/dL (ref 1.5–2.5)

## 2014-01-03 LAB — PHOSPHORUS: Phosphorus: 2.7 mg/dL (ref 2.3–4.6)

## 2014-01-03 LAB — HEPARIN LEVEL (UNFRACTIONATED): HEPARIN UNFRACTIONATED: 0.45 [IU]/mL (ref 0.30–0.70)

## 2014-01-03 MED ORDER — LEVOTHYROXINE SODIUM 112 MCG PO TABS
112.0000 ug | ORAL_TABLET | Freq: Every day | ORAL | Status: DC
  Administered 2014-01-04: 112 ug via ORAL
  Filled 2014-01-03 (×3): qty 1

## 2014-01-03 NOTE — Consult Note (Signed)
I have reviewed this case with our NP and agree with the Assessment and Plan as stated.  Duanne Duchesne L. Chrishana Spargur, MD MBA The Palliative Medicine Team at [place] Team Phone: [phone] Pager: [phone]   

## 2014-01-03 NOTE — Progress Notes (Signed)
Name: CYNDY ACQUISTO  MRN: [id number]  DOB: [birth year]   ADMISSION DATE: 12/31/2013  CONSULTATION DATE: 12/31/2013   REFERRING MD : EDP  PRIMARY SERVICE: PCCM   CHIEF COMPLAINT: Hypotension   BRIEF PATIENT DESCRIPTION:  Amber Knox is a 83-yo-female with pmh T2DM, HTN, HL, hypothyroidism, OA, osteopenia, lumbar back pain scheduled for right L2-3, right L4-5 microdiscectomy on 4/20 who presented with 1 day history of lightheadedness, weakness, and decreased urine output. Per family pt was feeling her normal self until this morning when she began feeling "drunk" and lightheaded with weakness and fatigue. Pt has been having 3 month history of low back pain and was in rehab facility from March 13-April 9 with immobilization and inability to ambulate with right leg leg pain since being home. Pt was scheduled for right L2-3, right L4-5 microdiscectomy tomorrow and underwent pre-op evaluation on 4/16 that was normal. She reports no shortness of breath, cough, URI symptoms, fevers, or sick contacts. She also denies chest pain, tachycardia, hemoptysis, or syncope. She has been having decreased appetite for some time without reported weight change. She reports no history of blood clots.  In the ED pt was found to have blood pressure of 75/54 with pO2 of 80% on RA with CT chest w/o contrast findings of massive saddle pulmonary embolus. Pt was given IVF's and supplemental oxygen (4L via NRB) in the ED with improvement of blood pressure and hypoxia.   LINES / TUBES:  PIV 4/9 >>  Foley Catheter 4/19   CULTURES:  Blood cultures 4/19 >>   ANTIBIOTICS:  Vancomycin 4/19>>4/19  Zosyn 4/19 >>4/19    SIGNIFICANT EVENTS / STUDIES:  4/19 Admitted with massive saddle pulmonary embolus, started on IV heparin  4/19 CT chest w/o cont with massive saddle embolus extending into the lobar and segmental sized branches of both lungs with likely developing right heart strain. Osteolytic lesion of manubrium, 3.8 cm right  adrenal mass, 6 mm nodule in left lower pulmonary lobe   01/01/14: Prescheduled back surgery for today will not happen. Mostly BP okay, HR 74, Pulsue ox 94% on 50% face mask, not confused. Got emotional on hearing news of likely advanced cancer   01/02/14: Improved to 6L [place]. Palliative care meeting in progress with whole family. Still with back pain and unable to turn. ECHO - Normal LVEF and RVEF and PASP and only mild RV dilatation    SUBJECTIVE/OVERNIGHT/INTERVAL HX 01/03/14: Now DNR, DNI but full medical care with goal of finding cancer diagnosis. Pallitive care managing pain. Vitals good. Did not end up in SDU. Echo findings noted and very reassuring . Daughter at bedside. Pain improved but c/o gaseous abdomen. Reduced appetite +  VITAL SIGNS:   Filed Vitals:   01/03/14 0500 01/03/14 0600 01/03/14 0700 01/03/14 0735  BP: 129/74 159/58 150/55   Pulse: 65 62 63   Temp:    100.6 F (38.1 C)  TempSrc:    Oral  Resp: 14 12 16    Height:      Weight:  113.5 kg (250 lb 3.6 oz)    SpO2: 97% 98% 98%     IO I/O last 3 completed shifts: In: 4443.4 [P.O.:1260; I.V.:3033.4; IV Piggyback:150] Out: 5827 [Urine:5825; Stool:2]   Intake/Output Summary (Last 24 hours) at 01/03/14 1040 Last data filed at 01/03/14 0700  Gross per 24 hour  Intake   2417 ml  Output   2446 ml  Net    -29 ml      PHYSICAL EXAMINATION:  General: Obese woman lying in bed on NRB  Neuro: A & O x 3, no focal deficits  HEENT: Normocephalic, nontraumatic, dry mucous membranes  Cardiovascular: distant heart sounds, normal rate and rhythm, systolic murmur  Lungs: Clear to ausculation bilaterally. No wheezing, ronchi, or rales  Abdomen: Soft, non-distended, tenderness to palpation of right middle quadrant, normal BS, no peritoneal signs  Musculoskeletal: Moving all extremities, b/l LE +1 edema,  Skin: Warm, dry    LABS:   PULMONARY  Recent Labs Lab 12/31/13 1729 12/31/13 1908 01/01/14 0509  PHART  --   7.238* 7.252*  PCO2ART  --  47.5* 48.9*  PO2ART  --  172.0* 84.0  HCO3  --  20.3 21.7  TCO2 21 22 23   O2SAT  --  99.0 95.0    CBC  Recent Labs Lab 01/01/14 0220 01/02/14 0246 01/03/14 0258  HGB 9.4* 9.5* 9.3*  HCT 29.9* 30.9* 29.0*  WBC 14.1* 10.5 9.7  PLT 174 173 188    COAGULATION No results found for this basename: INR,  in the last 168 hours  CARDIAC    Recent Labs Lab 12/31/13 2254 01/01/14 0210 01/01/14 1045  TROPONINI 0.39* 0.60* 0.41*    Recent Labs Lab 01/02/14 0246  PROBNP 4037.0*     CHEMISTRY  Recent Labs Lab 12/31/13 2254 01/01/14 0220 01/01/14 1045 01/02/14 0246 01/03/14 0258  NA 134* 134* 139 144 140  K 5.5* 5.5* 5.5* 5.2 5.0  CL 102 103 106 111 104  CO2 18* 17* 21 20 23   GLUCOSE 108* 93 93 93 147*  BUN 38* 38* 34* 24* 20  CREATININE 2.28* 2.20* 2.01* 1.37* 1.40*  CALCIUM 7.7* 7.7* 8.4 8.5 8.9  MG  --   --   --  1.7 2.0  PHOS  --   --   --  3.3 2.7   Estimated Creatinine Clearance: 36.2 ml/min (by C-G formula based on Cr of 1.4).   LIVER  Recent Labs Lab 12/31/13 1705  AST 30  ALT 30  ALKPHOS 70  BILITOT 0.3  PROT 6.7  ALBUMIN 3.4*     INFECTIOUS  Recent Labs Lab 12/31/13 1705 12/31/13 1730 01/01/14 0220 01/01/14 0632 01/02/14 0246  LATICACIDVEN  --  4.16*  --  1.2  --   PROCALCITON 0.17  --  0.14  --  <0.10     ENDOCRINE CBG (last 3)   Recent Labs  01/01/14 2339 01/02/14 0340 01/02/14 0735  GLUCAP 93 103* 118*         IMAGING x48h  US Renal Port  01/02/2014   CLINICAL DATA:  Elevated creatinine  EXAM: RENAL/URINARY TRACT ULTRASOUND COMPLETE  COMPARISON:  None.  FINDINGS: Right Kidney:  Length: 11.9 cm in length. No hydronephrosis. Normal cortical echogenicity. Multiple benign appearing cysts. No mass. Mild cortical thinning.  Left Kidney:  Length: 13.1 cm in length. Normal cortical echogenicity. No hydronephrosis or mass. Benign appearing cysts. The largest is 6.9 cm in the upper pole.   Bladder:  Foley catheter decompresses the bladder.  IMPRESSION: No evidence of hydronephrosis.  Benign cysts.   Electronically Signed   By: Maryclare Bean M.D.   On: 01/02/2014 00:58    CT Chest: massive saddle pulmonary embolism with lytic lesion on manubrium   Venous doppler RLE DVT popliteal, distal femoral     ASSESSMENT / PLAN:  Principal Problem:  Acute massive pulmonary embolism  Active Problems:  DM type 2 (diabetes mellitus, type 2)  Shock circulatory  Acute respiratory failure  Lumbar disc disease  RLE DVT    PULMONARY  A: Sub-Massive Saddle Pulmonary embolus with probable right heart strain also RLE distal femoral and popliteal DVT ; high PESI score at admit - provoked in setting of right DVT with recent immobilization and undiagnosed new advanced malignancy  Pulmonary Nodule  - 6 mm left lower lobe. Pt is non-smoker.  - stable HR and BP but  On 6L [place]  P:  Oxygen supplementation to keep SpO2 > 92% ; titrated down to 4L [place]  IV heparin to continue - Moderate bleeding risk for systemic  TPA d/t concern bleeding with probable metastatic cancer; avoid if possible  - Could consider local tPA if worsens - Hold off IVC filter given improvement and distal nature of clot - depending on course and creatinine, need  To sort out oral anticoagulation strategy of coumadin v new Xa drugs    CARDIOVASCULAR  A: Transient Hypotension in setting of massive PE at admit, resolved after IVF   PLAN Hold home tribenzor but aim to restart 01/04/14   RENAL  A: Acute Kidney Injury / Failure . Nrmal renal US other than cysts - Cr 2.8 on admission, baseline 1.3 most likely pre-renal azotemia in setting of hypotension from PE. UA unremarkable.    - still impaired though better. GFR borderline and precludes new oral Xa drugs  P:  Monitor BMP  Change fluids to kvo Foley in place     GASTROINTESTINAL  A: Chronic Diarrhea  - in setting of metformin use  History of cholecystectomy   History of diverticulosis  History of hiatal hernia  Morbid Obesity  - BMI 46  GERD  P:  Heart healthy diet  Famotidine 20 mg daily    ONCOLOGICAL  A: Osteolytic lesion of manubrium (per patient MRI LS spine April 2015 at [facility]: did not speak of malignancy) -. Patient concerned of pancreatic cancer due to family hx - concerning for metastasis vs MM . D/w Dr Pascal Lux of IR on 01/03/2014: too  Small to bx and recommends more imaging Adrenal masses with 34mm Pulmonary nodule   P:  Await  protein electrophoresis and SPEP/UPEP to assess for MM (back pain, anemia, renal failure)  Follow-up for work-up of osteolytic lesion and pulmonary/adrenal masses  Depending on course and creatinine: Obtain CT Abd and pelvis this admission Vs OPD PET scan    HEME  Normocytic Anemia   no active bleeding  P  PRBC for hgb < 7gm% only   INFECTIOUS  A: No evidence of active infection P:  Monitor off abx  ENDOCRINE  A:  Non-insulin dependent Type II DM  - last A1c 5.9 on 07/19/13  Hypothyroidism on replacement therapy  -Last TSH 0.641 on 10/30/13  Possible right kidney stones  - per lumbar spine imaging 10/03/13 . Not seen renal US 01/01/14 P:  Hold metformin and saxagliptin  SSI, monitor CBG  Home po  synthroid daily  F/u of adrenal mass   MUSCULOSKELETAL  A: Degenerative facet disease in the lower lumbar spine with anterolisthesis at L4-L5  Osteoarthritis  Nontophaceous Gout  - no recent flare  History of b/l knee replacement    - having pain  P:  Surgery scheduled for 4/20 is postponed ; needs to be addressed at dc Pain mgmt per Palliative Restart allopurinol once PO but on/after 01/04/14     NEUROLOGIC  A: Peripheral Neuropathy  - likely due to diabetes  P:  Hold home gabapentin 300 mg TID but resstart  when c/o pain - will need reduced dose due to low GFR Palliative care for pain mgmt to opine on neurontin dose   GLOBAL  01/01/14: Broke bad news of likely advanced cancer to  her. She said she is not afraid of dying. Wants to have biopsy when stable. She started crying thinking of how beloved multiple family members esp grand kids and great grand kids will react to her bad news and impending short term life expectancy. She is requesting pastor. She is agreeable to palliative care extra layer of support for symptom mgmt, breaking bad news to family, spiritual support, and discussing goals of care. She has high 30 day mortality and we might never get in position to do biopsy  Keep in ICU. Let her eat   01/02/14: Move to sdu. Updated family. Hold offi IVC filter. Consider local tpa if worsens. IR does niot thnk sternal lesion is ideal for biopsy. Will need to image abdomen once she is more stable  01/03/14: improved . Move to floor. TRH pick up. It might well be that she cannot have CT abd due to CRI and might need PET scan as opd and cancer workup as opd. Might have to go on coumadin as oppsed to Xa inhibitors due to CRI. Daughter and patient updated. PCCM might see one more dayfor overlap   Dr. Brand Males, M.D., [facility].C.P Pulmonary and Critical Care Medicine Staff Physician [facility] Pager: [phone], If no answer or between  15:00h - 7:00h: call [phone]  01/03/2014 10:40 AM

## 2014-01-03 NOTE — Progress Notes (Signed)
Pt to transfer to 3E-13 via bed. Family and belongings at bedside. Meds in chart, VS stable Pain med given prior to transfer. No questions or concerns at this time. Amber Knox

## 2014-01-03 NOTE — Progress Notes (Signed)
Progress Note from the Palliative Medicine Team at [facility]:   -patient resting comfortably in bed, reports her pain and discomfort much improved with yesterdays mediations  changes   -she is tearful as she shares a life review telling her story through each of her family members,   -she speaks to understanding her long term poor prognosis, and expresses concern and hope for her family  -still in a watchful waiting period regarding her medical situation and hopes for improvement     Objective: Allergies  Allergen Reactions  . Ace Inhibitors Cough  . Byetta 10 Mcg Pen [Exenatide] Other (See Comments)    Pancreatitis 02/2005   Scheduled Meds: . colestipol  2 g Oral BID  . dexamethasone  4 mg Oral Daily  . famotidine (PEPCID) IV  20 mg Intravenous Q24H  . levothyroxine  50 mcg Intravenous QAC breakfast  . levothyroxine  112 mcg Oral Daily  . lidocaine  1 patch Transdermal Q24H   Continuous Infusions: . sodium chloride 50 mL/hr at 01/02/14 2244  . heparin 1,700 Units/hr (01/02/14 2238)   PRN Meds:.sodium chloride, oxyCODONE  BP 158/45  Pulse 78  Temp(Src) 100.6 F (38.1 C) (Oral)  Resp 19  Ht 5' (1.524 m)  Wt 113.5 kg (250 lb 3.6 oz)  BMI 48.87 kg/m2  SpO2 97%   PPS:30 % at best  Pain Score: 2/10 Pain Location right lower back and leg   Intake/Output Summary (Last 24 hours) at 01/03/14 1110 Last data filed at 01/03/14 1100  Gross per 24 hour  Intake   2788 ml  Output   2646 ml  Net    142 ml        Physical Exam:  General: ill appearing NAD HEENT:  Mm, no exudate Chest:   RRR CVS: decreased in bases Abdomen:obeses, NT + BS Ext:  Without edeam Neuro:alert and oriented X3  Labs: CBC    Component Value Date/Time   WBC 9.7 01/03/2014 0258   RBC 3.16* 01/03/2014 0258   HGB 9.3* 01/03/2014 0258   HCT 29.0* 01/03/2014 0258   PLT 188 01/03/2014 0258   MCV 91.8 01/03/2014 0258   MCH 29.4 01/03/2014 0258   MCHC 32.1 01/03/2014 0258   RDW 14.6  01/03/2014 0258   LYMPHSABS 1.8 01/03/2014 0258   MONOABS 0.6 01/03/2014 0258   EOSABS 0.0 01/03/2014 0258   BASOSABS 0.0 01/03/2014 0258    BMET    Component Value Date/Time   NA 140 01/03/2014 0258   K 5.0 01/03/2014 0258   CL 104 01/03/2014 0258   CO2 23 01/03/2014 0258   GLUCOSE 147* 01/03/2014 0258   BUN 20 01/03/2014 0258   CREATININE 1.40* 01/03/2014 0258   CREATININE 1.38* 10/30/2013 0849   CALCIUM 8.9 01/03/2014 0258   GFRNONAA 34* 01/03/2014 0258   GFRNONAA 36* 10/30/2013 0849   GFRAA 40* 01/03/2014 0258   GFRAA 41* 10/30/2013 0849    CMP     Component Value Date/Time   NA 140 01/03/2014 0258   K 5.0 01/03/2014 0258   CL 104 01/03/2014 0258   CO2 23 01/03/2014 0258   GLUCOSE 147* 01/03/2014 0258   BUN 20 01/03/2014 0258   CREATININE 1.40* 01/03/2014 0258   CREATININE 1.38* 10/30/2013 0849   CALCIUM 8.9 01/03/2014 0258   PROT 6.7 12/31/2013 1705   ALBUMIN 3.4* 12/31/2013 1705   AST 30 12/31/2013 1705   ALT 30 12/31/2013 1705   ALKPHOS 70 12/31/2013 1705   BILITOT 0.3  12/31/2013 1705   GFRNONAA 34* 01/03/2014 0258   GFRNONAA 36* 10/30/2013 0849   GFRAA 40* 01/03/2014 0258   GFRAA 41* 10/30/2013 0849    Assessment and Plan: 1. Code Status:DNR/DNI 2. Symptom Control  3. Pain--  O Oxy codone IR 5 mg every 3 hrs prn  Dexamethasone 4 mg po daily in morning  Lidoderm 5% patch to affect area Bowel Regimen: Colestipol 1 GM two tablets bid  4. Psycho/Social:  Emotional support offered to patient.  She expresses appreciation for the "time to share" 5.  Spiritual  Strong community church support  5. Disposition:  Dependant on outcomes     Time In Time Out Total Time Spent with Patient Total Overall Time  0800 0825 25 min 25 min    Greater than 50%  of this time was spent counseling and coordinating care related to the above assessment and plan.  Wadie Lessen NP  Palliative Medicine Team Team Phone # [phone] Pager [phone]   1

## 2014-01-03 NOTE — Care Management Note (Signed)
    Page 1 of 2   01/04/2014     2:28:16 PM CARE MANAGEMENT NOTE 01/04/2014  Patient:  Amber Knox, Amber Knox   Account Number:  [id number]  Date Initiated:  01/01/2014  Documentation initiated by:  [facility]  Subjective/Objective Assessment:   Massive PE//Home alone, children provide 24 hour care     Action/Plan:   Oxygen supplementation to keep SpO2 > 92%  ;Start IV heparin  - Moderate bleeding risk for TPA  d/t  bleeding with probable metastatic cancer; avoid if possible  Obtain 2D-echo to assess for right heart strain//Access for [facility] needs   Anticipated DC Date:  01/04/2014   Anticipated DC Plan:  [place]  In-house referral  Clinical Social Worker      DC Planning Services  CM consult      Choice offered to / List presented to:  C-1 Patient           Status of service:  In process, will continue to follow Medicare Important Message given?   (If response is "NO", the following Medicare IM given date fields will be blank) Date Medicare IM given:   Date Additional Medicare IM given:    Discharge Disposition:    Per UR Regulation:  Reviewed for med. necessity/level of care/duration of stay  If discussed at [place] of Stay Meetings, dates discussed:    Comments:  ContactLuis Knox Daughter [phone]   [phone]                 Amber Knox, Amber Knox [phone]                 Amber Knox,Amber Knox Grandaughter [phone]  01/04/14 [place], RN, BSN, [place] [phone] Spoke with pt and daughter Amber Knox) at bedside regarding discharge planning and need for [facility] services.  Amber Knox says that she can not care for mom 24 hours and pt states that she can not go home alone.  PT scheduled to see pt today. Will await direction from them as to placement/ [facility] needs.  01-03-14   [place] Talked with patient and daughter, Amber Knox in room. Patient has been on bedrest and has not been out of bed.  States she has not been out of bed since she left  the SNF and went home due to pain.  Daughter, and daughers husband and child have been taking turns staying 24 hours with patient prior to her coming to the hospital on this visit.  Both agree will needfor  rehab again prior to discharge.  DO not want [facility] where she was previously. SW consult placed.

## 2014-01-03 NOTE — Progress Notes (Signed)
Report called to Olevia Bowens RN.

## 2014-01-04 DIAGNOSIS — I10 Essential (primary) hypertension: Secondary | ICD-10-CM

## 2014-01-04 LAB — HEPARIN LEVEL (UNFRACTIONATED): HEPARIN UNFRACTIONATED: 0.61 [IU]/mL (ref 0.30–0.70)

## 2014-01-04 LAB — CBC WITH DIFFERENTIAL/PLATELET
BASOS ABS: 0 10*3/uL (ref 0.0–0.1)
Basophils Relative: 0 % (ref 0–1)
Eosinophils Absolute: 0.1 10*3/uL (ref 0.0–0.7)
Eosinophils Relative: 1 % (ref 0–5)
HEMATOCRIT: 29.8 % — AB (ref 36.0–46.0)
Hemoglobin: 9.5 g/dL — ABNORMAL LOW (ref 12.0–15.0)
LYMPHS PCT: 29 % (ref 12–46)
Lymphs Abs: 3.4 10*3/uL (ref 0.7–4.0)
MCH: 29.1 pg (ref 26.0–34.0)
MCHC: 31.9 g/dL (ref 30.0–36.0)
MCV: 91.4 fL (ref 78.0–100.0)
Monocytes Absolute: 0.9 10*3/uL (ref 0.1–1.0)
Monocytes Relative: 8 % (ref 3–12)
NEUTROS ABS: 7.5 10*3/uL (ref 1.7–7.7)
Neutrophils Relative %: 62 % (ref 43–77)
Platelets: 203 10*3/uL (ref 150–400)
RBC: 3.26 MIL/uL — ABNORMAL LOW (ref 3.87–5.11)
RDW: 14.7 % (ref 11.5–15.5)
WBC: 11.9 10*3/uL — AB (ref 4.0–10.5)

## 2014-01-04 LAB — BASIC METABOLIC PANEL
BUN: 21 mg/dL (ref 6–23)
CHLORIDE: 105 meq/L (ref 96–112)
CO2: 22 meq/L (ref 19–32)
Calcium: 9.3 mg/dL (ref 8.4–10.5)
Creatinine, Ser: 1.2 mg/dL — ABNORMAL HIGH (ref 0.50–1.10)
GFR calc Af Amer: 48 mL/min — ABNORMAL LOW (ref >90)
GFR calc non Af Amer: 41 mL/min — ABNORMAL LOW (ref >90)
Glucose, Bld: 106 mg/dL — ABNORMAL HIGH (ref 70–99)
POTASSIUM: 5 meq/L (ref 3.7–5.3)
Sodium: 141 mEq/L (ref 137–147)

## 2014-01-04 LAB — PHOSPHORUS: PHOSPHORUS: 3.7 mg/dL (ref 2.3–4.6)

## 2014-01-04 LAB — MAGNESIUM: MAGNESIUM: 1.8 mg/dL (ref 1.5–2.5)

## 2014-01-04 MED ORDER — GABAPENTIN 300 MG PO CAPS
300.0000 mg | ORAL_CAPSULE | Freq: Three times a day (TID) | ORAL | Status: DC
  Filled 2014-01-04 (×3): qty 1

## 2014-01-04 MED ORDER — FAMOTIDINE 20 MG PO TABS
20.0000 mg | ORAL_TABLET | Freq: Every day | ORAL | Status: DC
  Administered 2014-01-04 – 2014-01-05 (×2): 20 mg via ORAL
  Filled 2014-01-04 (×2): qty 1

## 2014-01-04 MED ORDER — ALLOPURINOL 300 MG PO TABS
300.0000 mg | ORAL_TABLET | Freq: Every day | ORAL | Status: DC
  Administered 2014-01-04 – 2014-01-05 (×2): 300 mg via ORAL
  Filled 2014-01-04 (×2): qty 1

## 2014-01-04 MED ORDER — AMLODIPINE BESYLATE 10 MG PO TABS
10.0000 mg | ORAL_TABLET | Freq: Every day | ORAL | Status: DC
  Administered 2014-01-04 – 2014-01-05 (×2): 10 mg via ORAL
  Filled 2014-01-04 (×2): qty 1

## 2014-01-04 MED ORDER — ENOXAPARIN SODIUM 120 MG/0.8ML ~~LOC~~ SOLN
105.0000 mg | Freq: Two times a day (BID) | SUBCUTANEOUS | Status: DC
  Administered 2014-01-04 – 2014-01-05 (×3): 105 mg via SUBCUTANEOUS
  Filled 2014-01-04 (×5): qty 0.8

## 2014-01-04 MED ORDER — POLYETHYLENE GLYCOL 3350 17 G PO PACK
17.0000 g | PACK | Freq: Every day | ORAL | Status: DC | PRN
  Filled 2014-01-04: qty 1

## 2014-01-04 MED ORDER — GABAPENTIN 300 MG PO CAPS
300.0000 mg | ORAL_CAPSULE | Freq: Two times a day (BID) | ORAL | Status: DC
  Administered 2014-01-05: 300 mg via ORAL
  Filled 2014-01-04 (×3): qty 1

## 2014-01-04 MED ORDER — SENNA 8.6 MG PO TABS
2.0000 | ORAL_TABLET | Freq: Every day | ORAL | Status: DC | PRN
  Filled 2014-01-04: qty 2

## 2014-01-04 NOTE — Evaluation (Signed)
Physical Therapy Evaluation Patient Details Name: Amber Knox MRN: [id number] DOB: Feb 04, 1930 Today's Date: 01/04/2014   History of Present Illness  Amber Knox is a 83-yo-female with pmh T2DM, HTN, HL, hypothyroidism, OA, osteopenia,  lumbar back pain scheduled for right L2-3, right L4-5 microdiscectomy on 4/20 who presented with 1 day history of lightheadedness, weakness, and decreased urine output. Per family pt was feeling her normal self until this morning when she began feeling "drunk" and lightheaded with weakness and fatigue. Pt has been having 3 month history of low back pain and was in rehab facility from March 13-April 9 with immobilization and inability to ambulate with right leg leg pain since being home.  Pt was scheduled for right L2-3, right L4-5 microdiscectomy  tomorrow and underwent pre-op evaluation on 4/16 that was normal. She reports no shortness of breath, cough, URI symptoms, fevers, or sick contacts. She also denies chest pain, tachycardia, hemoptysis, or syncope. She has been having decreased appetite for some time without reported weight change.  She reports no history of blood clots.   Clinical Impression  Pt adm with the above dx. Pt's functional mobility has recently declined this past month. Pt req assistance with ADLs PTA. Pt reports family is unavailable to provide proper level of assistance at home therefore it is recommended for pt to follow up with SNF upon d/c to maximize independence for safe return home. Pt to benefit from skilled acute PT to address limitations listed below.     Follow Up Recommendations SNF;Supervision/Assistance - 24 hour    Equipment Recommendations  None recommended by PT    Recommendations for Other Services       Precautions / Restrictions Precautions Precautions: Fall;Back Restrictions Weight Bearing Restrictions: No      Mobility  Bed Mobility Overal bed mobility: Needs Assistance Bed Mobility: Rolling;Sidelying to  Sit Rolling: Min guard Sidelying to sit: Min assist;HOB elevated       General bed mobility comments: pt req verbal cues for hand placement and sequencing for log roll. Pt req Bilat knees block for side lying to sit transfer to prevent sliding off EOB. pt req tactile and verbal cues for coming to sit.  pt became SOB upon sitting up initially req a resting break before advancing.   Transfers Overall transfer level: Needs assistance Equipment used: Rolling walker (2 wheeled) Transfers: Stand Pivot Transfers   Stand pivot transfers: Min assist       General transfer comment: pt req verbal cueing to push up off of bed and sequencing to RW. min A for balance control. verbal and tactile cues for pivot transfer. pt denies dizziness upon standing.   Ambulation/Gait                Stairs            Wheelchair Mobility    Modified Rankin (Stroke Patients Only)       Balance Overall balance assessment: Needs assistance Sitting-balance support: Feet supported;Bilateral upper extremity supported Sitting balance-Leahy Scale: Fair Sitting balance - Comments: pt more stable with Bilat UE support. pt became dizzy and SOB  upon sitting EOB but symptoms reduced after resting and verbal cues for deep breaths.    Standing balance support: Bilateral upper extremity supported Standing balance-Leahy Scale: Poor Standing balance comment: pt req Bilat UE support. verbal cues to achieve upright standing position  Pertinent Vitals/Pain Pt reports R LE pain. RN aware    Home Living Family/patient expects to be discharged to:: Skilled nursing facility Living Arrangements: Alone               Additional Comments: pt does not have adequate home assistance     Prior Function Level of Independence: Needs assistance   Gait / Transfers Assistance Needed: RW prior to 12/17/13 since then has not walked  ADL's / [place] Needed:  reports needed assist with ADLs dressing, bathing, bathroom...provided by daughters when available.   Comments: pt lives alone in a one story home with a ramp.      Hand Dominance        Extremity/Trunk Assessment   Upper Extremity Assessment: Overall WFL for tasks assessed           Lower Extremity Assessment: Generalized weakness;RLE deficits/detail RLE Deficits / Details: PMH DVT. reported thigh pain associated with back pain.        Communication   Communication: No difficulties  Cognition Arousal/Alertness: Awake/alert Behavior During Therapy: WFL for tasks assessed/performed Overall Cognitive Status: Within Functional Limits for tasks assessed                      General Comments General comments (skin integrity, edema, etc.): pt educated on d/c options and differences between CIR and SNF at end of session. pt became emotional to tears reporting family hx end of life plans. pt accepting and agreeing to d/c to SNF for adequate assistance.     Exercises General Exercises - Lower Extremity Ankle Circles/Pumps: AROM;Both;10 reps;Seated      Assessment/Plan    PT Assessment Patient needs continued PT services  PT Diagnosis Difficulty walking   PT Problem List Decreased strength;Decreased range of motion;Decreased activity tolerance;Decreased balance;Decreased mobility;Decreased coordination;Decreased knowledge of use of DME;Decreased safety awareness;Obesity  PT Treatment Interventions DME instruction;Gait training;Functional mobility training;Therapeutic activities;Therapeutic exercise;Balance training;Neuromuscular re-education;Patient/family education   PT Goals (Current goals can be found in the Care Plan section) Acute Rehab PT Goals Patient Stated Goal: improve health PT Goal Formulation: With patient Time For Goal Achievement: 01/11/14 Potential to Achieve Goals: Good    Frequency Min 3X/week   Barriers to discharge Decreased caregiver  support pt's family unavailable to provide level of assistance needed at home at this moment.     Co-evaluation               End of Session Equipment Utilized During Treatment: Gait belt;Oxygen Activity Tolerance: Patient tolerated treatment well Patient left: in chair;with call bell/phone within reach Nurse Communication: Mobility status         Time: 1761-6073 PT Time Calculation (min): 23 min   Charges:         PT G Codes:          Manley Mason SPT 01/04/2014, 3:27 PM

## 2014-01-04 NOTE — Progress Notes (Addendum)
ANTICOAGULATION CONSULT NOTE - Follow Up Consult  Pharmacy Consult for Heparin>>Lovenox Indication: PE and DVT  Allergies  Allergen Reactions  . Ace Inhibitors Cough  . Byetta 10 Mcg Pen [Exenatide] Other (See Comments)    Pancreatitis 02/2005    Patient Measurements: Height: 5' (152.4 cm) Weight: 237 lb 3.2 oz (107.593 kg) ([place]) IBW/kg (Calculated) : 45.5 Heparin Dosing Weight: 73 kg  Vital Signs: Temp: 98 F (36.7 C) (04/23 0701) Temp src: Oral (04/23 0701) BP: 148/66 mmHg (04/23 0701) Pulse Rate: 61 (04/23 0701)  Labs:  Recent Labs  01/01/14 1045  01/02/14 0246  01/02/14 2150 01/03/14 0258 01/04/14 0443  HGB  --   < > 9.5*  --   --  9.3* 9.5*  HCT  --   --  30.9*  --   --  29.0* 29.8*  PLT  --   --  173  --   --  188 203  HEPARINUNFRC  --   < > 0.24*  < > 0.48 0.45 0.61  CREATININE 2.01*  --  1.37*  --   --  1.40* 1.20*  TROPONINI 0.41*  --   --   --   --   --   --   < > = values in this interval not displayed.  Estimated Creatinine Clearance: 40.8 ml/min (by C-G formula based on Cr of 1.2).   Assessment: 80 y/o F presents with hypotension and fatigue, hypoxia. 4/19 CT: Massive saddle embolus, RLE distal femoral and popliteal DVT. other masses and nodules not excluding malignancy.  PMH: T2DM, HTN, HL, hypothyroidism, OA, osteopenia, lumbar back pain scheduled for right L2-3, right L4-5 microdiscectomy on 4/20  AC: Heparin for DVT/PE. No TPA d/t high bleeding risk w/malignancy. Heparin level 0.61. CBC stable.   Goal of Therapy:  Heparin level 0.3-0.7 units/ml Monitor platelets by anticoagulation protocol: Yes   Plan:  Per Dr. Sheran Fava, transition pt from heparin to Lovenox Lovenox 105mg  SQ BID CBC q72h.  Onesti Bonfiglio S. Alford Highland, PharmD, [facility] Clinical Staff Pharmacist Pager [phone]  [facility] Alford Highland 01/04/2014,8:25 AM

## 2014-01-04 NOTE — Evaluation (Signed)
Physical Therapy Evaluation Patient Details Name: Amber Knox MRN: [id number] DOB: [birth year] Today's Date: 01/04/2014   History of Present Illness  [name] is a 83-yo-female with pmh T2DM, HTN, HL, hypothyroidism, OA, osteopenia,  lumbar back pain scheduled for right L2-3, right L4-5 microdiscectomy on 4/20 who presented with 1 day history of lightheadedness, weakness, and decreased urine output. Per family pt was feeling her normal self until this morning when she began feeling "drunk" and lightheaded with weakness and fatigue. Pt has been having 3 month history of low back pain and was in rehab facility from March 13-April 9 with immobilization and inability to ambulate with right leg leg pain since being home.  Pt was scheduled for right L2-3, right L4-5 microdiscectomy  tomorrow and underwent pre-op evaluation on 4/16 that was normal. She reports no shortness of breath, cough, URI symptoms, fevers, or sick contacts. She also denies chest pain, tachycardia, hemoptysis, or syncope. She has been having decreased appetite for some time without reported weight change.  She reports no history of blood clots.   Clinical Impression  Pt adm with the above dx. Pt's functional mobility has recently declined this past month. Pt req assistance with ADLs PTA. Pt reports family is unavailable to provide proper level of assistance at home therefore it is recommended for pt to follow up with SNF upon d/c to maximize independence for safe return home. Pt to benefit from skilled acute PT to address limitations listed below.     Follow Up Recommendations SNF;Supervision/Assistance - 24 hour    Equipment Recommendations  None recommended by PT    Recommendations for Other Services       Precautions / Restrictions Precautions Precautions: Fall;Back Precaution Comments: educated on log rolling to prevent twisting  Restrictions Weight Bearing Restrictions: No      Mobility  Bed Mobility Overal  bed mobility: Needs Assistance Bed Mobility: Rolling;Sidelying to Sit Rolling: Min guard Sidelying to sit: Min assist;HOB elevated       General bed mobility comments: pt req verbal cues for hand placement and sequencing for log roll. Pt req Bilat knees block for side lying to sit transfer to prevent sliding off EOB. pt req tactile and verbal cues for coming to sit.  pt became SOB upon sitting up initially req a resting break before advancing.   Transfers Overall transfer level: Needs assistance Equipment used: Rolling walker (2 wheeled) Transfers: Stand Pivot Transfers   Stand pivot transfers: Min assist       General transfer comment: pt req verbal cueing to push up off of bed and sequencing to RW. min A for balance control. verbal and tactile cues for pivot transfer. pt denies dizziness upon standing.   Ambulation/Gait                Stairs            Wheelchair Mobility    Modified Rankin (Stroke Patients Only)       Balance Overall balance assessment: Needs assistance Sitting-balance support: Feet supported;Bilateral upper extremity supported Sitting balance-Leahy Scale: Fair Sitting balance - Comments: pt more stable with Bilat UE support. pt became dizzy and SOB  upon sitting EOB but symptoms reduced after resting and verbal cues for deep breaths.    Standing balance support: Bilateral upper extremity supported Standing balance-Leahy Scale: Poor Standing balance comment: pt req Bilat UE support. verbal cues to achieve upright standing position  Pertinent Vitals/Pain Pt reports R LE pain. RN aware    Home Living Family/patient expects to be discharged to:: Skilled nursing facility Living Arrangements: Alone               Additional Comments: pt does not have adequate home assistance     Prior Function Level of Independence: Needs assistance   Gait / Transfers Assistance Needed: RW prior to 12/17/13 since  then has not walked  ADL's / [place] Needed: reports needed assist with ADLs dressing, bathing, bathroom...provided by daughters when available.   Comments: pt lives alone in a one story home with a ramp.      Hand Dominance        Extremity/Trunk Assessment   Upper Extremity Assessment: Overall WFL for tasks assessed           Lower Extremity Assessment: Generalized weakness;RLE deficits/detail RLE Deficits / Details: PMH DVT. reported thigh pain associated with back pain.        Communication   Communication: No difficulties  Cognition Arousal/Alertness: Awake/alert Behavior During Therapy: Anxious Overall Cognitive Status: Within Functional Limits for tasks assessed                      General Comments General comments (skin integrity, edema, etc.): pt educated on d/c options and differences between CIR and SNF at end of session. pt became emotional to tears reporting family hx end of life plans. pt accepting and agreeing to d/c to SNF for adequate assistance.     Exercises General Exercises - Lower Extremity Ankle Circles/Pumps: AROM;Both;10 reps;Seated      Assessment/Plan    PT Assessment Patient needs continued PT services  PT Diagnosis Difficulty walking   PT Problem List Decreased strength;Decreased range of motion;Decreased activity tolerance;Decreased balance;Decreased mobility;Decreased coordination;Decreased knowledge of use of DME;Decreased safety awareness;Obesity  PT Treatment Interventions DME instruction;Gait training;Functional mobility training;Therapeutic activities;Therapeutic exercise;Balance training;Neuromuscular re-education;Patient/family education   PT Goals (Current goals can be found in the Care Plan section) Acute Rehab PT Goals Patient Stated Goal: improve health PT Goal Formulation: With patient Time For Goal Achievement: 01/11/14 Potential to Achieve Goals: Good    Frequency Min 3X/week   Barriers to  discharge Decreased caregiver support pt's family unavailable to provide level of assistance needed at home at this moment.     Co-evaluation               End of Session Equipment Utilized During Treatment: Gait belt;Oxygen Activity Tolerance: Patient tolerated treatment well Patient left: in chair;with call bell/phone within reach Nurse Communication: Mobility status         Time: 3532-9924 PT Time Calculation (min): 23 min   Charges:   PT Evaluation $Initial PT Evaluation Tier I: 1 Procedure PT Treatments $Therapeutic Activity: 8-22 mins   PT G Codes:          Manley Mason SPT  [place] [facility] [phone] 01/04/2014, 4:27 PM

## 2014-01-04 NOTE — Progress Notes (Signed)
Progress Note from the Palliative Medicine Team at [facility]:   -patient resting comfortably in bed, reports her pain and discomfort much improved  --still in a watchful waiting period regarding her medical situation and hopes for improvement  -patient and family contemplating discharge plans, discussed in detail anticipatory care needs     Objective: Allergies  Allergen Reactions  . Ace Inhibitors Cough  . Byetta 10 Mcg Pen [Exenatide] Other (See Comments)    Pancreatitis 02/2005   Scheduled Meds: . allopurinol  300 mg Oral Daily  . amLODipine  10 mg Oral Daily  . dexamethasone  4 mg Oral Daily  . enoxaparin (LOVENOX) injection  105 mg Subcutaneous Q12H  . famotidine  20 mg Oral Daily  . gabapentin  300 mg Oral BID  . levothyroxine  112 mcg Oral QAC breakfast  . lidocaine  1 patch Transdermal Q24H   Continuous Infusions: . sodium chloride 10 mL/hr at 01/03/14 1058   PRN Meds:.sodium chloride, oxyCODONE, polyethylene glycol, senna  BP 156/60  Pulse 69  Temp(Src) 98.3 F (36.8 C) (Oral)  Resp 19  Ht 5' (1.524 m)  Wt 107.593 kg (237 lb 3.2 oz)  BMI 46.32 kg/m2  SpO2 95%   PPS:30 % at best  Pain Score: 2/10 Pain Location right lower back and leg   Intake/Output Summary (Last 24 hours) at 01/04/14 1559 Last data filed at 01/04/14 1300  Gross per 24 hour  Intake    732 ml  Output   3100 ml  Net  -2368 ml        Physical Exam:  General: ill appearing NAD HEENT:  Mm, no exudate Chest:   RRR CVS: decreased in bases Abdomen:obeses, NT + BS Ext:  Without edeam Neuro:alert and oriented X3  Labs: CBC    Component Value Date/Time   WBC 11.9* 01/04/2014 0443   RBC 3.26* 01/04/2014 0443   HGB 9.5* 01/04/2014 0443   HCT 29.8* 01/04/2014 0443   PLT 203 01/04/2014 0443   MCV 91.4 01/04/2014 0443   MCH 29.1 01/04/2014 0443   MCHC 31.9 01/04/2014 0443   RDW 14.7 01/04/2014 0443   LYMPHSABS 3.4 01/04/2014 0443   MONOABS 0.9 01/04/2014 0443   EOSABS 0.1  01/04/2014 0443   BASOSABS 0.0 01/04/2014 0443    BMET    Component Value Date/Time   NA 141 01/04/2014 0443   K 5.0 01/04/2014 0443   CL 105 01/04/2014 0443   CO2 22 01/04/2014 0443   GLUCOSE 106* 01/04/2014 0443   BUN 21 01/04/2014 0443   CREATININE 1.20* 01/04/2014 0443   CREATININE 1.38* 10/30/2013 0849   CALCIUM 9.3 01/04/2014 0443   GFRNONAA 41* 01/04/2014 0443   GFRNONAA 36* 10/30/2013 0849   GFRAA 48* 01/04/2014 0443   GFRAA 41* 10/30/2013 0849    CMP     Component Value Date/Time   NA 141 01/04/2014 0443   K 5.0 01/04/2014 0443   CL 105 01/04/2014 0443   CO2 22 01/04/2014 0443   GLUCOSE 106* 01/04/2014 0443   BUN 21 01/04/2014 0443   CREATININE 1.20* 01/04/2014 0443   CREATININE 1.38* 10/30/2013 0849   CALCIUM 9.3 01/04/2014 0443   PROT 6.7 12/31/2013 1705   ALBUMIN 3.4* 12/31/2013 1705   AST 30 12/31/2013 1705   ALT 30 12/31/2013 1705   ALKPHOS 70 12/31/2013 1705   BILITOT 0.3 12/31/2013 1705   GFRNONAA 41* 01/04/2014 0443   GFRNONAA 36* 10/30/2013 0849   GFRAA 48* 01/04/2014 0443  GFRAA 41* 10/30/2013 0849    Assessment and Plan: 1. Code Status:DNR/DNI 2. Symptom Control  3. Pain-- Right back and leg O Oxy codone IR 5 mg every 3 hrs prn  Dexamethasone 4 mg po daily in morning  Lidoderm 5% patch to affect area Bowel Regimen: Colestipol 1 GM two tablets bid  4. Psycho/Social:  Emotional support offered to patient.  She expresses appreciation for the "time to share" 5.  Spiritual  Strong community [employer] support  5. Disposition:  Dependant on outcomes     Time In Time Out Total Time Spent with Patient Total Overall Time  0800 0825 25 min 25 min    Greater than 50%  of this time was spent counseling and coordinating care related to the above assessment and plan.  Wadie Lessen NP  Palliative Medicine Team Team Phone # [phone] Pager [phone]  Discussed with Dr Sheran Fava 1

## 2014-01-04 NOTE — Discharge Summary (Addendum)
Physician Discharge Summary  Amber Knox QQI:[id number] DOB: 1929/10/16 DOA: 12/31/2013  PCP: Ival Bible, MD  Admit date: 12/31/2013 Discharge date: 01/05/2014  Recommendations for Outpatient Follow-up:  1. Followup for PET scan on April 29 at 10:45 AM. 2. Followup with oncology, appointment to be scheduled after her PET scan results are in.  Office to call patient with time and date of appointment.   3. Dr. Marin Olp:  Please arrange for biopsy based on PET scan results. 4. Repeat BMP and CBC in 1 week to check anemia, WBC, and kidney function please.  Blood cultures are pending.   5. Palliative care to follow please 6. Please make sure lovenox injection given evening of 4/24 and then each evening thereafter.  Discharge Diagnoses:  Principal Problem:   Acute massive pulmonary embolism Active Problems:   DM type 2 (diabetes mellitus, type 2)   Shock circulatory   Acute respiratory failure   Lumbar disc disease   Metastasis to adrenal gland of unknown origin   Palliative care encounter   Pain of back and right lower extremity   Metastatic cancer   Discharge Condition: Stable, improved  Diet recommendation: Healthy heart  Wt Readings from Last 3 Encounters:  01/05/14 105.87 kg (233 lb 6.4 oz)  12/28/13 107.701 kg (237 lb 7 oz)  10/03/13 107.049 kg (236 lb)    History of present illness:  Amber Knox is a 80-yo-female with pmh T2DM, HTN, HL, hypothyroidism, OA, osteopenia, lumbar back pain scheduled for right L2-3, right L4-5 microdiscectomy on 4/20 who presented with 1 day history of lightheadedness, weakness, and decreased urine output. Per family pt was feeling her normal self until this morning when she began feeling "drunk" and lightheaded with weakness and fatigue. Pt has been having 3 month history of low back pain and was in rehab facility from March 13-April 9 with immobilization and inability to ambulate with right leg leg pain since being home. Pt was scheduled for  right L2-3, right L4-5 microdiscectomy tomorrow and underwent pre-op evaluation on 4/16 that was normal. She reports no shortness of breath, cough, URI symptoms, fevers, or sick contacts. She also denies chest pain, tachycardia, hemoptysis, or syncope. She has been having decreased appetite for some time without reported weight change. She reports no history of blood clots.   In the ED pt was found to have blood pressure of 75/54 with pO2 of 80% on RA with CT chest w/o contrast findings of massive saddle pulmonary embolus. Pt was given IVF's and supplemental oxygen (4L via NRB) in the ED with improvement of blood pressure and hypoxia.   She was admitted with massive saddle pulmonary embolism and started on IV heparin. CT scan of the chest was consistent with massive saddle embolism extending into the lobar and segmental-sized branches of both lungs. Incidentally, she was found to have osteolytic lesion of the manubrium, a 3.8 cm right adrenal mass, and a 6 mm nodule in the left lower lobe. She met with palliative care and discussed her goals of care. She is DO NOT RESUSCITATE.  She would like to get a diagnosis of her underlying malignancy.   Hospital Course:   Massive to submassive saddle pulmonary embolism with possible mild right heart strain given mild dilation of the right ventricle. She was also found to have right lower extremity distal femoral and popliteal DVTs.  IVC filter was deferred because she had clinical improvement and because her clots in her lower extremities were distal. This was likely provoked  in the setting of immobilization and malignancy.   Echocardiogram demonstrated normal left and right ventricular ejection fractions, mild RV dilation, and normal pulmonary artery pressures. Because it is thought that her underlying etiology may be due to malignancy, she was started on Lovenox injections which she should continue indefinitely. She received teaching on how to do Lovenox injections.  She was warned of the risks of bleeding.  Osteolytic lesion of the manubrium, this could be concerning for metastatic cancer versus multiple myeloma. The case was discussed with Doctor Pascal Lux from interventional radiology however the area was too small for biopsy. He recommended undergoing PET scan to identify active lesions which may be more amenable to biopsy. She may have metastatic areas in the adrenal glands and she has a 6 mm lung nodule.  SPEP suggested a restricted band, but IFE was negative for monoclonal free light chains.  Kappa/lambda ratio was wnl.    Transient hypotension in the setting of massive pulmonary malaise, resolved with IV fluids.  Acute kidney injury, baseline creatinine is approximately 1.3, however creatinine was 2.8 on admission. This is likely prerenal in the setting of hypotension from pulmonary and pleasant. Her creatinine recovered with IV fluids.  Morbid obesity, stable. BMI is 46. Recommend heart healthy diet.  Non-insulin-dependent type 2 diabetes mellitus, last hemoglobin A1c 5.9 on 07/19/13.  Continue home medications.    Hypothyroidism, stable, continued home medication.  Degenerative facet disease in the lower lumbar spine with anterolisthesis at L4-L5. She has chronic severe pain in this area. She was seen by palliative care he recommended continuing oxycodone when necessary pain she is also on gabapentin. Due to her recent acute kidney injury, her NSAID medication have been held.  She will need to defer surgery until at least 1 month after starting anticoagulation.  Gout, stable.  Continue allopurinol  Diabetic peripheral neuropathy, continued gabapentin.  T2DM, last A1c 5.9. CBG well controlled despite holding diabetes medications.   Stop diabetes medications and repeat A1c in 3 months.     LINES / TUBES:  PIV 4/9 >>  Foley Catheter 4/19  CULTURES:  Blood cultures 4/19 >>  ANTIBIOTICS:  Vancomycin 4/19>>4/19  Zosyn 4/19 >>4/19   Consultations: Pulmonary/critical care Palliative care  Discharge Exam: Filed Vitals:   01/05/14 0926  BP: 150/51  Pulse:   Temp:   Resp:    Filed Vitals:   01/05/14 0544 01/05/14 0555 01/05/14 0700 01/05/14 0926  BP: 159/54   150/51  Pulse: 61     Temp: 97.5 F (36.4 C)     TempSrc: Oral     Resp: 18     Height:      Weight:   105.87 kg (233 lb 6.4 oz)   SpO2: 95% 95%     General:  CF, No acute distress  HEENT: NCAT, MMM  Cardiovascular: RRR, nl S1, S2 no mrg, 2+ pulses, warm extremities  Respiratory: CTAB, no increased WOB  Abdomen: NABS, soft, NT/ND  MSK: Normal tone and bulk, no LEE Neuro: diffuse generalized weakness 4/5   Discharge Instructions      Discharge Orders   Future Appointments Provider Department Dept Phone   01/10/2014 11:00 AM [facility] [phone]   Pt should arrive15 minutes prior to scheduled appt time. Please inform patient that exam will take a minimum of 1 1/2 hours. Patient to be NPO 6 hours prior to exam  and should not take any insulin the day of exam.   Future Orders Complete  By Expires   Call MD for:  difficulty breathing, headache or visual disturbances  As directed    Call MD for:  extreme fatigue  As directed    Call MD for:  hives  As directed    Call MD for:  persistant dizziness or light-headedness  As directed    Call MD for:  persistant nausea and vomiting  As directed    Call MD for:  severe uncontrolled pain  As directed    Call MD for:  temperature >100.4  As directed    Diet - low sodium heart healthy  As directed    Driving Restrictions  As directed    Increase activity slowly  As directed    NM PET Image Initial (PI) Skull Base To Thigh  As directed 03/07/2015   Questions:     Preferred imaging location?:  [facility]   Reason for Exam (SYMPTOM  OR DIAGNOSIS REQUIRED):  new diagnosis, metastatic cancer, unknown primary, trying to find area to biopsy        Medication List    STOP taking these medications       aspirin 81 MG tablet     celecoxib 200 MG capsule  Commonly known as:  CELEBREX     colestipol 1 G tablet  Commonly known as:  COLESTID     HYDROcodone-acetaminophen 5-325 MG per tablet  Commonly known as:  NORCO/VICODIN     metFORMIN 1000 MG tablet  Commonly known as:  GLUCOPHAGE     Olmesartan-Amlodipine-HCTZ 40-10-25 MG Tabs  Commonly known as:  TRIBENZOR     saxagliptin HCl 5 MG Tabs tablet  Commonly known as:  ONGLYZA     traMADol 50 MG tablet  Commonly known as:  ULTRAM      TAKE these medications       allopurinol 300 MG tablet  Commonly known as:  ZYLOPRIM  Take 1 tablet (300 mg total) by mouth daily.     amLODipine 10 MG tablet  Commonly known as:  NORVASC  Take 1 tablet (10 mg total) by mouth daily.     dexamethasone 4 MG tablet  Commonly known as:  DECADRON  Take 1 tablet (4 mg total) by mouth daily.     enoxaparin 150 MG/ML injection  Commonly known as:  LOVENOX  Inject 1 mL (150 mg total) into the skin at bedtime.     famotidine 20 MG tablet  Commonly known as:  PEPCID  Take 1 tablet (20 mg total) by mouth daily.     gabapentin 300 MG capsule  Commonly known as:  NEURONTIN  Take 1 capsule (300 mg total) by mouth 2 (two) times daily.     levothyroxine 112 MCG tablet  Commonly known as:  SYNTHROID, LEVOTHROID  Take 1 tablet (112 mcg total) by mouth daily.     lidocaine 5 %  Commonly known as:  LIDODERM  Place 1-3 patches onto the skin daily.     ondansetron 8 MG disintegrating tablet  Commonly known as:  ZOFRAN-ODT  Take 8 mg by mouth every 8 (eight) hours as needed for nausea.     oxyCODONE 5 MG immediate release tablet  Commonly known as:  Oxy IR/ROXICODONE  Take 1 tablet (5 mg total) by mouth every 3 (three) hours as needed for moderate pain.     polyethylene glycol packet  Commonly known as:  MIRALAX / GLYCOLAX  Take 17 g by mouth daily as needed for mild constipation.  promethazine 12.5 MG tablet  Commonly known as:  PHENERGAN  Take 12.5 mg by mouth every 6 (six) hours as needed for nausea.     tiZANidine 4 MG tablet  Commonly known as:  ZANAFLEX  Take 4 mg by mouth every 6 (six) hours as needed for muscle spasms.       Follow-up Information   Follow up with Birdena Jubilee, MD On 01/12/2014. (11:15)    Specialty:  Family Medicine   Contact information:   [address] [place] [address] [phone]       Follow up with [facility] On 01/10/2014. (10:45AM.  No food or sugar for 6 hours prior.  Water only.  No insulin prior to procedure that day.  )       Follow up with Josph Macho, MD In 2 weeks. (will be called by office with time and date of appointment.  )    Specialty:  Oncology   Contact information:   [address] [place] [address] [phone]        The results of significant diagnostics from this hospitalization (including imaging, microbiology, ancillary and laboratory) are listed below for reference.    Significant Diagnostic Studies: Dg Chest 2 View  12/28/2013   CLINICAL DATA:  Pre operative respiratory exam. Lumbar spinal stenosis.  EXAM: CHEST  2 VIEW  COMPARISON:  None.  FINDINGS: The heart size and pulmonary vascularity are normal. No infiltrates or effusions. Tiny linear areas of scarring at the lung bases. Slight thickening of the minor fissure on the right which is most likely chronic.  No acute osseous abnormality. Severe arthritis is present at the right shoulder with loose bodies in the joint.  IMPRESSION: No acute abnormality.   Electronically Signed   By: Geanie Cooley M.D.   On: 12/28/2013 10:03   Ct Chest Wo Contrast  12/31/2013   CLINICAL DATA:  Hypotension. Possible sepsis. Lethargy. Possible right infrahilar mass noted on recent chest x-ray.  EXAM: CT CHEST WITHOUT CONTRAST  TECHNIQUE: Multidetector CT imaging of the chest was performed following the standard  protocol without IV contrast.  COMPARISON:  No priors.  Chest x-ray 12/31/2013.  FINDINGS: Mediastinum: Although today's study is a noncontrast CT examination, when the viewing windows are narrowed, there is clear evidence of a massive saddle embolus extending into lobar and segmental sized pulmonary artery branches to the lungs bilaterally. The apparent hilar enlargement noted on the recent chest radiograph was presumably secondary to this finding (so called "Fleischner sign"). Dilatation of the pulmonic trunk (3.6 cm in diameter, and probable dilatation of the right heart (difficult to assess on today's non contrast CT examination), indicative of pulmonary arterial hypertension and likely elevated right-sided heart pressures. Heart size is borderline enlarged. There is no significant pericardial fluid, thickening or pericardial calcification. There is atherosclerosis of the thoracic aorta, the great vessels of the mediastinum and the coronary arteries, including calcified atherosclerotic plaque in the left main, left anterior descending, left circumflex and right coronary arteries. No pathologically enlarged mediastinal or hilar lymph nodes. Please note that accurate exclusion of hilar adenopathy is limited on noncontrast CT scans.  Lungs/Pleura: No right middle or lower lobe mass identified. No acute consolidative airspace disease to suggest hemorrhage from complete pulmonary infarction at this time. 6 mm nodule in the superior segment of the left lower lobe (image 20 of series 3). Linear areas of mild scarring throughout the lung bases bilaterally.  Upper Abdomen:  Status post cholecystectomy. 3.8 cm right adrenal mass is indeterminate (14 HU). Multiple low-attenuation lesions in the upper pole of the left kidney, largest of which measures 7.7 cm in diameter; these are incompletely characterized on today's non contrast CT examination, but are favored to represent cysts.  Musculoskeletal: Lucent lesion in the  inferior aspect of the manubrium with associated nondisplaced pathologic fracture best appreciated on sagittal reconstructions.  IMPRESSION: 1. Massive saddle embolus extending into the lobar and segmental sized branches of both lungs. There is dilatation of the pulmonic trunk, and probable dilatation of the right heart, indicative of elevated pulmonary artery pressures and likely developing right-sided heart strain. 2. Aggressive appearing lytic lesion with pathologic fracture through the was inferior aspect of the manubrium. Statistically, this is likely to represent a metastatic lesion, and further evaluation to identified potential source of primary malignancy is suggested. 3. 3.8 cm right adrenal mass is indeterminate. While this may simply represent a lipid poor adenoma, but given the appearance of the manubrium, the possibility of a metastatic lesion or primary malignancy is not excluded. 4. 6 mm nodule in the superior segment of the left lower lobe. Attention on followup studies is recommended, as this could represent a metastatic lesion or a primary neoplasm, although this is highly nonspecific at this time. Critical Value/emergent results were called by telephone at the time of interpretation on 12/31/2013 at 6:56 PM to Dr. Benjiman Core , who verbally acknowledged these results.   Electronically Signed   By: Trudie Reed M.D.   On: 12/31/2013 19:08   US Renal Port  01/02/2014   CLINICAL DATA:  Elevated creatinine  EXAM: RENAL/URINARY TRACT ULTRASOUND COMPLETE  COMPARISON:  None.  FINDINGS: Right Kidney:  Length: 11.9 cm in length. No hydronephrosis. Normal cortical echogenicity. Multiple benign appearing cysts. No mass. Mild cortical thinning.  Left Kidney:  Length: 13.1 cm in length. Normal cortical echogenicity. No hydronephrosis or mass. Benign appearing cysts. The largest is 6.9 cm in the upper pole.  Bladder:  Foley catheter decompresses the bladder.  IMPRESSION: No evidence of  hydronephrosis.  Benign cysts.   Electronically Signed   By: Maryclare Bean M.D.   On: 01/02/2014 00:58   Dg Chest Port 1 View  01/01/2014   CLINICAL DATA:  Pulmonary embolism.  EXAM: PORTABLE CHEST - 1 VIEW  COMPARISON:  CT CHEST W/O CM dated 12/31/2013; DG CHEST 1V PORT dated 12/31/2013  FINDINGS: Mediastinum is normal. Stable right pulmonary arterial prominence is again noted as noted on recent chest CT in this patient with severe pulmonary emboli. Stable cardiomegaly and pulmonary venous structures present. Left lung base subsegmental atelectasis. No pleural effusion or pneumothorax. No acute osseous abnormality.  IMPRESSION: 1. Stable cardiomegaly. 2. Left base subsegmental atelectasis.   Electronically Signed   By: Maisie Fus  Register   On: 01/01/2014 08:03   Dg Chest Port 1 View  12/31/2013   CLINICAL DATA:  Hypoxia.  Fatigue.  EXAM: PORTABLE CHEST - 1 VIEW  COMPARISON:  Chest x-ray 12/28/2013.  FINDINGS: There is a masslike appearance of the right infrahilar region. Lungs otherwise appear clear. No pleural effusions. No evidence of pulmonary edema. Mild cardiomegaly discretion borderline cardiomegaly, likely accentuated by portable AP technique. Atherosclerosis in the thoracic aorta.  IMPRESSION: 1. Masslike appearance of the right infrahilar region. The possibility of underlying pulmonary mass and/or right sided lymphadenopathy is not excluded, and correlation with contrast enhanced chest CT is suggested at this time. Given the patient's history of hypoxia, PE protocol CT  scan would be optimal to evaluate the anatomy associated with this finding, as well as to exclude underlying pulmonary embolism if clinically appropriate. 2. Atherosclerosis. These results were called by telephone at the time of interpretation on 12/31/2013 at 6:02 PM to Dr. Benjiman Core, who verbally acknowledged these results.   Electronically Signed   By: Trudie Reed M.D.   On: 12/31/2013 18:04    Microbiology: Recent Results  (from the past 240 hour(s))  SURGICAL PCR SCREEN     Status: None   Collection Time    12/28/13  9:41 AM      Result Value Ref Range Status   MRSA, PCR NEGATIVE  NEGATIVE Final   Staphylococcus aureus NEGATIVE  NEGATIVE Final   Comment:            The Xpert SA Assay (FDA     approved for NASAL specimens     in patients over 67 years of age),     is one component of     a comprehensive surveillance     program.  Test performance has     been validated by [employer] for patients greater     than or equal to 37 year old.     It is not intended     to diagnose infection nor to     guide or monitor treatment.  CULTURE, BLOOD (ROUTINE X 2)     Status: None   Collection Time    12/31/13  5:05 PM      Result Value Ref Range Status   Specimen Description BLOOD RIGHT ARM   Final   Special Requests BOTTLES DRAWN AEROBIC AND ANAEROBIC 10 CC   Final   Culture  Setup Time     Final   Value: 12/31/2013 21:24     Performed at [facility]   Culture     Final   Value:        BLOOD CULTURE RECEIVED NO GROWTH TO DATE CULTURE WILL BE HELD FOR 5 DAYS BEFORE ISSUING A FINAL NEGATIVE REPORT     Performed at [facility]   Report Status PENDING   Incomplete  CULTURE, BLOOD (ROUTINE X 2)     Status: None   Collection Time    12/31/13  5:15 PM      Result Value Ref Range Status   Specimen Description BLOOD LEFT HAND   Final   Special Requests BOTTLES DRAWN AEROBIC AND ANAEROBIC 5 CC   Final   Culture  Setup Time     Final   Value: 12/31/2013 21:24     Performed at [facility]   Culture     Final   Value:        BLOOD CULTURE RECEIVED NO GROWTH TO DATE CULTURE WILL BE HELD FOR 5 DAYS BEFORE ISSUING A FINAL NEGATIVE REPORT     Performed at [facility]   Report Status PENDING   Incomplete  URINE CULTURE     Status: None   Collection Time    12/31/13  5:39 PM      Result Value Ref Range Status   Specimen Description URINE, CATHETERIZED   Final   Special  Requests NONE   Final   Culture  Setup Time     Final   Value: 01/01/2014 03:13     Performed at [facility] Count     Final   Value: NO GROWTH  Performed at [facility]     Final   Value: NO GROWTH     Performed at [facility]   Report Status 01/02/2014 FINAL   Final  MRSA PCR SCREENING     Status: None   Collection Time    12/31/13  8:47 PM      Result Value Ref Range Status   MRSA by PCR NEGATIVE  NEGATIVE Final   Comment:            The GeneXpert MRSA Assay (FDA     approved for NASAL specimens     only), is one component of a     comprehensive MRSA colonization     surveillance program. It is not     intended to diagnose MRSA     infection nor to guide or     monitor treatment for     MRSA infections.     Labs: Basic Metabolic Panel:  Recent Labs Lab 01/01/14 0220 01/01/14 1045 01/02/14 0246 01/03/14 0258 01/04/14 0443  NA 134* 139 144 140 141  K 5.5* 5.5* 5.2 5.0 5.0  CL 103 106 111 104 105  CO2 17* _0 GLUCOSE 93 93 93 147* 106*  BUN 38* 34* 24* 20 21  CREATININE 2.20* 2.01* 1.37* 1.40* 1.20*  CALCIUM 7.7* 8.4 8.5 8.9 9.3  MG  --   --  1.7 2.0 1.8  PHOS  --   --  3.3 2.7 3.7   Liver Function Tests:  Recent Labs Lab 12/31/13 1705  AST 30  ALT 30  ALKPHOS 70  BILITOT 0.3  PROT 6.7  ALBUMIN 3.4*   No results found for this basename: LIPASE, AMYLASE,  in the last 168 hours No results found for this basename: AMMONIA,  in the last 168 hours CBC:  Recent Labs Lab 12/31/13 1705  01/01/14 0220 01/02/14 0246 01/03/14 0258 01/04/14 0443 01/05/14 0543  WBC 17.1*  --  14.1* 10.5 9.7 11.9* 10.7*  NEUTROABS 13.9*  --   --  6.3 7.2 7.5 6.2  HGB 11.4*  < > 9.4* 9.5* 9.3* 9.5* 9.7*  HCT 36.5  < > 29.9* 30.9* 29.0* 29.8* 30.8*  MCV 93.6  --  92.9 93.6 91.8 91.4 91.1  PLT 206  --  174 173 188 203 226  < > = values in this interval not displayed. Cardiac Enzymes:  Recent Labs Lab  12/31/13 2254 01/01/14 0210 01/01/14 1045  TROPONINI 0.39* 0.60* 0.41*   BNP: BNP (last 3 results)  Recent Labs  01/02/14 0246  PROBNP 4037.0*   CBG:  Recent Labs Lab 01/01/14 1618 01/01/14 1949 01/01/14 2339 01/02/14 0340 01/02/14 0735  GLUCAP 125* 135* 93 103* 118*    Time coordinating discharge: 45 minutes  Signed:  Janece Canterbury  [facility] 01/05/2014, 1:35 PM

## 2014-01-04 NOTE — Progress Notes (Signed)
[facility] PROGRESS NOTE  Amber Knox OHY:[id number] DOB: [birth year] DOA: 12/31/2013 PCP: Ival Bible, MD  Assessment/Plan  Massive to submassive saddle pulmonary embolism with possible mild right heart strain given mild dilation of the right ventricle. She was also found to have right lower extremity distal femoral and popliteal DVTs. IVC filter was deferred because she had clinical improvement and because her clots in her lower extremities were distal. This was likely provoked in the setting of immobilization and malignancy. Echocardiogram demonstrated normal left and right ventricular ejection fractions, mild RV dilation, and normal pulmonary artery pressures. Because it is thought that her underlying etiology may be due to malignancy, she was started on Lovenox injections which she should continue indefinitely.  -  Teaching on how to do Lovenox injections.  -  She was warned of the risks of bleeding.  -  Wean oxygen as tolerated  Osteolytic lesion of the manubrium, this could be concerning for metastatic cancer versus multiple myeloma. The case was discussed with Doctor Pascal Lux from interventional radiology however the area was too small for biopsy. He recommended undergoing PET scan to identify active lesions which may be more amenable to biopsy. She may have metastatic areas in the adrenal glands and she has a 6 mm lung nodule.  -  F/u SPEP/UPEP -  PET scan scheduled for next week -  Dr. Marin Olp to follow up on PET scan result  Transient hypotension in the setting of massive pulmonary malaise, resolved with IV fluids.  Now hypertensive -  Restart norvasc 69m daily (home med)  Acute kidney injury, baseline creatinine is approximately 1.3, however creatinine was 2.8 on admission. This is likely prerenal in the setting of hypotension from pulmonary and pleasant. Her creatinine recovered with IV fluids.   Morbid obesity, stable. BMI is 46. Recommend heart healthy diet.    Non-insulin-dependent type 2 diabetes mellitus, last hemoglobin A1c 5.9 on 07/19/13.  -  Continue SSI  Hypothyroidism, stable, continued home medication.   Degenerative facet disease in the lower lumbar spine with anterolisthesis at L4-L5. She has chronic severe pain in this area. She was seen by palliative care he recommended continuing oxycodone when necessary pain she is also on gabapentin. Due to her recent acute kidney injury, her NSAID medication have been held. She will need to defer surgery until at least 1 month after starting anticoagulation.  -  Continue oxy -  Continue dexamethasone -  Continue lidocaine patch  Gout, stable. Continue allopurinol   Diabetic peripheral neuropathy,  -  Restart gabapentin, renally dosed  Chronic diarrhea, now constipated due to narcotics -  D/c colestipol -  Start prn stool softener/stimulant  Diet:  Healthy heart Access:  PIV IVF:  off Proph:  lovenox  Code Status: DNR Family Communication: patient and her daughter who is a nMarine scientistDisposition Plan:  Pending PT evaluation, likely to SNF for rehab.  SW consult placed   LINES / TUBES:  PIV 4/9 >>  Foley Catheter 4/19 >> continue for comfort for now CULTURES:  Blood cultures 4/19 >>  ANTIBIOTICS:  Vancomycin 4/19>>4/19  Zosyn 4/19 >>4/19  Consultations:  Pulmonary/critical care  Palliative care   HPI/Subjective:  Feeling better.  Mild SOB.  Pain improved after pain medication a little while ago.    Objective: Filed Vitals:   01/03/14 2148 01/04/14 0130 01/04/14 0701 01/04/14 1041  BP: 154/54 161/56 148/66 148/57  Pulse: 58 56 61 67  Temp: 98.4 F (36.9 C) 98.2 F (36.8 C) 98 F (36.7  C)   TempSrc: Oral Oral Oral   Resp: _0 Height:      Weight:   107.593 kg (237 lb 3.2 oz)   SpO2: 98% 99% 97%     Intake/Output Summary (Last 24 hours) at 01/04/14 1105 Last data filed at 01/04/14 0845  Gross per 24 hour  Intake    873 ml  Output   3275 ml  Net  -2402 ml    Filed Weights   01/02/14 0500 01/03/14 0600 01/04/14 0701  Weight: 114 kg (251 lb 5.2 oz) 113.5 kg (250 lb 3.6 oz) 107.593 kg (237 lb 3.2 oz)    Exam:   General:  No acute distress  HEENT:  NCAT, MMM  Cardiovascular:  RRR, nl S1, S2 no mrg, 2+ pulses, warm extremities  Respiratory:  CTAB, no increased WOB  Abdomen:   NABS, soft, NT/ND  MSK:   Normal tone and bulk, no LEE  Neuro:  Grossly intact  Data Reviewed: Basic Metabolic Panel:  Recent Labs Lab 01/01/14 0220 01/01/14 1045 01/02/14 0246 01/03/14 0258 01/04/14 0443  NA 134* 139 144 140 141  K 5.5* 5.5* 5.2 5.0 5.0  CL 103 106 111 104 105  CO2 17* _1 GLUCOSE 93 93 93 147* 106*  BUN 38* 34* 24* 20 21  CREATININE 2.20* 2.01* 1.37* 1.40* 1.20*  CALCIUM 7.7* 8.4 8.5 8.9 9.3  MG  --   --  1.7 2.0 1.8  PHOS  --   --  3.3 2.7 3.7   Liver Function Tests:  Recent Labs Lab 12/31/13 1705  AST 30  ALT 30  ALKPHOS 70  BILITOT 0.3  PROT 6.7  ALBUMIN 3.4*   No results found for this basename: LIPASE, AMYLASE,  in the last 168 hours No results found for this basename: AMMONIA,  in the last 168 hours CBC:  Recent Labs Lab 12/31/13 1705 12/31/13 1729 01/01/14 0220 01/02/14 0246 01/03/14 0258 01/04/14 0443  WBC 17.1*  --  14.1* 10.5 9.7 11.9*  NEUTROABS 13.9*  --   --  6.3 7.2 7.5  HGB 11.4* 13.3 9.4* 9.5* 9.3* 9.5*  HCT 36.5 39.0 29.9* 30.9* 29.0* 29.8*  MCV 93.6  --  92.9 93.6 91.8 91.4  PLT 206  --  174 173 188 203   Cardiac Enzymes:  Recent Labs Lab 12/31/13 2254 01/01/14 0210 01/01/14 1045  TROPONINI 0.39* 0.60* 0.41*   BNP (last 3 results)  Recent Labs  01/02/14 0246  PROBNP 4037.0*   CBG:  Recent Labs Lab 01/01/14 1618 01/01/14 1949 01/01/14 2339 01/02/14 0340 01/02/14 0735  GLUCAP 125* 135* 93 103* 118*    Recent Results (from the past 240 hour(s))  SURGICAL PCR SCREEN     Status: None   Collection Time    12/28/13  9:41 AM      Result Value Ref Range  Status   MRSA, PCR NEGATIVE  NEGATIVE Final   Staphylococcus aureus NEGATIVE  NEGATIVE Final   Comment:            The Xpert SA Assay (FDA     approved for NASAL specimens     in patients over 61 years of age),     is one component of     a comprehensive surveillance     program.  Test performance has     been validated by [employer] for patients greater     than or equal  to 31 year old.     It is not intended     to diagnose infection nor to     guide or monitor treatment.  CULTURE, BLOOD (ROUTINE X 2)     Status: None   Collection Time    12/31/13  5:05 PM      Result Value Ref Range Status   Specimen Description BLOOD RIGHT ARM   Final   Special Requests BOTTLES DRAWN AEROBIC AND ANAEROBIC 10 CC   Final   Culture  Setup Time     Final   Value: 12/31/2013 21:24     Performed at [facility]   Culture     Final   Value:        BLOOD CULTURE RECEIVED NO GROWTH TO DATE CULTURE WILL BE HELD FOR 5 DAYS BEFORE ISSUING A FINAL NEGATIVE REPORT     Performed at [facility]   Report Status PENDING   Incomplete  CULTURE, BLOOD (ROUTINE X 2)     Status: None   Collection Time    12/31/13  5:15 PM      Result Value Ref Range Status   Specimen Description BLOOD LEFT HAND   Final   Special Requests BOTTLES DRAWN AEROBIC AND ANAEROBIC 5 CC   Final   Culture  Setup Time     Final   Value: 12/31/2013 21:24     Performed at [facility]   Culture     Final   Value:        BLOOD CULTURE RECEIVED NO GROWTH TO DATE CULTURE WILL BE HELD FOR 5 DAYS BEFORE ISSUING A FINAL NEGATIVE REPORT     Performed at [facility]   Report Status PENDING   Incomplete  URINE CULTURE     Status: None   Collection Time    12/31/13  5:39 PM      Result Value Ref Range Status   Specimen Description URINE, CATHETERIZED   Final   Special Requests NONE   Final   Culture  Setup Time     Final   Value: 01/01/2014 03:13     Performed at [facility]      Final   Value: NO GROWTH     Performed at [facility]   Culture     Final   Value: NO GROWTH     Performed at [facility]   Report Status 01/02/2014 FINAL   Final  MRSA PCR SCREENING     Status: None   Collection Time    12/31/13  8:47 PM      Result Value Ref Range Status   MRSA by PCR NEGATIVE  NEGATIVE Final   Comment:            The GeneXpert MRSA Assay (FDA     approved for NASAL specimens     only), is one component of a     comprehensive MRSA colonization     surveillance program. It is not     intended to diagnose MRSA     infection nor to guide or     monitor treatment for     MRSA infections.     Studies: No results found.  Scheduled Meds: . allopurinol  300 mg Oral Daily  . colestipol  2 g Oral BID  . dexamethasone  4 mg Oral Daily  . enoxaparin (LOVENOX) injection  105 mg Subcutaneous Q12H  . famotidine  20 mg Oral Daily  . gabapentin  300 mg Oral TID  . levothyroxine  112 mcg Oral QAC breakfast  . lidocaine  1 patch Transdermal Q24H   Continuous Infusions: . sodium chloride 10 mL/hr at 01/03/14 1058    Principal Problem:   Acute massive pulmonary embolism Active Problems:   DM type 2 (diabetes mellitus, type 2)   Shock circulatory   Acute respiratory failure   Lumbar disc disease   Metastasis to adrenal gland of unknown origin   Palliative care encounter   Pain of back and right lower extremity    Time spent: 30 min    [facility] Pager [phone]. If 7PM-7AM, please contact night-coverage at [email or web address], password [facility] 01/04/2014, 11:05 AM  LOS: 4 days

## 2014-01-04 NOTE — Progress Notes (Signed)
Utilization Review Completed Ingri Diemer J. Joletta Manner, RN, BSN, NCM [phone]  

## 2014-01-04 NOTE — Progress Notes (Signed)
OT Cancellation Note  Patient Details Name: MARKIA KYER MRN: [id number] DOB: 01/15/1930   Cancelled Treatment:    Reason Eval/Treat Not Completed: Other (comment) Pt's current D/C plan is SNF. No apparent immediate acute care OT needs, therefore will defer OT to SNF. If OT eval is needed please call Acute Rehab Dept. at [phone] or text page OT at [phone].    Almon Register [phone]

## 2014-01-05 DIAGNOSIS — C799 Secondary malignant neoplasm of unspecified site: Secondary | ICD-10-CM

## 2014-01-05 DIAGNOSIS — C801 Malignant (primary) neoplasm, unspecified: Secondary | ICD-10-CM

## 2014-01-05 LAB — CBC WITH DIFFERENTIAL/PLATELET
Basophils Absolute: 0 10*3/uL (ref 0.0–0.1)
Basophils Relative: 0 % (ref 0–1)
Eosinophils Absolute: 0.1 10*3/uL (ref 0.0–0.7)
Eosinophils Relative: 1 % (ref 0–5)
HEMATOCRIT: 30.8 % — AB (ref 36.0–46.0)
HEMOGLOBIN: 9.7 g/dL — AB (ref 12.0–15.0)
LYMPHS ABS: 3.4 10*3/uL (ref 0.7–4.0)
LYMPHS PCT: 32 % (ref 12–46)
MCH: 28.7 pg (ref 26.0–34.0)
MCHC: 31.5 g/dL (ref 30.0–36.0)
MCV: 91.1 fL (ref 78.0–100.0)
Monocytes Absolute: 1 10*3/uL (ref 0.1–1.0)
Monocytes Relative: 9 % (ref 3–12)
Neutro Abs: 6.2 10*3/uL (ref 1.7–7.7)
Neutrophils Relative %: 58 % (ref 43–77)
Platelets: 226 10*3/uL (ref 150–400)
RBC: 3.38 MIL/uL — AB (ref 3.87–5.11)
RDW: 14.7 % (ref 11.5–15.5)
WBC: 10.7 10*3/uL — AB (ref 4.0–10.5)

## 2014-01-05 MED ORDER — OXYCODONE HCL 5 MG PO TABS: 5.0000 mg | ORAL_TABLET | ORAL | Status: DC | PRN

## 2014-01-05 MED ORDER — AMLODIPINE BESYLATE 10 MG PO TABS: 10.0000 mg | ORAL_TABLET | Freq: Every day | ORAL | Status: DC

## 2014-01-05 MED ORDER — POLYETHYLENE GLYCOL 3350 17 G PO PACK: 17.0000 g | PACK | Freq: Every day | ORAL | Status: DC | PRN

## 2014-01-05 MED ORDER — ENOXAPARIN SODIUM 150 MG/ML ~~LOC~~ SOLN: 150.0000 mg | Freq: Every day | SUBCUTANEOUS | Status: DC

## 2014-01-05 MED ORDER — GABAPENTIN 300 MG PO CAPS: 300.0000 mg | ORAL_CAPSULE | Freq: Two times a day (BID) | ORAL | Status: DC

## 2014-01-05 MED ORDER — FAMOTIDINE 20 MG PO TABS: 20.0000 mg | ORAL_TABLET | Freq: Every day | ORAL | Status: DC

## 2014-01-05 MED ORDER — LEVOTHYROXINE SODIUM 112 MCG PO TABS
112.0000 ug | ORAL_TABLET | Freq: Every day | ORAL | Status: DC
  Administered 2014-01-05: 112 ug via ORAL
  Filled 2014-01-05 (×2): qty 1

## 2014-01-05 MED ORDER — DEXAMETHASONE 4 MG PO TABS: 4.0000 mg | ORAL_TABLET | Freq: Every day | ORAL | Status: DC

## 2014-01-05 NOTE — Plan of Care (Signed)
Problem: Discharge Progression Outcomes Goal: Independent ADLs or Home Health Care Outcome: Not Met (add Reason) [place]

## 2014-01-06 LAB — CULTURE, BLOOD (ROUTINE X 2)
CULTURE: NO GROWTH
Culture: NO GROWTH

## 2014-01-08 ENCOUNTER — Telehealth: Payer: Self-pay | Admitting: Internal Medicine

## 2014-01-08 DIAGNOSIS — C799 Secondary malignant neoplasm of unspecified site: Secondary | ICD-10-CM

## 2014-01-08 NOTE — Telephone Encounter (Signed)
Asked by staff in radiology to please place additional PET scan order after patient discharged because due to a computer glitch, outpatient PET scans get canceled when a patient is discharged from the hospital.

## 2014-01-09 NOTE — Clinical Social Work Psychosocial (Addendum)
Clinical Social Work Department BRIEF PSYCHOSOCIAL ASSESSMENT 01/04/2014  Patient:  Amber Knox, Amber Knox     Account Number:  [id number]     Admit date:  12/31/2013  Clinical Social Worker:  Iona Coach  Date/Time:  01/04/2014 02:55 PM  Referred by:  Physician  Date Referred:  01/04/2014 Referred for  SNF Placement   Other Referral:   Interview type:  Other - See comment Other interview type:   Patient and daughter    PSYCHOSOCIAL DATA Living Status:  FAMILY Admitted from facility:   Level of care:   Primary support name:  Luis Abed  (c) [phone] Primary support relationship to patient:  CHILD, ADULT Degree of support available:   Strong support    CURRENT CONCERNS Current Concerns  Post-Acute Placement   Other Concerns:    SOCIAL WORK ASSESSMENT / PLAN CSW met with patient and her daughter [name] today to discussed PT's recomentation for SNF level of care. Patient had been living at home and daughter reports that she could no longer manage at home due to her increasing medical problems.  CSW discussed SNF bed search and daughter is requesting placement at [facility].  She stated that this facility came highly recommended by several people.  CSW contacted admissions at [facility] and she stated that they had a vacancy and would review patient's information.  Fl2 completed and sent to facility for review.  Bed offer received and accepted by patient and her daughter.  Per MD- anticipate d/c tomorrow.  Fl2 placed on chart for MD's signature.   Assessment/plan status:  Psychosocial Support/Ongoing Assessment of Needs Other assessment/ plan:   Information/referral to community resources:   SNF bed list offered but patient/daughter had specific SNF facility request    PATIENT'S/FAMILY'S RESPONSE TO PLAN OF CARE: Patient is alert, oriented and extremely pleasant.  Patient stated that sheis agreeable to snf placement and feels encouraged that daughter has located a  facility that comes recommmended by friends.  She exibited a very postiive attitude JI:RCVE for placement and states that she has strong family support. Her daughter is very supportive and invovled in her mother's care and states that she hopes that a bed can be located at [facility].

## 2014-01-09 NOTE — Clinical Social Work Placement (Addendum)
Clinical Social Work Department CLINICAL SOCIAL WORK PLACEMENT NOTE 01/05/2014  Patient:  Amber Knox, Amber Knox  Account Number:  [id number] Admit date:  12/31/2013  Clinical Social Worker:  Butch Penny Kalyn Hofstra, LCSWA  Date/time:  01/04/2014 02:45 PM  Clinical Social Work is seeking post-discharge placement for this patient at the following level of care:   SKILLED NURSING   (*CSW will update this form in Epic as items are completed)   01/04/2014  Patient/family provided with [facility] [place] Department of Clinical Social Work's list of facilities offering this level of care within the geographic area requested by the patient (or if unable, by the patient's family).  01/04/2014  Patient/family informed of their freedom to choose among providers that offer the needed level of care, that participate in Medicare, Medicaid or managed care program needed by the patient, have an available bed and are willing to accept the patient.  01/04/2014  Patient/family informed of [facility]' ownership interest in [facility], as well as of the fact that they are under no obligation to receive care at this facility.  PASARR submitted to EDS on 01/04/2014 PASARR number received from EDS on 01/04/2014  FL2 transmitted to all facilities in geographic area requested by pt/family on  01/04/2014 FL2 transmitted to all facilities within larger geographic area on   Patient informed that his/her managed care company has contracts with or will negotiate with  certain facilities, including the following:   [facility]     Patient/family informed of bed offers received:  01/05/2014 Patient chooses bed at [facility] Physician recommends and patient chooses bed at    Patient to be transferred to [place] on  01/05/2014 Patient to be transferred to facility by Ambulance Corey Harold)  The following physician request were entered in Epic:   Additional Comments: 01/05/14  Ok per MD for d/c  today. Bed available at facility of choice per patent and daughter.  CSW met with them prior to d/c. Patient veralize to CSW how grateful she is for her supportive family and feels that she has lived a long and full life. She feels that this will be a long term placement due to her medical conditions. Patient spoke about her loving family and of the support that they have given her as her health has deteriorated.  Patient was at times very emotional and tearful but was able to relate extremely positive attitude about her life's acheivements, her love of her family and her stong faith in GOD.  CSW provided support and encouraged patient to talk about her feelings and emotions. Patient's daughter was also noted to be very emotional as she listened to her mother talk about her  life and love of her family. They were both very happy about the facility of choice coming available and were appreciative of CSW's assistance. Nursing notified and called report to facility. CSW signing off.  Lorie Phenix. [place], [place]

## 2014-01-10 ENCOUNTER — Encounter (HOSPITAL_COMMUNITY): Payer: Self-pay

## 2014-01-10 ENCOUNTER — Ambulatory Visit (HOSPITAL_COMMUNITY): Payer: Medicare Other

## 2014-01-10 ENCOUNTER — Ambulatory Visit (HOSPITAL_COMMUNITY)
Admit: 2014-01-10 | Discharge: 2014-01-10 | Disposition: A | Discharge status: Home / Self Care | Payer: Medicare Other | Source: Ambulatory Visit | Attending: Internal Medicine | Admitting: Internal Medicine

## 2014-01-10 DIAGNOSIS — E039 Hypothyroidism, unspecified: Secondary | ICD-10-CM

## 2014-01-10 DIAGNOSIS — R269 Unspecified abnormalities of gait and mobility: Secondary | ICD-10-CM

## 2014-01-10 DIAGNOSIS — K449 Diaphragmatic hernia without obstruction or gangrene: Secondary | ICD-10-CM

## 2014-01-10 DIAGNOSIS — E875 Hyperkalemia: Secondary | ICD-10-CM

## 2014-01-10 DIAGNOSIS — M519 Unspecified thoracic, thoracolumbar and lumbosacral intervertebral disc disorder: Secondary | ICD-10-CM

## 2014-01-10 DIAGNOSIS — Z515 Encounter for palliative care: Secondary | ICD-10-CM

## 2014-01-10 DIAGNOSIS — N281 Cyst of kidney, acquired: Secondary | ICD-10-CM

## 2014-01-10 DIAGNOSIS — Z8739 Personal history of other diseases of the musculoskeletal system and connective tissue: Secondary | ICD-10-CM

## 2014-01-10 DIAGNOSIS — S72412A Displaced unspecified condyle fracture of lower end of left femur, initial encounter for closed fracture: Secondary | ICD-10-CM

## 2014-01-10 DIAGNOSIS — R579 Shock, unspecified: Secondary | ICD-10-CM

## 2014-01-10 DIAGNOSIS — K219 Gastro-esophageal reflux disease without esophagitis: Secondary | ICD-10-CM

## 2014-01-10 DIAGNOSIS — E279 Disorder of adrenal gland, unspecified: Secondary | ICD-10-CM | POA: Insufficient documentation

## 2014-01-10 DIAGNOSIS — M25569 Pain in unspecified knee: Secondary | ICD-10-CM

## 2014-01-10 DIAGNOSIS — J9600 Acute respiratory failure, unspecified whether with hypoxia or hypercapnia: Secondary | ICD-10-CM

## 2014-01-10 DIAGNOSIS — I10 Essential (primary) hypertension: Secondary | ICD-10-CM

## 2014-01-10 DIAGNOSIS — C7970 Secondary malignant neoplasm of unspecified adrenal gland: Secondary | ICD-10-CM

## 2014-01-10 DIAGNOSIS — R110 Nausea: Secondary | ICD-10-CM

## 2014-01-10 DIAGNOSIS — G629 Polyneuropathy, unspecified: Secondary | ICD-10-CM

## 2014-01-10 DIAGNOSIS — E785 Hyperlipidemia, unspecified: Secondary | ICD-10-CM

## 2014-01-10 DIAGNOSIS — M549 Dorsalgia, unspecified: Secondary | ICD-10-CM

## 2014-01-10 DIAGNOSIS — K635 Polyp of colon: Secondary | ICD-10-CM

## 2014-01-10 DIAGNOSIS — I2699 Other pulmonary embolism without acute cor pulmonale: Secondary | ICD-10-CM

## 2014-01-10 DIAGNOSIS — M8580 Other specified disorders of bone density and structure, unspecified site: Secondary | ICD-10-CM

## 2014-01-10 DIAGNOSIS — N19 Unspecified kidney failure: Secondary | ICD-10-CM

## 2014-01-10 DIAGNOSIS — D649 Anemia, unspecified: Secondary | ICD-10-CM

## 2014-01-10 DIAGNOSIS — E119 Type 2 diabetes mellitus without complications: Secondary | ICD-10-CM

## 2014-01-10 DIAGNOSIS — K5790 Diverticulosis of intestine, part unspecified, without perforation or abscess without bleeding: Secondary | ICD-10-CM

## 2014-01-10 DIAGNOSIS — C801 Malignant (primary) neoplasm, unspecified: Secondary | ICD-10-CM | POA: Insufficient documentation

## 2014-01-10 DIAGNOSIS — M79604 Pain in right leg: Secondary | ICD-10-CM

## 2014-01-10 LAB — GLUCOSE, CAPILLARY: GLUCOSE-CAPILLARY: 106 mg/dL — AB (ref 70–99)

## 2014-01-10 MED ORDER — FLUDEOXYGLUCOSE F - 18 (FDG) INJECTION
11.9000 | Freq: Once | INTRAVENOUS | Status: AC | PRN
  Administered 2014-01-10: 11.9 via INTRAVENOUS

## 2014-01-12 ENCOUNTER — Ambulatory Visit: Payer: Medicare Other | Admitting: Family Medicine

## 2014-01-16 ENCOUNTER — Encounter: Payer: Self-pay | Admitting: Hematology & Oncology

## 2014-01-16 ENCOUNTER — Ambulatory Visit: Payer: Medicare Other

## 2014-01-16 ENCOUNTER — Ambulatory Visit (HOSPITAL_BASED_OUTPATIENT_CLINIC_OR_DEPARTMENT_OTHER): Payer: Medicare Other | Admitting: Lab

## 2014-01-16 ENCOUNTER — Ambulatory Visit (HOSPITAL_BASED_OUTPATIENT_CLINIC_OR_DEPARTMENT_OTHER): Payer: Medicare Other | Admitting: Hematology & Oncology

## 2014-01-16 VITALS — BP 141/68 | HR 71 | Temp 99.1°F | Resp 12 | Ht 60.0 in | Wt 234.0 lb

## 2014-01-16 DIAGNOSIS — C799 Secondary malignant neoplasm of unspecified site: Secondary | ICD-10-CM

## 2014-01-16 DIAGNOSIS — I2699 Other pulmonary embolism without acute cor pulmonale: Secondary | ICD-10-CM

## 2014-01-16 DIAGNOSIS — I82409 Acute embolism and thrombosis of unspecified deep veins of unspecified lower extremity: Secondary | ICD-10-CM

## 2014-01-16 DIAGNOSIS — M899 Disorder of bone, unspecified: Secondary | ICD-10-CM

## 2014-01-16 DIAGNOSIS — R29898 Other symptoms and signs involving the musculoskeletal system: Secondary | ICD-10-CM

## 2014-01-16 LAB — CBC WITH DIFFERENTIAL (CANCER CENTER ONLY)
BASO#: 0 10*3/uL (ref 0.0–0.2)
BASO%: 0.2 % (ref 0.0–2.0)
EOS%: 0.3 % (ref 0.0–7.0)
Eosinophils Absolute: 0.1 10*3/uL (ref 0.0–0.5)
HEMATOCRIT: 36.5 % (ref 34.8–46.6)
HEMOGLOBIN: 11.9 g/dL (ref 11.6–15.9)
LYMPH#: 2.5 10*3/uL (ref 0.9–3.3)
LYMPH%: 12.6 % — AB (ref 14.0–48.0)
MCH: 29.7 pg (ref 26.0–34.0)
MCHC: 32.6 g/dL (ref 32.0–36.0)
MCV: 91 fL (ref 81–101)
MONO#: 1.2 10*3/uL — AB (ref 0.1–0.9)
MONO%: 6 % (ref 0.0–13.0)
NEUT#: 16.2 10*3/uL — ABNORMAL HIGH (ref 1.5–6.5)
NEUT%: 80.9 % — ABNORMAL HIGH (ref 39.6–80.0)
Platelets: 324 10*3/uL (ref 145–400)
RBC: 4.01 10*6/uL (ref 3.70–5.32)
RDW: 14.5 % (ref 11.1–15.7)
WBC: 20.1 10*3/uL — ABNORMAL HIGH (ref 3.9–10.0)

## 2014-01-16 LAB — CMP (CANCER CENTER ONLY)
ALBUMIN: 3.5 g/dL (ref 3.3–5.5)
ALT(SGPT): 22 U/L (ref 10–47)
AST: 24 U/L (ref 11–38)
Alkaline Phosphatase: 55 U/L (ref 26–84)
BUN: 35 mg/dL — AB (ref 7–22)
CO2: 29 mEq/L (ref 18–33)
Calcium: 9.1 mg/dL (ref 8.0–10.3)
Chloride: 101 mEq/L (ref 98–108)
Creat: 1.3 mg/dl — ABNORMAL HIGH (ref 0.6–1.2)
GLUCOSE: 180 mg/dL — AB (ref 73–118)
Potassium: 4 mEq/L (ref 3.3–4.7)
Sodium: 141 mEq/L (ref 128–145)
Total Bilirubin: 0.7 mg/dl (ref 0.20–1.60)
Total Protein: 7 g/dL (ref 6.4–8.1)

## 2014-01-16 LAB — TECHNOLOGIST REVIEW CHCC SATELLITE

## 2014-01-16 NOTE — Progress Notes (Signed)
Referral MD  Reason for Referral: Pulmonary embolism and lower extremity DVT   Possible lytic lesion and sternum  Chief Complaint  Patient presents with  . Why is right leg so weak; Wants results of PET scan  : I may have cancer  HPI:  [name] is an   80 year old white female. She certainly looks younger. She has multiple medical issues. However, she's been pretty healthy.  She was supposed to have back surgery  for a herniated disc at L4-5. This causes severe radicular pain in the right leg. She had MRI at [facility].  She did have some falling episodes earlier this year. [place] She had no syncope. She just had a little bit of unsteadiness. There is no bowel or bladder incontinence.  She subsequently was hospitalized with a pulmonary embolism and DVT. This is in April. Her surgery was subsequently canceled. She was placed on anticoagulation. She currently is on Lovenox.  With the workup that was done in the hospital, there was a right adrenal lesion. There is a questionable lung lesion. She was told that "you have metastatic cancer". This is incredibly callous of whoever said this. We do not have a tissue diagnosis at all right now.  He had a PET scan done. The PET scan only showed some uptake in the sternum. There is a compression fracture L2 that is possibly pathologic. The adrenal lesion appears to be an adenoma. No other uptake is noted.  She is at a rehabilitation center. She is on Lovenox. She is on OxyContin for pain.  She was told to come see [place] for [place] to take care of her issues.  She is incredibly nice. She comes in with her family. They are wonderful and really are involved.  [name] has had her mammograms routinely. She does not have a uterus or ovaries. She's never smoked. She has no occupational exposures. She is a Education officer, museum for 30 years.  In the hospital, she had a urine test. I am not sure that this was a 24-hour are not. There was no  Bence-Jones protein. A SPEP was done which was negative. She has mild renal insufficiency. She has long-standing diabetes. Her last mammogram was done in February of 2014 and was normal. She had ultrasound of the kidneys. She has cysts with the kidneys.  An ultrasound of the legs showed a thrombus in the right leg. This extended from the posterior tibial vein to the distal femoral vein. On her CT scan, showed massive saddle embolism. There was a "aggressive-appearing" lytic lesion in the inferior aspect of the manubrium. There is a 6 mm left lung nodule.  There is currently some weakness in the right leg.    Past Medical History  Diagnosis Date  . DM type 2 (diabetes mellitus, type 2)   . Hyperlipemia   . Hypertension   . Osteopenia     Lumbar spine  . Hx of osteoarthritis     bilateral knee osteoarthritis  -   bilateral knee replacement  . Colon polyps     Tubular Adenoma  . Diverticulosis     patient unaware   . Family history of anesthesia complication     Daughter, Son and Yolanda Bonine - slow to awaken  . Peripheral neuropathy   . Hiatal hernia   . Heart murmur     no need to follow  . GERD (gastroesophageal reflux disease)     not on meds, tums prn  .  Arthritis   . Hypothyroidism   :  Past Surgical History  Procedure Laterality Date  . Abdominal hysterectomy      excessive bleeding (IUD)  . Cataract extraction Right   . Cholecystectomy    . Joint replacement    . Cardiac catheterization  1995    [facility] - clean  . Eye surgery Right     Cataract  . Knee arthroplasty Bilateral   . Tonsillectomy    . Carpal tunnel release Right   :  Current outpatient prescriptions:allopurinol (ZYLOPRIM) 300 MG tablet, Take 1 tablet (300 mg total) by mouth daily., Disp: 90 tablet, Rfl: 3;  amLODipine (NORVASC) 10 MG tablet, Take 1 tablet (10 mg total) by mouth daily., Disp: , Rfl: ;  dexamethasone (DECADRON) 4 MG tablet, Take 1 tablet (4 mg total) by mouth daily., Disp: , Rfl:  enoxaparin  (LOVENOX) 150 MG/ML injection, Inject 1 mL (150 mg total) into the skin at bedtime., Disp: 0 Syringe, Rfl: ;  famotidine (PEPCID) 20 MG tablet, Take 1 tablet (20 mg total) by mouth daily., Disp: 30 tablet, Rfl: ;  gabapentin (NEURONTIN) 300 MG capsule, Take 1 capsule (300 mg total) by mouth 2 (two) times daily., Disp: 60 capsule, Rfl: 0 levothyroxine (SYNTHROID, LEVOTHROID) 112 MCG tablet, Take 1 tablet (112 mcg total) by mouth daily., Disp: 90 tablet, Rfl: 3;  lidocaine (LIDODERM) 5 %, Place 1-3 patches onto the skin daily., Disp: , Rfl: ;  ondansetron (ZOFRAN-ODT) 8 MG disintegrating tablet, Take 8 mg by mouth every 8 (eight) hours as needed for nausea. , Disp: , Rfl:  oxyCODONE (OXY IR/ROXICODONE) 5 MG immediate release tablet, Take 1 tablet (5 mg total) by mouth every 3 (three) hours as needed for moderate pain., Disp: 100 tablet, Rfl: 0;  polyethylene glycol (MIRALAX / GLYCOLAX) packet, Take 17 g by mouth daily as needed for mild constipation., Disp: 14 each, Rfl: 0;  promethazine (PHENERGAN) 12.5 MG tablet, Take 12.5 mg by mouth every 6 (six) hours as needed for nausea. , Disp: , Rfl:  tiZANidine (ZANAFLEX) 4 MG tablet, Take 4 mg by mouth every 6 (six) hours as needed for muscle spasms., Disp: , Rfl: :  :  Allergies  Allergen Reactions  . Ace Inhibitors Cough  . Byetta 10 Mcg Pen [Exenatide] Other (See Comments)    Pancreatitis 02/2005  . Beta Adrenergic Blockers Swelling    Tongue swelling   :  Family History  Problem Relation Age of Onset  . Vascular Disease Father     Cardio  . Diabetes type II Other   . Diabetes type II Other   . Breast cancer Paternal Aunt   :  History   Social History  . Marital Status: Widowed    Spouse Name: N/A    Number of Children: N/A  . Years of Education: N/A   Occupational History  . Not on file.   Social History Main Topics  . Smoking status: Never Smoker   . Smokeless tobacco: Never Used  . Alcohol Use: No  . Drug Use: No  . Sexual  Activity: Not Currently   Other Topics Concern  . 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  Exam: @IPVITALS @  somewhat obese white female. Her vital signs show a temperature of 99.1 pulse 71. Blood pressure 140/60. Weight 234 pounds. There's no adenopathy noted. Lungs are clear. Cardiac exam regular rate rhythm. There is no tenderness over her breast bone. Abdomen is soft obese. She does note palpable liver or spleen. Axillary exam shows no bilateral axillary adenopathy. Back exam no tenderness over the spine ribs or hips. Extremities shows no venous cord in the legs. Shows no swelling in the legs. There is decent range of motion. She does have surgical scars. Neurological exam shows some slight weakness in the right leg. Skin exam no suspicious skin lesions.    Recent Labs  01/16/14 1110  WBC 20.1*  HGB 11.9  HCT 36.5  PLT 324    Recent Labs  01/16/14 1110  NA 141  K 4.0  CL 101  CO2 29  GLUCOSE 180*  BUN 35*  CREATININE 1.3*  CALCIUM 9.1    Blood sme Pathology:none    Assessment: 80 year old with multiple medical problems. She had pulmonary embolism and at least right lower extremity DVT. She is on Lovenox right now.  I did request is whether or not she has malignancy. Again, no tissue diagnosis has been made. I am just shocked that she was not given her biopsy while in the hospital. This would be a whole lot easier at this was done in the hospital. Again it just puzzles me as to why she was told that she had cancer but  yet no one did a biopsy on her.  We will have to get a tissue diagnosis. I cannot see any obvious risk factor for malignancy with her.  It appears that the sternum might be the site of biopsy.  We need an MRI of the back. This will be done of the lumbosacral spine. Her last was done February. This showed a compression fracture at L2. So it would appear that this might be a chronic issue.  She ultimately will need to have surgery for the lumbar spine at L4-5.  I will did everything set up. I put in a call to interventional radiology. Am sure they will look at the scans.  I spent over an hour with her and her family. They're all very nice. I gave Ms. Ewing a prayer blanket.  We will get her back once we have definitive proof of malignancy.

## 2014-01-18 ENCOUNTER — Other Ambulatory Visit: Payer: Self-pay | Admitting: Radiology

## 2014-01-19 ENCOUNTER — Encounter (HOSPITAL_COMMUNITY): Payer: Self-pay

## 2014-01-20 LAB — IGG, IGA, IGM
IgA: 112 mg/dL (ref 69–380)
IgG (Immunoglobin G), Serum: 558 mg/dL — ABNORMAL LOW (ref 690–1700)
IgM, Serum: 26 mg/dL — ABNORMAL LOW (ref 52–322)

## 2014-01-20 LAB — PROTEIN ELECTROPHORESIS, SERUM, WITH REFLEX
ALBUMIN ELP: 56.3 % (ref 55.8–66.1)
ALPHA-2-GLOBULIN: 17.2 % — AB (ref 7.1–11.8)
Alpha-1-Globulin: 5.8 % — ABNORMAL HIGH (ref 2.9–4.9)
BETA 2: 4.4 % (ref 3.2–6.5)
BETA GLOBULIN: 7.4 % — AB (ref 4.7–7.2)
Gamma Globulin: 8.9 % — ABNORMAL LOW (ref 11.1–18.8)
Total Protein, Serum Electrophoresis: 6.2 g/dL (ref 6.0–8.3)

## 2014-01-20 LAB — CEA: CEA: 3.1 ng/mL (ref 0.0–5.0)

## 2014-01-20 LAB — PREALBUMIN: Prealbumin: 20 mg/dL (ref 17.0–34.0)

## 2014-01-20 LAB — IFE INTERPRETATION

## 2014-01-20 LAB — LACTATE DEHYDROGENASE: LDH: 174 U/L (ref 94–250)

## 2014-01-23 ENCOUNTER — Encounter (HOSPITAL_COMMUNITY): Payer: Self-pay

## 2014-01-23 ENCOUNTER — Encounter: Payer: Self-pay | Admitting: Family Medicine

## 2014-01-23 ENCOUNTER — Ambulatory Visit (HOSPITAL_COMMUNITY)
Admission: RE | Admit: 2014-01-23 | Discharge: 2014-01-23 | Disposition: A | Discharge status: Home / Self Care | Payer: Medicare Other | Source: Ambulatory Visit | Attending: Hematology & Oncology | Admitting: Hematology & Oncology

## 2014-01-23 ENCOUNTER — Other Ambulatory Visit: Payer: Self-pay | Admitting: Hematology & Oncology

## 2014-01-23 DIAGNOSIS — M899 Disorder of bone, unspecified: Secondary | ICD-10-CM | POA: Insufficient documentation

## 2014-01-23 DIAGNOSIS — M949 Disorder of cartilage, unspecified: Secondary | ICD-10-CM

## 2014-01-23 DIAGNOSIS — S2220XA Unspecified fracture of sternum, initial encounter for closed fracture: Secondary | ICD-10-CM | POA: Insufficient documentation

## 2014-01-23 DIAGNOSIS — X58XXXA Exposure to other specified factors, initial encounter: Secondary | ICD-10-CM | POA: Insufficient documentation

## 2014-01-23 LAB — CBC
HEMATOCRIT: 35.5 % — AB (ref 36.0–46.0)
HEMOGLOBIN: 11.5 g/dL — AB (ref 12.0–15.0)
MCH: 28.9 pg (ref 26.0–34.0)
MCHC: 32.4 g/dL (ref 30.0–36.0)
MCV: 89.2 fL (ref 78.0–100.0)
Platelets: 242 10*3/uL (ref 150–400)
RBC: 3.98 MIL/uL (ref 3.87–5.11)
RDW: 14.8 % (ref 11.5–15.5)
WBC: 14.2 10*3/uL — AB (ref 4.0–10.5)

## 2014-01-23 LAB — GLUCOSE, CAPILLARY: Glucose-Capillary: 119 mg/dL — ABNORMAL HIGH (ref 70–99)

## 2014-01-23 LAB — PROTIME-INR
INR: 1.04 (ref 0.00–1.49)
Prothrombin Time: 13.4 seconds (ref 11.6–15.2)

## 2014-01-23 LAB — APTT: APTT: 22 s — AB (ref 24–37)

## 2014-01-23 MED ORDER — FENTANYL CITRATE 0.05 MG/ML IJ SOLN
INTRAMUSCULAR | Status: AC
  Filled 2014-01-23: qty 4

## 2014-01-23 MED ORDER — MIDAZOLAM HCL 2 MG/2ML IJ SOLN
INTRAMUSCULAR | Status: AC
  Filled 2014-01-23: qty 2

## 2014-01-23 MED ORDER — SODIUM CHLORIDE 0.9 % IV SOLN
Freq: Once | INTRAVENOUS | Status: AC
  Administered 2014-01-23: 09:00:00 via INTRAVENOUS

## 2014-01-23 MED ORDER — LIDOCAINE HCL 1 % IJ SOLN
INTRAMUSCULAR | Status: AC
  Filled 2014-01-23: qty 10

## 2014-01-23 NOTE — H&P (Signed)
Chief Complaint: "I am here for a biopsy." Referring Physician: Dr. Marin Olp HPI: Amber Knox is an 80 y.o. female who presents today for an image guided biopsy of manubrium lytic lesion s/p PET 01/10/14 which reveals hypermetabolism with no found primary. She recently was diagnosed with RLE DVT and saddle PE and is on lovenox which she held last evening. She denies any chest pain, shortness of breath or palpitations. She denies any active signs of bleeding or excessive bruising. She denies any recent fever or chills. The patient denies any history of sleep apnea or chronic oxygen use. She has previously tolerated sedation without complications during a colonoscopy.    Past Medical History:  Past Medical History  Diagnosis Date  . DM type 2 (diabetes mellitus, type 2)   . Hyperlipemia   . Hypertension   . Osteopenia     Lumbar spine  . Hx of osteoarthritis     bilateral knee osteoarthritis  -   bilateral knee replacement  . Colon polyps     Tubular Adenoma  . Diverticulosis     patient unaware   . Family history of anesthesia complication     Daughter, Son and Yolanda Bonine - slow to awaken  . Peripheral neuropathy   . Hiatal hernia   . Heart murmur     no need to follow  . GERD (gastroesophageal reflux disease)     not on meds, tums prn  . Arthritis   . Hypothyroidism     Past Surgical History:  Past Surgical History  Procedure Laterality Date  . Abdominal hysterectomy      excessive bleeding (IUD)  . Cataract extraction Right   . Cholecystectomy    . Joint replacement    . Cardiac catheterization  1995    [facility] - clean  . Eye surgery Right     Cataract  . Knee arthroplasty Bilateral   . Tonsillectomy    . Carpal tunnel release Right     Family History:  Family History  Problem Relation Age of Onset  . Vascular Disease Father     Cardio  . Diabetes type II Other   . Diabetes type II Other   . Breast cancer Paternal Aunt     Social History:  reports that she has  never smoked. She has never used smokeless tobacco. She reports that she does not drink alcohol or use illicit drugs.  Allergies:  Allergies  Allergen Reactions  . Ace Inhibitors Cough  . Byetta 10 Mcg Pen [Exenatide] Other (See Comments)    Pancreatitis 02/2005  . Beta Adrenergic Blockers Swelling    Tongue swelling     Medications:   Medication List    ASK your doctor about these medications       acetaminophen 325 MG tablet  Commonly known as:  TYLENOL  Take 650 mg by mouth every 4 (four) hours as needed for moderate pain or fever.     allopurinol 300 MG tablet  Commonly known as:  ZYLOPRIM  Take 1 tablet (300 mg total) by mouth daily.     amLODipine 10 MG tablet  Commonly known as:  NORVASC  Take 1 tablet (10 mg total) by mouth daily.     bisacodyl 10 MG suppository  Commonly known as:  DULCOLAX  Place 10 mg rectally daily as needed for moderate constipation.     dexamethasone 4 MG tablet  Commonly known as:  DECADRON  Take 1 tablet (4 mg total) by mouth daily.  enoxaparin 150 MG/ML injection  Commonly known as:  LOVENOX  Inject 1 mL (150 mg total) into the skin at bedtime.     famotidine 20 MG tablet  Commonly known as:  PEPCID  Take 1 tablet (20 mg total) by mouth daily.     gabapentin 300 MG capsule  Commonly known as:  NEURONTIN  Take 1 capsule (300 mg total) by mouth 2 (two) times daily.     HEMOCYTE PLUS 106-1 MG Caps  Take 1 capsule by mouth daily.     levothyroxine 112 MCG tablet  Commonly known as:  SYNTHROID, LEVOTHROID  Take 1 tablet (112 mcg total) by mouth daily.     lidocaine 5 %  Commonly known as:  LIDODERM  Place 1 patch onto the skin daily. Apply to back     magnesium hydroxide 400 MG/5ML suspension  Commonly known as:  MILK OF MAGNESIA  Take 30 mLs by mouth daily as needed for mild constipation.     ondansetron 8 MG disintegrating tablet  Commonly known as:  ZOFRAN-ODT  Take 8 mg by mouth every 8 (eight) hours as needed for  nausea.     oxyCODONE 5 MG immediate release tablet  Commonly known as:  Oxy IR/ROXICODONE  Take 1 tablet (5 mg total) by mouth every 3 (three) hours as needed for moderate pain.     polyethylene glycol packet  Commonly known as:  MIRALAX / GLYCOLAX  Take 17 g by mouth daily as needed for mild constipation.     promethazine 12.5 MG tablet  Commonly known as:  PHENERGAN  Take 12.5 mg by mouth every 6 (six) hours as needed for nausea.     sodium phosphate enema  Commonly known as:  FLEET  Place 1 enema rectally daily as needed (constipation if no result from dulcolax). follow package directions     tiZANidine 4 MG tablet  Commonly known as:  ZANAFLEX  Take 4 mg by mouth every 6 (six) hours as needed for muscle spasms.       Please HPI for pertinent positives, otherwise complete 10 system ROS negative.  Physical Exam: BP 139/51  Pulse 62  Temp(Src) 97.6 F (36.4 C) (Oral)  Resp 18  Ht 5' (1.524 m)  Wt 233 lb (105.688 kg)  BMI 45.50 kg/m2  SpO2 98% Body mass index is 45.5 kg/(m^2).  General Appearance:  Alert, cooperative, no distress  Head:  Normocephalic, without obvious abnormality, atraumatic  Neck: Supple, symmetrical, trachea midline  Lungs:   Clear to auscultation bilaterally, no w/r/r, respirations unlabored without use of accessory muscles.  Chest Wall:  No tenderness or deformity  Heart:  Regular rate and rhythm, S1, S2 normal, no murmur, rub or gallop.  Abdomen:   Soft, non-tender, non distended, (+) BS  Pulses: 1+ and symmetric  Neurologic: Normal affect, no gross deficits.   Results for orders placed during the hospital encounter of 01/23/14 (from the past 48 hour(s))  GLUCOSE, CAPILLARY     Status: Abnormal   Collection Time    01/23/14  7:52 AM      Result Value Ref Range   Glucose-Capillary 119 (*) 70 - 99 mg/dL   No results found.  Assessment/Plan Manubrium lytic lesion, seen on CXR/CT 12/31/13 ordered for hypoxia  Saddle PE 12/31/13 on lovenox,  held. Lytic lesion hypermetabolic on PET 2/70/35 Request for image guided biopsy of manubrium lytic lesion with moderate sedation.  Patient has been NPO, lovenox held, labs reviewed. Risks and Benefits discussed  with the patient. All of the patient's questions were answered, patient is agreeable to proceed. Consent signed and in chart.   Hedy Jacob PA-C 01/23/2014, 8:10 AM

## 2014-01-24 ENCOUNTER — Other Ambulatory Visit: Payer: Self-pay | Admitting: Hematology & Oncology

## 2014-01-24 ENCOUNTER — Ambulatory Visit (HOSPITAL_COMMUNITY)
Admission: RE | Admit: 2014-01-24 | Discharge: 2014-01-24 | Disposition: A | Discharge status: Home / Self Care | Payer: Medicare Other | Source: Ambulatory Visit | Attending: Hematology & Oncology | Admitting: Hematology & Oncology

## 2014-01-24 DIAGNOSIS — M8448XA Pathological fracture, other site, initial encounter for fracture: Secondary | ICD-10-CM | POA: Insufficient documentation

## 2014-01-24 DIAGNOSIS — M48061 Spinal stenosis, lumbar region without neurogenic claudication: Secondary | ICD-10-CM | POA: Insufficient documentation

## 2014-01-24 DIAGNOSIS — R29898 Other symptoms and signs involving the musculoskeletal system: Secondary | ICD-10-CM

## 2014-01-24 DIAGNOSIS — D1809 Hemangioma of other sites: Secondary | ICD-10-CM | POA: Insufficient documentation

## 2014-01-24 DIAGNOSIS — C799 Secondary malignant neoplasm of unspecified site: Secondary | ICD-10-CM

## 2014-01-24 DIAGNOSIS — M549 Dorsalgia, unspecified: Secondary | ICD-10-CM | POA: Insufficient documentation

## 2014-01-24 DIAGNOSIS — M47817 Spondylosis without myelopathy or radiculopathy, lumbosacral region: Secondary | ICD-10-CM | POA: Insufficient documentation

## 2014-01-24 DIAGNOSIS — C801 Malignant (primary) neoplasm, unspecified: Secondary | ICD-10-CM | POA: Insufficient documentation

## 2014-01-24 MED ORDER — GADOBENATE DIMEGLUMINE 529 MG/ML IV SOLN
10.0000 mL | Freq: Once | INTRAVENOUS | Status: AC | PRN
  Administered 2014-01-24: 10 mL via INTRAVENOUS

## 2014-01-31 ENCOUNTER — Other Ambulatory Visit: Payer: Self-pay | Admitting: Neurosurgery

## 2014-02-02 ENCOUNTER — Encounter (HOSPITAL_COMMUNITY): Payer: Self-pay | Admitting: Pharmacy Technician

## 2014-02-08 ENCOUNTER — Encounter (HOSPITAL_COMMUNITY)
Admission: RE | Admit: 2014-02-08 | Discharge: 2014-02-08 | Disposition: A | Discharge status: Home / Self Care | Payer: Medicare Other | Source: Ambulatory Visit | Attending: Neurosurgery | Admitting: Neurosurgery

## 2014-02-08 ENCOUNTER — Encounter (HOSPITAL_COMMUNITY): Payer: Self-pay

## 2014-02-08 DIAGNOSIS — Z01812 Encounter for preprocedural laboratory examination: Secondary | ICD-10-CM | POA: Insufficient documentation

## 2014-02-08 HISTORY — DX: Acute embolism and thrombosis of unspecified deep veins of unspecified lower extremity: I82.409

## 2014-02-08 HISTORY — DX: Gout, unspecified: M10.9

## 2014-02-08 HISTORY — DX: Other pulmonary embolism without acute cor pulmonale: I26.99

## 2014-02-08 LAB — BASIC METABOLIC PANEL
BUN: 43 mg/dL — AB (ref 6–23)
CHLORIDE: 93 meq/L — AB (ref 96–112)
CO2: 27 meq/L (ref 19–32)
Calcium: 9.3 mg/dL (ref 8.4–10.5)
Creatinine, Ser: 1.18 mg/dL — ABNORMAL HIGH (ref 0.50–1.10)
GFR calc Af Amer: 49 mL/min — ABNORMAL LOW (ref >90)
GFR calc non Af Amer: 42 mL/min — ABNORMAL LOW (ref >90)
GLUCOSE: 347 mg/dL — AB (ref 70–99)
POTASSIUM: 5 meq/L (ref 3.7–5.3)
Sodium: 135 mEq/L — ABNORMAL LOW (ref 137–147)

## 2014-02-08 LAB — CBC WITH DIFFERENTIAL/PLATELET
BASOS PCT: 0 % (ref 0–1)
Basophils Absolute: 0 10*3/uL (ref 0.0–0.1)
EOS PCT: 0 % (ref 0–5)
Eosinophils Absolute: 0 10*3/uL (ref 0.0–0.7)
HCT: 38.6 % (ref 36.0–46.0)
HEMOGLOBIN: 12.7 g/dL (ref 12.0–15.0)
Lymphocytes Relative: 10 % — ABNORMAL LOW (ref 12–46)
Lymphs Abs: 1.5 10*3/uL (ref 0.7–4.0)
MCH: 29.5 pg (ref 26.0–34.0)
MCHC: 32.9 g/dL (ref 30.0–36.0)
MCV: 89.8 fL (ref 78.0–100.0)
MONO ABS: 0.3 10*3/uL (ref 0.1–1.0)
Monocytes Relative: 2 % — ABNORMAL LOW (ref 3–12)
Neutro Abs: 13.3 10*3/uL — ABNORMAL HIGH (ref 1.7–7.7)
Neutrophils Relative %: 88 % — ABNORMAL HIGH (ref 43–77)
Platelets: 246 10*3/uL (ref 150–400)
RBC: 4.3 MIL/uL (ref 3.87–5.11)
RDW: 15 % (ref 11.5–15.5)
WBC: 15.1 10*3/uL — ABNORMAL HIGH (ref 4.0–10.5)

## 2014-02-08 LAB — SURGICAL PCR SCREEN
MRSA, PCR: NEGATIVE
Staphylococcus aureus: NEGATIVE

## 2014-02-08 NOTE — Pre-Procedure Instructions (Signed)
Amber Knox  02/08/2014   Your procedure is scheduled on:  Friday, June 5th  Report to [facility] Admitting at 0730 AM.  Call this number if you have problems the morning of surgery: [phone]   Remember:   Do not eat food or drink liquids after midnight.   Take these medicines the morning of surgery with A SIP OF WATER: norvasc, pepcid, neurontin, synthroid, decadron, oxycodone if needed, zofran if needed  Do NOT take any diabetes medication on morning of surgery.  Xarelto per your physician instructions   Do not wear jewelry, make-up or nail polish.  Do not wear lotions, powders, or perfumes. You may wear deodorant.  Do not shave 48 hours prior to surgery. Men may shave face and neck.  Do not bring valuables to the hospital.  [facility] is not responsible   for any belongings or valuables.               Contacts, dentures or bridgework may not be worn into surgery.  Leave suitcase in the car. After surgery it may be brought to your room.  For patients admitted to the hospital, discharge time is determined by your treatment team.               Patients discharged the day of surgery will not be allowed to drive home.  Please read over the following fact sheets that you were given: Pain Booklet, Coughing and Deep Breathing, MRSA Information and Surgical Site Infection Prevention [place] - Preparing for Surgery  Before surgery, you can play an important role.  Because skin is not sterile, your skin needs to be as free of germs as possible.  You can reduce the number of germs on you skin by washing with CHG (chlorahexidine gluconate) soap before surgery.  CHG is an antiseptic cleaner which kills germs and bonds with the skin to continue killing germs even after washing.  Please DO NOT use if you have an allergy to CHG or antibacterial soaps.  If your skin becomes reddened/irritated stop using the CHG and inform your nurse when you arrive at Short Stay.  Do not  shave (including legs and underarms) for at least 48 hours prior to the first CHG shower.  You may shave your face.  Please follow these instructions carefully:   1.  Shower with CHG Soap the night before surgery and the morning of Surgery.  2.  If you choose to wash your hair, wash your hair first as usual with your normal shampoo.  3.  After you shampoo, rinse your hair and body thoroughly to remove the shampoo.  4.  Use CHG as you would any other liquid soap.  You can apply CHG directly to the skin and wash gently with scrungie or a clean washcloth.  5.  Apply the CHG Soap to your body ONLY FROM THE NECK DOWN.  Do not use on open wounds or open sores.  Avoid contact with your eyes, ears, mouth and genitals (private parts).  Wash genitals (private parts) with your normal soap.  6.  Wash thoroughly, paying special attention to the area where your surgery will be performed.  7.  Thoroughly rinse your body with warm water from the neck down.  8.  DO NOT shower/wash with your normal soap after using and rinsing off the CHG Soap.  9.  Pat yourself dry with a clean towel.            10.  Wear clean pajamas.            11.  Place clean sheets on your bed the night of your first shower and do not sleep with pets.  Day of Surgery  Do not apply any lotions/deoderants the morning of surgery.  Please wear clean clothes to the hospital/surgery center.

## 2014-02-08 NOTE — Progress Notes (Signed)
Pt reports that she was told to stop Xarelto 3 days prior to surgery by Dr. Annette Stable and may go back on Lovenox after surgery.

## 2014-02-09 ENCOUNTER — Other Ambulatory Visit (HOSPITAL_COMMUNITY): Payer: Medicare Other

## 2014-02-16 ENCOUNTER — Inpatient Hospital Stay (HOSPITAL_COMMUNITY): Payer: Medicare Other | Admitting: Certified Registered"

## 2014-02-16 ENCOUNTER — Encounter (HOSPITAL_COMMUNITY): Payer: Medicare Other | Admitting: Certified Registered"

## 2014-02-16 ENCOUNTER — Inpatient Hospital Stay (HOSPITAL_COMMUNITY): Payer: Medicare Other

## 2014-02-16 ENCOUNTER — Encounter (HOSPITAL_COMMUNITY)
Admission: RE | Disposition: A | Discharge status: Skilled Nursing Facility | Payer: Self-pay | Source: Ambulatory Visit | Attending: Neurosurgery

## 2014-02-16 ENCOUNTER — Inpatient Hospital Stay (HOSPITAL_COMMUNITY)
Admission: RE | Admit: 2014-02-16 | Discharge: 2014-02-19 | DRG: 519 | Disposition: A | Discharge status: Skilled Nursing Facility | Payer: Medicare Other | Source: Ambulatory Visit | Attending: Neurosurgery | Admitting: Neurosurgery

## 2014-02-16 ENCOUNTER — Encounter (HOSPITAL_COMMUNITY): Payer: Self-pay | Admitting: Certified Registered"

## 2014-02-16 DIAGNOSIS — M109 Gout, unspecified: Secondary | ICD-10-CM | POA: Diagnosis present

## 2014-02-16 DIAGNOSIS — M479 Spondylosis, unspecified: Secondary | ICD-10-CM | POA: Diagnosis present

## 2014-02-16 DIAGNOSIS — Z6841 Body Mass Index (BMI) 40.0 and over, adult: Secondary | ICD-10-CM

## 2014-02-16 DIAGNOSIS — Z9849 Cataract extraction status, unspecified eye: Secondary | ICD-10-CM

## 2014-02-16 DIAGNOSIS — K219 Gastro-esophageal reflux disease without esophagitis: Secondary | ICD-10-CM | POA: Diagnosis present

## 2014-02-16 DIAGNOSIS — Z86711 Personal history of pulmonary embolism: Secondary | ICD-10-CM

## 2014-02-16 DIAGNOSIS — E785 Hyperlipidemia, unspecified: Secondary | ICD-10-CM | POA: Diagnosis present

## 2014-02-16 DIAGNOSIS — I10 Essential (primary) hypertension: Secondary | ICD-10-CM | POA: Diagnosis present

## 2014-02-16 DIAGNOSIS — M5126 Other intervertebral disc displacement, lumbar region: Principal | ICD-10-CM | POA: Diagnosis present

## 2014-02-16 DIAGNOSIS — E119 Type 2 diabetes mellitus without complications: Secondary | ICD-10-CM | POA: Diagnosis present

## 2014-02-16 DIAGNOSIS — Z79899 Other long term (current) drug therapy: Secondary | ICD-10-CM

## 2014-02-16 DIAGNOSIS — Z96659 Presence of unspecified artificial knee joint: Secondary | ICD-10-CM

## 2014-02-16 DIAGNOSIS — G9741 Accidental puncture or laceration of dura during a procedure: Secondary | ICD-10-CM | POA: Diagnosis not present

## 2014-02-16 DIAGNOSIS — M5116 Intervertebral disc disorders with radiculopathy, lumbar region: Secondary | ICD-10-CM | POA: Diagnosis present

## 2014-02-16 DIAGNOSIS — Z86718 Personal history of other venous thrombosis and embolism: Secondary | ICD-10-CM

## 2014-02-16 DIAGNOSIS — G609 Hereditary and idiopathic neuropathy, unspecified: Secondary | ICD-10-CM | POA: Diagnosis present

## 2014-02-16 DIAGNOSIS — R5381 Other malaise: Secondary | ICD-10-CM | POA: Diagnosis present

## 2014-02-16 DIAGNOSIS — IMO0002 Reserved for concepts with insufficient information to code with codable children: Secondary | ICD-10-CM | POA: Diagnosis not present

## 2014-02-16 HISTORY — PX: LUMBAR LAMINECTOMY/DECOMPRESSION MICRODISCECTOMY: SHX5026

## 2014-02-16 LAB — GLUCOSE, CAPILLARY
GLUCOSE-CAPILLARY: 185 mg/dL — AB (ref 70–99)
Glucose-Capillary: 206 mg/dL — ABNORMAL HIGH (ref 70–99)
Glucose-Capillary: 342 mg/dL — ABNORMAL HIGH (ref 70–99)

## 2014-02-16 LAB — PROTIME-INR
INR: 1.09 (ref 0.00–1.49)
Prothrombin Time: 13.9 seconds (ref 11.6–15.2)

## 2014-02-16 LAB — APTT: APTT: 20 s — AB (ref 24–37)

## 2014-02-16 SURGERY — LUMBAR LAMINECTOMY/DECOMPRESSION MICRODISCECTOMY 2 LEVELS
Anesthesia: General | Site: Back

## 2014-02-16 MED ORDER — ARTIFICIAL TEARS OP OINT
TOPICAL_OINTMENT | OPHTHALMIC | Status: DC | PRN
  Administered 2014-02-16: 1 via OPHTHALMIC

## 2014-02-16 MED ORDER — HYDROMORPHONE HCL PF 1 MG/ML IJ SOLN
INTRAMUSCULAR | Status: AC
  Filled 2014-02-16: qty 1

## 2014-02-16 MED ORDER — HEMOCYTE PLUS 106-1 MG PO CAPS: 1.0000 | ORAL_CAPSULE | Freq: Every day | ORAL | Status: DC

## 2014-02-16 MED ORDER — SODIUM CHLORIDE 0.9 % IJ SOLN
3.0000 mL | Freq: Two times a day (BID) | INTRAMUSCULAR | Status: DC
  Administered 2014-02-16 – 2014-02-18 (×3): 3 mL via INTRAVENOUS

## 2014-02-16 MED ORDER — KETOROLAC TROMETHAMINE 30 MG/ML IJ SOLN
INTRAMUSCULAR | Status: AC
  Filled 2014-02-16: qty 1

## 2014-02-16 MED ORDER — ALLOPURINOL 300 MG PO TABS
300.0000 mg | ORAL_TABLET | Freq: Every day | ORAL | Status: DC
  Administered 2014-02-16 – 2014-02-19 (×4): 300 mg via ORAL
  Filled 2014-02-16 (×4): qty 1

## 2014-02-16 MED ORDER — LIRAGLUTIDE 18 MG/3ML ~~LOC~~ SOPN
0.6000 mg | PEN_INJECTOR | Freq: Every morning | SUBCUTANEOUS | Status: DC
  Filled 2014-02-16: qty 3

## 2014-02-16 MED ORDER — ROCURONIUM BROMIDE 50 MG/5ML IV SOLN
INTRAVENOUS | Status: AC
  Filled 2014-02-16: qty 1

## 2014-02-16 MED ORDER — MAGNESIUM HYDROXIDE 400 MG/5ML PO SUSP: 30.0000 mL | Freq: Every day | ORAL | Status: DC | PRN

## 2014-02-16 MED ORDER — DEXAMETHASONE SODIUM PHOSPHATE 10 MG/ML IJ SOLN
10.0000 mg | INTRAMUSCULAR | Status: AC
  Administered 2014-02-16: 10 mg via INTRAVENOUS
  Filled 2014-02-16: qty 1

## 2014-02-16 MED ORDER — CEFAZOLIN SODIUM-DEXTROSE 2-3 GM-% IV SOLR
2.0000 g | INTRAVENOUS | Status: AC
  Administered 2014-02-16: 2 g via INTRAVENOUS
  Filled 2014-02-16: qty 50

## 2014-02-16 MED ORDER — HYDROMORPHONE HCL PF 1 MG/ML IJ SOLN: 0.5000 mg | INTRAMUSCULAR | Status: DC | PRN

## 2014-02-16 MED ORDER — ONDANSETRON 4 MG PO TBDP
8.0000 mg | ORAL_TABLET | Freq: Three times a day (TID) | ORAL | Status: DC | PRN
  Administered 2014-02-18: 8 mg via ORAL
  Filled 2014-02-16: qty 2

## 2014-02-16 MED ORDER — ACETAMINOPHEN 650 MG RE SUPP: 650.0000 mg | RECTAL | Status: DC | PRN

## 2014-02-16 MED ORDER — POLYETHYLENE GLYCOL 3350 17 G PO PACK
17.0000 g | PACK | Freq: Every day | ORAL | Status: DC | PRN
  Filled 2014-02-16: qty 1

## 2014-02-16 MED ORDER — GLYCOPYRROLATE 0.2 MG/ML IJ SOLN
INTRAMUSCULAR | Status: DC | PRN
  Administered 2014-02-16: .8 mg via INTRAVENOUS

## 2014-02-16 MED ORDER — AMLODIPINE BESYLATE 10 MG PO TABS
10.0000 mg | ORAL_TABLET | Freq: Every day | ORAL | Status: DC
  Administered 2014-02-17 – 2014-02-19 (×3): 10 mg via ORAL
  Filled 2014-02-16 (×3): qty 1

## 2014-02-16 MED ORDER — THROMBIN 5000 UNITS EX SOLR
CUTANEOUS | Status: DC | PRN
  Administered 2014-02-16 (×2): 5000 [IU] via TOPICAL

## 2014-02-16 MED ORDER — OXYCODONE HCL 5 MG PO TABS
5.0000 mg | ORAL_TABLET | Freq: Once | ORAL | Status: AC | PRN
  Administered 2014-02-16: 5 mg via ORAL

## 2014-02-16 MED ORDER — ONDANSETRON HCL 4 MG/2ML IJ SOLN
INTRAMUSCULAR | Status: DC | PRN
  Administered 2014-02-16: 4 mg via INTRAVENOUS

## 2014-02-16 MED ORDER — KETOROLAC TROMETHAMINE 30 MG/ML IJ SOLN
15.0000 mg | Freq: Four times a day (QID) | INTRAMUSCULAR | Status: DC
  Administered 2014-02-16 – 2014-02-18 (×8): 15 mg via INTRAVENOUS
  Filled 2014-02-16 (×11): qty 1

## 2014-02-16 MED ORDER — FLEET ENEMA 7-19 GM/118ML RE ENEM: 1.0000 | ENEMA | Freq: Once | RECTAL | Status: AC | PRN

## 2014-02-16 MED ORDER — FAMOTIDINE 20 MG PO TABS
20.0000 mg | ORAL_TABLET | Freq: Every morning | ORAL | Status: DC
  Administered 2014-02-17 – 2014-02-19 (×3): 20 mg via ORAL
  Filled 2014-02-16 (×3): qty 1

## 2014-02-16 MED ORDER — OXYCODONE HCL 5 MG/5ML PO SOLN: 5.0000 mg | Freq: Once | ORAL | Status: AC | PRN

## 2014-02-16 MED ORDER — FE FUMARATE-B12-VIT C-FA-IFC PO CAPS
1.0000 | ORAL_CAPSULE | Freq: Every day | ORAL | Status: DC
  Administered 2014-02-16 – 2014-02-19 (×4): 1 via ORAL
  Filled 2014-02-16 (×4): qty 1

## 2014-02-16 MED ORDER — ROCURONIUM BROMIDE 100 MG/10ML IV SOLN
INTRAVENOUS | Status: DC | PRN
  Administered 2014-02-16: 10 mg via INTRAVENOUS
  Administered 2014-02-16: 50 mg via INTRAVENOUS
  Administered 2014-02-16 (×2): 10 mg via INTRAVENOUS

## 2014-02-16 MED ORDER — FENTANYL CITRATE 0.05 MG/ML IJ SOLN
INTRAMUSCULAR | Status: DC | PRN
  Administered 2014-02-16: 100 ug via INTRAVENOUS
  Administered 2014-02-16 (×2): 50 ug via INTRAVENOUS

## 2014-02-16 MED ORDER — TIZANIDINE HCL 4 MG PO TABS
4.0000 mg | ORAL_TABLET | Freq: Four times a day (QID) | ORAL | Status: DC | PRN
  Filled 2014-02-16: qty 1

## 2014-02-16 MED ORDER — HEMOSTATIC AGENTS (NO CHARGE) OPTIME
TOPICAL | Status: DC | PRN
  Administered 2014-02-16: 1 via TOPICAL

## 2014-02-16 MED ORDER — ACETAMINOPHEN 325 MG PO TABS: 650.0000 mg | ORAL_TABLET | ORAL | Status: DC | PRN

## 2014-02-16 MED ORDER — BISACODYL 10 MG RE SUPP: 10.0000 mg | Freq: Every day | RECTAL | Status: DC | PRN

## 2014-02-16 MED ORDER — NEOSTIGMINE METHYLSULFATE 10 MG/10ML IV SOLN
INTRAVENOUS | Status: DC | PRN
  Administered 2014-02-16: 5 mg via INTRAVENOUS

## 2014-02-16 MED ORDER — SODIUM CHLORIDE 0.9 % IJ SOLN
3.0000 mL | INTRAMUSCULAR | Status: DC | PRN
  Administered 2014-02-16: 3 mL via INTRAVENOUS

## 2014-02-16 MED ORDER — ARTIFICIAL TEARS OP OINT
TOPICAL_OINTMENT | OPHTHALMIC | Status: AC
  Filled 2014-02-16: qty 3.5

## 2014-02-16 MED ORDER — LEVOTHYROXINE SODIUM 112 MCG PO TABS
112.0000 ug | ORAL_TABLET | Freq: Every day | ORAL | Status: DC
  Administered 2014-02-17 – 2014-02-19 (×3): 112 ug via ORAL
  Filled 2014-02-16 (×5): qty 1

## 2014-02-16 MED ORDER — METOCLOPRAMIDE HCL 5 MG/ML IJ SOLN: 10.0000 mg | Freq: Once | INTRAMUSCULAR | Status: DC | PRN

## 2014-02-16 MED ORDER — OXYCODONE HCL 5 MG PO TABS
5.0000 mg | ORAL_TABLET | ORAL | Status: DC | PRN
Start: 2014-02-16 — End: 2014-02-18

## 2014-02-16 MED ORDER — ONDANSETRON HCL 4 MG/2ML IJ SOLN
INTRAMUSCULAR | Status: AC
  Filled 2014-02-16: qty 2

## 2014-02-16 MED ORDER — OXYCODONE HCL 5 MG PO TABS
ORAL_TABLET | ORAL | Status: AC
  Filled 2014-02-16: qty 1

## 2014-02-16 MED ORDER — ALUM & MAG HYDROXIDE-SIMETH 200-200-20 MG/5ML PO SUSP
30.0000 mL | Freq: Four times a day (QID) | ORAL | Status: DC | PRN
  Administered 2014-02-17 – 2014-02-19 (×3): 30 mL via ORAL
  Filled 2014-02-16 (×4): qty 30

## 2014-02-16 MED ORDER — PROMETHAZINE HCL 25 MG PO TABS: 12.5000 mg | ORAL_TABLET | Freq: Four times a day (QID) | ORAL | Status: DC | PRN

## 2014-02-16 MED ORDER — DEXAMETHASONE 4 MG PO TABS
4.0000 mg | ORAL_TABLET | Freq: Every day | ORAL | Status: DC
  Administered 2014-02-17: 4 mg via ORAL
  Filled 2014-02-16 (×2): qty 1

## 2014-02-16 MED ORDER — MENTHOL 3 MG MT LOZG: 1.0000 | LOZENGE | OROMUCOSAL | Status: DC | PRN

## 2014-02-16 MED ORDER — CEFAZOLIN SODIUM 1-5 GM-% IV SOLN
1.0000 g | Freq: Three times a day (TID) | INTRAVENOUS | Status: AC
  Administered 2014-02-16 – 2014-02-17 (×2): 1 g via INTRAVENOUS
  Filled 2014-02-16 (×2): qty 50

## 2014-02-16 MED ORDER — HYDROCODONE-ACETAMINOPHEN 5-325 MG PO TABS: 1.0000 | ORAL_TABLET | ORAL | Status: DC | PRN

## 2014-02-16 MED ORDER — OXYCODONE-ACETAMINOPHEN 5-325 MG PO TABS
1.0000 | ORAL_TABLET | ORAL | Status: DC | PRN
  Administered 2014-02-16: 1 via ORAL
  Administered 2014-02-17: 2 via ORAL
  Administered 2014-02-17: 1 via ORAL
  Filled 2014-02-16 (×2): qty 1
  Filled 2014-02-16: qty 2

## 2014-02-16 MED ORDER — PHENOL 1.4 % MT LIQD: 1.0000 | OROMUCOSAL | Status: DC | PRN

## 2014-02-16 MED ORDER — CYCLOBENZAPRINE HCL 10 MG PO TABS: 10.0000 mg | ORAL_TABLET | Freq: Three times a day (TID) | ORAL | Status: DC | PRN

## 2014-02-16 MED ORDER — KETOROLAC TROMETHAMINE 15 MG/ML IJ SOLN
INTRAMUSCULAR | Status: DC | PRN
  Administered 2014-02-16: 15 mg via INTRAVENOUS

## 2014-02-16 MED ORDER — ONDANSETRON HCL 4 MG/2ML IJ SOLN
4.0000 mg | INTRAMUSCULAR | Status: DC | PRN
  Administered 2014-02-19: 4 mg via INTRAVENOUS
  Filled 2014-02-16: qty 2

## 2014-02-16 MED ORDER — SODIUM CHLORIDE 0.9 % IV SOLN
250.0000 mL | INTRAVENOUS | Status: DC
  Administered 2014-02-17: 250 mL via INTRAVENOUS

## 2014-02-16 MED ORDER — GABAPENTIN 300 MG PO CAPS
300.0000 mg | ORAL_CAPSULE | Freq: Two times a day (BID) | ORAL | Status: DC
  Administered 2014-02-16 – 2014-02-19 (×6): 300 mg via ORAL
  Filled 2014-02-16 (×8): qty 1

## 2014-02-16 MED ORDER — LACTATED RINGERS IV SOLN
INTRAVENOUS | Status: DC
  Administered 2014-02-16: 08:00:00 via INTRAVENOUS

## 2014-02-16 MED ORDER — FUROSEMIDE 20 MG PO TABS
20.0000 mg | ORAL_TABLET | Freq: Every day | ORAL | Status: DC
  Administered 2014-02-16 – 2014-02-19 (×4): 20 mg via ORAL
  Filled 2014-02-16 (×4): qty 1

## 2014-02-16 MED ORDER — SODIUM CHLORIDE 0.9 % IR SOLN
Status: DC | PRN
  Administered 2014-02-16: 10:00:00

## 2014-02-16 MED ORDER — SENNA 8.6 MG PO TABS
1.0000 | ORAL_TABLET | Freq: Two times a day (BID) | ORAL | Status: DC
  Administered 2014-02-16 – 2014-02-19 (×5): 8.6 mg via ORAL
  Filled 2014-02-16 (×8): qty 1

## 2014-02-16 MED ORDER — LIDOCAINE HCL (CARDIAC) 20 MG/ML IV SOLN
INTRAVENOUS | Status: AC
  Filled 2014-02-16: qty 5

## 2014-02-16 MED ORDER — LIDOCAINE HCL (CARDIAC) 20 MG/ML IV SOLN
INTRAVENOUS | Status: DC | PRN
  Administered 2014-02-16: 60 mg via INTRAVENOUS

## 2014-02-16 MED ORDER — LACTATED RINGERS IV SOLN
INTRAVENOUS | Status: DC | PRN
  Administered 2014-02-16 (×2): via INTRAVENOUS

## 2014-02-16 MED ORDER — PROPOFOL 10 MG/ML IV BOLUS
INTRAVENOUS | Status: DC | PRN
  Administered 2014-02-16: 200 mg via INTRAVENOUS

## 2014-02-16 MED ORDER — FENTANYL CITRATE 0.05 MG/ML IJ SOLN
INTRAMUSCULAR | Status: AC
  Filled 2014-02-16: qty 5

## 2014-02-16 MED ORDER — PROPOFOL 10 MG/ML IV BOLUS
INTRAVENOUS | Status: AC
  Filled 2014-02-16: qty 20

## 2014-02-16 MED ORDER — 0.9 % SODIUM CHLORIDE (POUR BTL) OPTIME
TOPICAL | Status: DC | PRN
  Administered 2014-02-16: 1000 mL

## 2014-02-16 MED ORDER — HYDROMORPHONE HCL PF 1 MG/ML IJ SOLN
0.2500 mg | INTRAMUSCULAR | Status: DC | PRN
  Administered 2014-02-16 (×4): 0.5 mg via INTRAVENOUS

## 2014-02-16 SURGICAL SUPPLY — 62 items
BAG DECANTER FOR FLEXI CONT (MISCELLANEOUS) ×3 IMPLANT
BENZOIN TINCTURE PRP APPL 2/3 (GAUZE/BANDAGES/DRESSINGS) ×3 IMPLANT
BLADE 10 SAFETY STRL DISP (BLADE) IMPLANT
BLADE SURG ROTATE 9660 (MISCELLANEOUS) IMPLANT
BRUSH SCRUB EZ PLAIN DRY (MISCELLANEOUS) ×3 IMPLANT
BUR CUTTER 7.0 ROUND (BURR) ×3 IMPLANT
CANISTER SUCT 3000ML (MISCELLANEOUS) ×3 IMPLANT
CLOSURE WOUND 1/2 X4 (GAUZE/BANDAGES/DRESSINGS) ×1
CONT SPEC 4OZ CLIKSEAL STRL BL (MISCELLANEOUS) ×3 IMPLANT
DECANTER SPIKE VIAL GLASS SM (MISCELLANEOUS) ×3 IMPLANT
DERMABOND ADHESIVE PROPEN (GAUZE/BANDAGES/DRESSINGS) ×2
DERMABOND ADVANCED (GAUZE/BANDAGES/DRESSINGS) ×2
DERMABOND ADVANCED .7 DNX12 (GAUZE/BANDAGES/DRESSINGS) ×1 IMPLANT
DERMABOND ADVANCED .7 DNX6 (GAUZE/BANDAGES/DRESSINGS) ×1 IMPLANT
DRAPE LAPAROTOMY 100X72X124 (DRAPES) ×3 IMPLANT
DRAPE MICROSCOPE ZEISS OPMI (DRAPES) ×3 IMPLANT
DRAPE POUCH INSTRU U-SHP 10X18 (DRAPES) ×3 IMPLANT
DRAPE PROXIMA HALF (DRAPES) IMPLANT
DRAPE SURG 17X23 STRL (DRAPES) ×6 IMPLANT
DRSG OPSITE POSTOP 3X4 (GAUZE/BANDAGES/DRESSINGS) ×3 IMPLANT
DURAPREP 26ML APPLICATOR (WOUND CARE) ×3 IMPLANT
DURASEAL APPLICATOR TIP (TIP) ×3 IMPLANT
DURASEAL SPINE SEALANT 3ML (MISCELLANEOUS) ×3 IMPLANT
ELECT REM PT RETURN 9FT ADLT (ELECTROSURGICAL) ×3
ELECTRODE REM PT RTRN 9FT ADLT (ELECTROSURGICAL) ×1 IMPLANT
GAUZE SPONGE 4X4 16PLY XRAY LF (GAUZE/BANDAGES/DRESSINGS) IMPLANT
GLOVE BIOGEL PI IND STRL 7.0 (GLOVE) ×1 IMPLANT
GLOVE BIOGEL PI INDICATOR 7.0 (GLOVE) ×2
GLOVE ECLIPSE 8.5 STRL (GLOVE) ×9 IMPLANT
GLOVE ECLIPSE 9.0 STRL (GLOVE) ×6 IMPLANT
GLOVE EXAM NITRILE LRG STRL (GLOVE) IMPLANT
GLOVE EXAM NITRILE MD LF STRL (GLOVE) IMPLANT
GLOVE EXAM NITRILE XL STR (GLOVE) IMPLANT
GLOVE EXAM NITRILE XS STR PU (GLOVE) IMPLANT
GLOVE SS BIOGEL STRL SZ 6.5 (GLOVE) ×2 IMPLANT
GLOVE SUPERSENSE BIOGEL SZ 6.5 (GLOVE) ×4
GOWN STRL REUS W/ TWL LRG LVL3 (GOWN DISPOSABLE) ×1 IMPLANT
GOWN STRL REUS W/ TWL XL LVL3 (GOWN DISPOSABLE) ×3 IMPLANT
GOWN STRL REUS W/TWL 2XL LVL3 (GOWN DISPOSABLE) IMPLANT
GOWN STRL REUS W/TWL LRG LVL3 (GOWN DISPOSABLE) ×2
GOWN STRL REUS W/TWL XL LVL3 (GOWN DISPOSABLE) ×6
GRAFT DURAGEN MATRIX 1WX1L (Tissue) ×3 IMPLANT
KIT BASIN OR (CUSTOM PROCEDURE TRAY) ×3 IMPLANT
KIT ROOM TURNOVER OR (KITS) ×3 IMPLANT
NEEDLE HYPO 22GX1.5 SAFETY (NEEDLE) ×3 IMPLANT
NEEDLE SPNL 22GX3.5 QUINCKE BK (NEEDLE) ×3 IMPLANT
NS IRRIG 1000ML POUR BTL (IV SOLUTION) ×3 IMPLANT
PACK LAMINECTOMY NEURO (CUSTOM PROCEDURE TRAY) ×3 IMPLANT
PAD ARMBOARD 7.5X6 YLW CONV (MISCELLANEOUS) ×9 IMPLANT
RUBBERBAND STERILE (MISCELLANEOUS) ×6 IMPLANT
SPONGE GAUZE 4X4 12PLY (GAUZE/BANDAGES/DRESSINGS) ×3 IMPLANT
SPONGE SURGIFOAM ABS GEL SZ50 (HEMOSTASIS) ×3 IMPLANT
STRIP CLOSURE SKIN 1/2X4 (GAUZE/BANDAGES/DRESSINGS) ×2 IMPLANT
SUT ETHILON 3 0 PS 1 (SUTURE) ×3 IMPLANT
SUT PROLENE 5 0 C1 (SUTURE) ×3 IMPLANT
SUT PROLENE 6 0 BV (SUTURE) ×3 IMPLANT
SUT VIC AB 2-0 CT1 18 (SUTURE) ×6 IMPLANT
SUT VIC AB 3-0 SH 8-18 (SUTURE) ×3 IMPLANT
SYR 20ML ECCENTRIC (SYRINGE) ×3 IMPLANT
TOWEL OR 17X24 6PK STRL BLUE (TOWEL DISPOSABLE) ×3 IMPLANT
TOWEL OR 17X26 10 PK STRL BLUE (TOWEL DISPOSABLE) ×3 IMPLANT
WATER STERILE IRR 1000ML POUR (IV SOLUTION) ×3 IMPLANT

## 2014-02-16 NOTE — Op Note (Signed)
Date of procedure: 02/16/2014  Date of dictation: Same  Service: Neurosurgery  Preoperative diagnosis: Left L2-3 herniated nucleus pulposus with stenosis, right L4-5 spondylosis and stenosis  Postoperative diagnosis: Same  Procedure Name: Left L2-3 laminotomy and microdiscectomy.  Right L4-5 decompressive laminotomy and right L4 and L5 decompressive foraminotomies  Surgeon:Willard Madrigal A.Agam Tuohy, M.D.  Asst. Surgeon: Ronnald Ramp  Anesthesia: General  Indication: Patient is an 80 year old female with severe back and lower chamois pain right greater than left. Workup demonstrates evidence of a grade 1 L4-5 degenerative spondylolisthesis with severe foraminal stenosis. Patient also has a very large central superior disc herniation at L2-3 causing marked spinal stenosis. Patient has failed conservative management and presents now for decompressive surgery in hopes of improving her symptoms.  Operative note: After induction anesthesia, patient positioned prone onto Wilson frame and appropriate padded. Lumbar region prepped and draped. Incision made overlying the L3-4 interspace. Retractor placed. X-ray taken. Level was found to be L3-4. Dissection proceeded 1 level cephalad. L2-3 laminotomy was then performed using high-speed drill and Kerrison rongeurs. Underlying thecal sac and L3 nerve root were notified. Laminotomy extended and the L2 nerve root was also identified. Microscope used for microdissection. Epidural venous plexus quite related and cut. Superior disc herniation dissected free and removed using pituitary rongeurs. The disc space incised a 15 blade all loose are obviously degenerative disc dura was removed from the interspace. All elements of the disc herniation were completely resected. All loose were obviously degenerative tear was removed and interspace. During the discectomy was noted there is a small amount of CSF draining from the axilla of the L2 nerve root. This was isolated and oversewn using  a 6-0 Prolene in a watertight fashion. Wound is then irrigated. DuraGen was placed over the dural repair. Tisseel was placed over the laminotomy defect. There is no evidence of a residual CSF leakage. Subperiosteal dissection then performed the right side at L4-5. Retractor placed. X-ray taken. Level confirmed. Decompressive laminotomies then performed using high-speed drill and Kerrison rongeurs. Underlying thecal sac was identified. Ligament limb elevated and resected piecemeal fashion. L4 and L5 nerve roots were fully decompressed as they exited the foramina. This point a very thorough decompression achieved. There is no evidence of injury to thecal sac and nerve roots at this level. Wounds an area and with MI solution. Gelfoam placed topically for hemostasis. Wounds and close in layers with Vicryl sutures. A running interlocking 3-0 nylon suture was placed at the surface. The patient tolerated the procedure well and she returns to the recovery room postop.

## 2014-02-16 NOTE — H&P (Signed)
Amber Knox is an 80 y.o. female.   Chief Complaint: Back and right leg pain HPI: Patient is an 80 year old female with severe back and right lower extremity pain. Symptoms are aggravated by standing or walking. Patient debilitated by this pain. Workup demonstrates evidence of a very large left paracentral disc herniation L2-3 with marked lumbar stenosis and thecal sac compression. At L4-5 on the right side the patient has spondylosis and stenosis. Patient being admitted for left-sided L2-3 and right-sided L4-5 decompressive surgery. Situation complicated by relatively recent pulmonary embolus. Patient has been cleared by both cardiology and pulmonology for surgery.  Past Medical History  Diagnosis Date  . DM type 2 (diabetes mellitus, type 2)   . Hyperlipemia   . Hypertension   . Osteopenia     Lumbar spine  . Hx of osteoarthritis     bilateral knee osteoarthritis  -   bilateral knee replacement  . Colon polyps     Tubular Adenoma  . Diverticulosis     patient unaware   . Peripheral neuropathy   . Hiatal hernia   . Heart murmur     no need to follow  . GERD (gastroesophageal reflux disease)     not on meds, tums prn  . Arthritis   . Hypothyroidism   . Gout   . DVT (deep venous thrombosis) 12-31-13  . Pulmonary embolus 12-31-13    Past Surgical History  Procedure Laterality Date  . Abdominal hysterectomy      excessive bleeding (IUD)  . Cataract extraction Right   . Cholecystectomy    . Cardiac catheterization  1995    [facility] - clean  . Eye surgery Right     Cataract  . Knee arthroplasty Bilateral   . Tonsillectomy    . Carpal tunnel release Right   . Colonoscopy    . Esophagogastroduodenoscopy (egd) with esophageal dilation    . Joint replacement Bilateral     knees    Family History  Problem Relation Age of Onset  . Vascular Disease Father     Cardio  . Diabetes type II Other   . Diabetes type II Other   . Breast cancer Paternal Aunt    Social History:   reports that she has never smoked. She has never used smokeless tobacco. She reports that she does not drink alcohol or use illicit drugs.  Allergies:  Allergies  Allergen Reactions  . Ace Inhibitors Cough  . Byetta 10 Mcg Pen [Exenatide] Other (See Comments)    Pancreatitis 02/2005  . Beta Adrenergic Blockers Swelling    Tongue swelling     Medications Prior to Admission  Medication Sig Dispense Refill  . allopurinol (ZYLOPRIM) 300 MG tablet Take 1 tablet (300 mg total) by mouth daily.  90 tablet  3  . amLODipine (NORVASC) 10 MG tablet Take 1 tablet (10 mg total) by mouth daily.      Marland Kitchen dexamethasone (DECADRON) 4 MG tablet Take 1 tablet (4 mg total) by mouth daily.      Marland Kitchen DIPHENHYD-LIDOCAINE-NYSTATIN MT Use as directed 5 mLs in the mouth or throat 4 (four) times daily as needed. Swish and Swallow      . enoxaparin (LOVENOX) 150 MG/ML injection Inject 1 mL (150 mg total) into the skin at bedtime.  0 Syringe    . famotidine (PEPCID) 20 MG tablet Take 20 mg by mouth every morning.      . Fe Fum-FA-B Cmp-C-Zn-Mg-Mn-Cu (HEMOCYTE PLUS) 106-1 MG CAPS Take 1 capsule  by mouth daily.      . furosemide (LASIX) 20 MG tablet Take 20 mg by mouth daily.      Marland Kitchen gabapentin (NEURONTIN) 300 MG capsule Take 1 capsule (300 mg total) by mouth 2 (two) times daily.  60 capsule  0  . levothyroxine (SYNTHROID, LEVOTHROID) 112 MCG tablet Take 1 tablet (112 mcg total) by mouth daily.  90 tablet  3  . Liraglutide (VICTOZA) 18 MG/3ML SOPN Inject 0.6 mg into the skin every morning.      Marland Kitchen oxyCODONE (OXY IR/ROXICODONE) 5 MG immediate release tablet Take 1 tablet (5 mg total) by mouth every 3 (three) hours as needed for moderate pain.  100 tablet  0  . polyethylene glycol (MIRALAX / GLYCOLAX) packet Take 17 g by mouth daily as needed for mild constipation.  14 each  0  . rivaroxaban (XARELTO) 20 MG TABS tablet Take 20 mg by mouth daily with supper.      Marland Kitchen tiZANidine (ZANAFLEX) 4 MG tablet Take 4 mg by mouth every 6 (six)  hours as needed for muscle spasms.      Marland Kitchen acetaminophen (TYLENOL) 325 MG tablet Take 650 mg by mouth every 4 (four) hours as needed for mild pain, moderate pain or fever.       . bisacodyl (DULCOLAX) 10 MG suppository Place 10 mg rectally daily as needed for moderate constipation.      . magnesium hydroxide (MILK OF MAGNESIA) 400 MG/5ML suspension Take 30 mLs by mouth daily as needed for mild constipation.      . ondansetron (ZOFRAN-ODT) 8 MG disintegrating tablet Take 8 mg by mouth every 8 (eight) hours as needed for nausea.       . promethazine (PHENERGAN) 12.5 MG tablet Take 12.5 mg by mouth every 6 (six) hours as needed for nausea.       . sodium phosphate (FLEET) enema Place 1 enema rectally daily as needed (constipation if no result from dulcolax). follow package directions        Results for orders placed during the hospital encounter of 02/16/14 (from the past 48 hour(s))  GLUCOSE, CAPILLARY     Status: Abnormal   Collection Time    02/16/14  7:38 AM      Result Value Ref Range   Glucose-Capillary 185 (*) 70 - 99 mg/dL  APTT     Status: Abnormal   Collection Time    02/16/14  7:48 AM      Result Value Ref Range   aPTT 20 (*) 24 - 37 seconds  PROTIME-INR     Status: None   Collection Time    02/16/14  7:48 AM      Result Value Ref Range   Prothrombin Time 13.9  11.6 - 15.2 seconds   INR 1.09  0.00 - 1.49   No results found.  A comprehensive review of systems was negative.  Blood pressure 161/58, pulse 78, temperature 98.4 F (36.9 C), temperature source Oral, resp. rate 18, height 5' (1.524 m), weight 108.863 kg (240 lb), SpO2 97.00%.  Patient awake and alert. She is oriented and appropriate. Cranial nerve function is intact. Motor and sensory function of the extremities normal. Straight raising is positive on the right negative on the left. In general patient is overweight but in no apparent distress currently. Examination head ears eyes (unremarkable. Chest and abdomen are  benign. Extremities are free from injury or deformity. Assessment/Plan Left L2-3 herniated pulposus with radiculopathy. Right L4-5 spondylosis with stenosis and  radiculopathy. Plan left L2-3 laminotomy and microdiscectomy and right L4-5 laminotomy and foraminotomies. Risks and benefits been explained. Patient wishes to proceed.  Cooper Render Dayden Viverette 02/16/2014, 9:47 AM

## 2014-02-16 NOTE — Anesthesia Procedure Notes (Signed)
Procedure Name: Intubation Date/Time: 02/16/2014 10:00 AM Performed by: Gaylene Brooks Pre-anesthesia Checklist: Patient identified, Timeout performed, Emergency Drugs available, Suction available and Patient being monitored Patient Re-evaluated:Patient Re-evaluated prior to inductionOxygen Delivery Method: Circle system utilized Preoxygenation: Pre-oxygenation with 100% oxygen Intubation Type: IV induction Ventilation: Mask ventilation without difficulty and Oral airway inserted - appropriate to patient size Laryngoscope Size: Sabra Heck and 2 Grade View: Grade I Tube type: Oral Tube size: 7.5 mm Number of attempts: 1 Airway Equipment and Method: Stylet Placement Confirmation: ETT inserted through vocal cords under direct vision,  breath sounds checked- equal and bilateral,  positive ETCO2 and CO2 detector Secured at: 21 cm Tube secured with: Tape Dental Injury: Teeth and Oropharynx as per pre-operative assessment

## 2014-02-16 NOTE — Progress Notes (Signed)
1425 pt arrived on the unit from pacu via stretcher with family at side. Pt A&O x4, denies any pain, vitals taken, no drain or foley noted, HC dsg to back incision with scant blood drainage but clean, and intact; pt oriented to the unit, call light within reach. Pt in bed comfortably with family at bedside. Will continue to monitor quietly. Francis Gaines Humphrey Guerreiro RN.

## 2014-02-16 NOTE — Transfer of Care (Signed)
Immediate Anesthesia Transfer of Care Note  Patient: Amber Knox  Procedure(s) Performed: Procedure(s): Left Lumbar two/three Microdiskectomy, Right Lumbar four/five decompression lumbar laminectomy   (N/A)  Patient Location: PACU  Anesthesia Type:General  Level of Consciousness: awake, alert  and oriented  Airway & Oxygen Therapy: Patient Spontanous Breathing and Patient connected to nasal cannula oxygen  Post-op Assessment: Report given to PACU RN, Post -op Vital signs reviewed and stable and Patient moving all extremities X 4  Post vital signs: Reviewed and stable  Complications: No apparent anesthesia complications

## 2014-02-16 NOTE — Anesthesia Postprocedure Evaluation (Signed)
Anesthesia Post Note  Patient: Amber Knox  Procedure(s) Performed: Procedure(s) (LRB): Left Lumbar two/three Microdiskectomy, Right Lumbar four/five decompression lumbar laminectomy   (N/A)  Anesthesia type: General  Patient location: PACU  Post pain: Pain level controlled  Post assessment: Patient's Cardiovascular Status Stable  Last Vitals:  Filed Vitals:   02/16/14 1300  BP: 150/70  Pulse: 71  Temp:   Resp: 19    Post vital signs: Reviewed and stable  Level of consciousness: alert  Complications: No apparent anesthesia complications

## 2014-02-16 NOTE — Progress Notes (Signed)
CARE MANAGEMENT NOTE 02/16/2014  Patient:  Amber Knox, Amber Knox   Account Number:  [id number]  Date Initiated:  02/16/2014  Documentation initiated by:  Olga Coaster  Subjective/Objective Assessment:   ADMITTED FOR SURGERY     Action/Plan:   CM FOLLOWING FOR DCP   Anticipated DC Date:  02/21/2014   Anticipated DC Plan:  AWAITING FOR PT/OT EVAL FOR DISPOSITION NEEDS     DC Planning Services  CM consult          Status of service:  In process, will continue to follow Medicare Important Message given?   (If response is "NO", the following Medicare IM given date fields will be blank)  Per UR Regulation:  Reviewed for med. necessity/level of care/duration of stay  Comments:  6/5/2015Mindi Slicker RN,BSN,MHA [phone]

## 2014-02-16 NOTE — Brief Op Note (Signed)
02/16/2014  12:07 PM  PATIENT:  Amber Knox  80 y.o. female  PRE-OPERATIVE DIAGNOSIS:  HNP  POST-OPERATIVE DIAGNOSIS:  HNP  PROCEDURE:  Procedure(s): Left Lumbar two/three Microdiskectomy, Right Lumbar four/five decompression lumbar laminectomy   (N/A)  SURGEON:  Surgeon(s) and Role:    * Charlie Pitter, MD - Primary    * Eustace Moore, MD - Assisting  PHYSICIAN ASSISTANT:   ASSISTANTS:    ANESTHESIA:   general  EBL:  Total I/O In: 1700 [I.V.:1700] Out: 400 [Blood:400]  BLOOD ADMINISTERED:none  DRAINS: none   LOCAL MEDICATIONS USED:  MARCAINE     SPECIMEN:  No Specimen  DISPOSITION OF SPECIMEN:  N/A  COUNTS:  YES  TOURNIQUET:  * No tourniquets in log *  DICTATION: .Dragon Dictation  PLAN OF CARE: Admit for overnight observation  PATIENT DISPOSITION:  PACU - hemodynamically stable.   Delay start of Pharmacological VTE agent (>24hrs) due to surgical blood loss or risk of bleeding: yes

## 2014-02-16 NOTE — Anesthesia Preprocedure Evaluation (Addendum)
Anesthesia Evaluation  Patient identified by MRN, date of birth, ID band Patient awake    Reviewed: Allergy & Precautions, H&P , NPO status , Patient's Chart, lab work & pertinent test results, reviewed documented beta blocker date and time   Airway Mallampati: II TM Distance: >3 FB Neck ROM: full    Dental  (+) Teeth Intact, Dental Advisory Given   Pulmonary neg pulmonary ROS,  breath sounds clear to auscultation        Cardiovascular hypertension, On Medications + Valvular Problems/Murmurs Rhythm:regular     Neuro/Psych  Neuromuscular disease negative psych ROS   GI/Hepatic negative GI ROS, Neg liver ROS, hiatal hernia, GERD-  Medicated and Controlled,  Endo/Other  diabetes, Oral Hypoglycemic AgentsHypothyroidism Morbid obesity  Renal/GU Renal disease  negative genitourinary   Musculoskeletal   Abdominal   Peds  Hematology  (+) anemia ,   Anesthesia Other Findings See surgeon's H&P   Reproductive/Obstetrics negative OB ROS                         Anesthesia Physical Anesthesia Plan  ASA: III  Anesthesia Plan: General   Post-op Pain Management:    Induction: Intravenous  Airway Management Planned: Oral ETT  Additional Equipment:   Intra-op Plan:   Post-operative Plan: Extubation in OR  Informed Consent: I have reviewed the patients History and Physical, chart, labs and discussed the procedure including the risks, benefits and alternatives for the proposed anesthesia with the patient or authorized representative who has indicated his/her understanding and acceptance.   Dental Advisory Given  Plan Discussed with: CRNA and Surgeon  Anesthesia Plan Comments:        Anesthesia Quick Evaluation

## 2014-02-16 NOTE — Clinical Social Work Psychosocial (Signed)
Clinical Social Work Department BRIEF PSYCHOSOCIAL ASSESSMENT 02/16/2014  Patient:  Amber Knox, Amber Knox     Account Number:  [id number]     Admit date:  02/16/2014  Clinical Social Worker:  Delrae Sawyers  Date/Time:  02/16/2014 03:30 PM  Referred by:  Physician  Date Referred:  02/16/2014 Referred for  SNF Placement   Other Referral:   none.   Interview type:  Family Other interview type:   CSW spoke with pt's daughter, Amber Knox [phone]).    PSYCHOSOCIAL DATA Living Status:  FACILITY Admitted from facility:  [facility] Level of care:  [employer] Primary support name:  Amber Knox Primary support relationship to patient:  CHILD, ADULT Degree of support available:   Strong support system.    CURRENT CONCERNS Current Concerns  Post-Acute Placement   Other Concerns:   none.    SOCIAL WORK ASSESSMENT / PLAN CSW received consult regarding pt's living status of SNF. CSW spoke with pt's daughter, Amber Knox, regarding pt's discharge disposition. Per pt's daughter, pt has been residing at [facility] since January 05, 2014. Pt's daughter stated family would prefer for pt to return once medically stable for discharge.    CSW to continue to follow and assist with discharge planning needs.   Assessment/plan status:  Psychosocial Support/Ongoing Assessment of Needs Other assessment/ plan:   none.   Information/referral to community resources:   Pt returning to [facility].    PATIENT'S/FAMILY'S RESPONSE TO PLAN OF CARE: Pt's daughter understanding and agreeable to CSW plan of care. Pt's daughter expressed no concerns or questions regading discharge disposition.       Lubertha Sayres, MSW, [facility] Licensed Clinical Social Worker [phone] and [phone]

## 2014-02-17 LAB — GLUCOSE, CAPILLARY
GLUCOSE-CAPILLARY: 384 mg/dL — AB (ref 70–99)
GLUCOSE-CAPILLARY: 223 mg/dL — AB (ref 70–99)
GLUCOSE-CAPILLARY: 288 mg/dL — AB (ref 70–99)
Glucose-Capillary: 252 mg/dL — ABNORMAL HIGH (ref 70–99)
Glucose-Capillary: 265 mg/dL — ABNORMAL HIGH (ref 70–99)

## 2014-02-17 MED ORDER — INSULIN ASPART 100 UNIT/ML ~~LOC~~ SOLN
0.0000 [IU] | Freq: Three times a day (TID) | SUBCUTANEOUS | Status: DC
  Administered 2014-02-17 (×2): 11 [IU] via SUBCUTANEOUS
  Administered 2014-02-18 (×2): 7 [IU] via SUBCUTANEOUS
  Administered 2014-02-18: 20 [IU] via SUBCUTANEOUS
  Administered 2014-02-19: 15 [IU] via SUBCUTANEOUS
  Administered 2014-02-19: 7 [IU] via SUBCUTANEOUS
  Administered 2014-02-19: 11 [IU] via SUBCUTANEOUS

## 2014-02-17 NOTE — Evaluation (Signed)
Occupational Therapy Evaluation Patient Details Name: Amber Knox MRN: [id number] DOB: 1930/07/21 Today's Date: 02/17/2014    History of Present Illness Left Lumbar two/three Microdiskectomy, Right Lumbar four/five decompression lumbar laminectomy.  Pt has been at [facility]) for rehab since May and plans to return for further rehab before eventual return home.   Clinical Impression   Pt s/p left L2-3 microdiskectomy, right L4-5 decompression lumbar lami.  She is from SNF [facility]) and will need to return to SNF (pt does not have 24/7 assist at home).  Pt is generally deconditioned- has not ambulated in 3 months. Will continue to follow acutely in order to address below problem list.     Follow Up Recommendations  SNF;Supervision/Assistance - 24 hour    Equipment Recommendations   (TBD next venue of care)    Recommendations for Other Services       Precautions / Restrictions Precautions Precautions: Back Precaution Comments: Educated pt on 3/3 back precautions.      Mobility Bed Mobility Overal bed mobility: Needs Assistance Bed Mobility: Rolling;Sidelying to Sit Rolling: Min guard Sidelying to sit: Min guard       General bed mobility comments: Cues for log roll technique and sequencing.  Transfers Overall transfer level: Needs assistance Equipment used: Rolling walker (2 wheeled) Transfers: Sit to/from [facility] Sit to Stand: Min assist Stand pivot transfers: Min assist       General transfer comment: VCs for hand placement.    Balance                                            ADL Overall ADL's : Needs assistance/impaired Eating/Feeding: Modified independent;Sitting           Lower Body Bathing: Moderate assistance;Sit to/from stand       Lower Body Dressing: Moderate assistance;Sit to/from stand   Toilet Transfer: Minimal assistance;Stand-pivot;BSC   Toileting- Clothing Manipulation and  Hygiene: Minimal assistance;Sit to/from stand       Functional mobility during ADLs: Minimal assistance;Rolling walker General ADL Comments: Pt with urinary incontinence upon standing.  Assisted pt to bedside commode and with bathing LEs.      Vision                     Perception     Praxis      Pertinent Vitals/Pain Pt reports 2/10 pain in back.     Hand Dominance     Extremity/Trunk Assessment Upper Extremity Assessment Upper Extremity Assessment: Overall WFL for tasks assessed           Communication Communication Communication: No difficulties   Cognition Arousal/Alertness: Awake/alert Behavior During Therapy: WFL for tasks assessed/performed Overall Cognitive Status: Within Functional Limits for tasks assessed                     General Comments       Exercises       Shoulder Instructions      Home Living Family/patient expects to be discharged to:: Skilled nursing facility                                        Prior Functioning/Environment Level of Independence: Needs assistance  Gait / Transfers Assistance Needed: Has not  ambulated distances since April 2015.  Uses RW for stand pivot transfers with assistance from nursing staff at SNF. ADL's / Homemaking Assistance Needed: assist from nursing staff at SNF        OT Diagnosis: Generalized weakness;Acute pain   OT Problem List: Decreased strength;Decreased activity tolerance;Decreased knowledge of use of DME or AE;Decreased knowledge of precautions;Pain;Impaired balance (sitting and/or standing)   OT Treatment/Interventions: Self-care/ADL training;DME and/or AE instruction;Therapeutic activities;Patient/family education;Balance training    OT Goals(Current goals can be found in the care plan section) Acute Rehab OT Goals Patient Stated Goal: to be able to walk again OT Goal Formulation: With patient Time For Goal Achievement: 03/03/14 Potential to Achieve  Goals: Good  OT Frequency: Min 2X/week   Barriers to D/C:            Co-evaluation              End of Session Equipment Utilized During Treatment: Gait belt;Rolling walker Nurse Communication: Mobility status  Activity Tolerance: Patient tolerated treatment well Patient left: in chair;with call bell/phone within reach;with chair alarm set   Time: [phone] OT Time Calculation (min): 31 min Charges:  OT General Charges $OT Visit: 1 Procedure OT Evaluation $Initial OT Evaluation Tier I: 1 Procedure OT Treatments $Self Care/Home Management : 8-22 mins $Therapeutic Activity: 8-22 mins G-Codes:    Luther Bradley 03-19-14, 9:54 AM  03-19-14 Luther Bradley OTR/L Pager [phone] Office [phone]

## 2014-02-17 NOTE — Progress Notes (Signed)
Subjective: Patient reports Feels reasonably comfortable but minimal mobilization to list point. Blood sugars have been increasing slightly. Home insulin is not available.  Objective: Vital signs in last 24 hours: Temp:  [97.9 F (36.6 C)-98.8 F (37.1 C)] 98.2 F (36.8 C) (06/06 0547) Pulse Rate:  [66-81] 80 (06/06 0547) Resp:  [10-25] 20 (06/06 0547) BP: (120-158)/(49-84) 158/84 mmHg (06/06 0547) SpO2:  [92 %-97 %] 96 % (06/06 0547)  Intake/Output from previous day: 06/05 0701 - 06/06 0700 In: 1700 [I.V.:1700] Out: 800 [Urine:400; Blood:400] Intake/Output this shift:    Incision is clean and dry. Motor function appears good and lower extremities.  Lab Results: No results found for this basename: WBC, HGB, HCT, PLT,  in the last 72 hours BMET No results found for this basename: NA, K, CL, CO2, GLUCOSE, BUN, CREATININE, CALCIUM,  in the last 72 hours  Studies/Results: Dg Lumbar Spine 1 View  02/16/2014   CLINICAL DATA:  Intraoperative localization for lumbar decompression at L4-5.  EXAM: LUMBAR SPINE - 1 VIEW  COMPARISON:  Spine film 1026 hr. MRI of the lumbar spine on 01/24/2014.  FINDINGS: The same numbering system was utilized for the previous plain film as well as the MRI study. Cross-table lateral view shows retractors and instruments opposite the L4-5 disc space level.  IMPRESSION: Intraoperative localization of the L4-5 disc space.   Electronically Signed   By: Aletta Edouard M.D.   On: 02/16/2014 11:58   Dg Lumbar Spine 1 View  02/16/2014   CLINICAL DATA:  L2-3, L4-5 discectomy.  EXAM: LUMBAR SPINE - 1 VIEW  COMPARISON:  10/03/2013  FINDINGS: Single cross-table portable image of the lumbar spine demonstrates posterior surgical instruments directed at the L3-4 level.  IMPRESSION: Intraoperative localization as above.   Electronically Signed   By: Rolm Baptise M.D.   On: 02/16/2014 11:00    Assessment/Plan: Physical therapy and occupational therapy to evaluate.  LOS: 1 day   Will write for sliding scale insulin. Plan discharge to rehabilitation on Monday   Amber Knox 02/17/2014, 8:58 AM

## 2014-02-17 NOTE — Progress Notes (Signed)
Patient assisted to bedside commode where she voided 400cc of yellow urine. Tolerated activity well.

## 2014-02-17 NOTE — Evaluation (Addendum)
Physical Therapy Evaluation Patient Details Name: Amber Knox MRN: [id number] DOB: [birth year] Today's Date: 02/17/2014   History of Present Illness  Left Lumbar two/three Microdiskectomy, Right Lumbar four/five decompression lumbar laminectomy.  Pt has been at [facility]) for rehab since May and plans to return for further rehab before eventual return home.  Clinical Impression  Pt adm from [facility] due to the above. Patient is s/p surgery listed above resulting in the deficits listed below (see PT Problem List). Patient will benefit from skilled PT to increase their independence and safety with mobility (while adhering to their precautions) to allow discharge to SNF.     Follow Up Recommendations SNF;Supervision/Assistance - 24 hour    Equipment Recommendations  None recommended by PT    Recommendations for Other Services OT consult     Precautions / Restrictions Precautions Precautions: Back;Fall Precaution Booklet Issued: Yes (comment) Precaution Comments: reviewed back precautions; pt able to recall 2/3 independently  Restrictions Weight Bearing Restrictions: No      Mobility  Bed Mobility Overal bed mobility: Needs Assistance Bed Mobility: Rolling;Sidelying to Sit Rolling: Min guard Sidelying to sit: Min guard       General bed mobility comments: not addressed;pt up in chair and returned to chair   Transfers Overall transfer level: Needs assistance Equipment used: Rolling walker (2 wheeled) Transfers: Sit to/from Stand Sit to Stand: Min assist Stand pivot transfers: Min assist       General transfer comment: pt unsteady with gt; requires max cues for hand placement and safety   Ambulation/Gait Ambulation/Gait assistance: Min assist Ambulation Distance (Feet): 12 Feet Assistive device: Rolling walker (2 wheeled) Gait Pattern/deviations: Step-through pattern;Decreased stride length;Shuffle;Wide base of support;Trunk flexed Gait velocity: very  decreased due to fatigue  Gait velocity interpretation: Below normal speed for age/gender General Gait Details: pt very unsteady and fatigued quickly with ambulation; min (A) to maintain balance and manage RW; requried standing rest break due to SOB; cues for upright posture and gt sequencing   Stairs            Wheelchair Mobility    Modified Rankin (Stroke Patients Only)       Balance Overall balance assessment: Needs assistance;History of Falls         Standing balance support: During functional activity;Bilateral upper extremity supported Standing balance-Leahy Scale: Poor Standing balance comment: requires (A) and UE support                              Pertinent Vitals/Pain C/o 2/10 pain; patient repositioned for comfort     Home Living Family/patient expects to be discharged to:: Skilled nursing facility                 Additional Comments: from SNF for rehab; plans to return due to living alone     Prior Function Level of Independence: Needs assistance   Gait / Transfers Assistance Needed: Has not ambulated distances since April 2015.  Uses RW for stand pivot transfers with assistance from nursing staff at SNF.  ADL's / Homemaking Assistance Needed: assist from nursing staff at SNF        Hand Dominance        Extremity/Trunk Assessment   Upper Extremity Assessment: Defer to OT evaluation           Lower Extremity Assessment: Generalized weakness      Cervical / Trunk Assessment: Kyphotic  Communication  Communication: No difficulties  Cognition Arousal/Alertness: Awake/alert Behavior During Therapy: WFL for tasks assessed/performed Overall Cognitive Status: Within Functional Limits for tasks assessed                      General Comments      Exercises General Exercises - Lower Extremity Ankle Circles/Pumps: AROM;Strengthening;Both;10 reps;Seated Long Arc Quad: AROM;Strengthening;Both;10 reps;Seated       Assessment/Plan    PT Assessment Patient needs continued PT services  PT Diagnosis Difficulty walking;Generalized weakness   PT Problem List Decreased strength;Decreased range of motion;Decreased activity tolerance;Decreased balance;Decreased mobility;Decreased safety awareness;Impaired sensation;Decreased knowledge of use of DME  PT Treatment Interventions DME instruction;Gait training;Functional mobility training;Therapeutic activities;Therapeutic exercise;Balance training;Neuromuscular re-education;Patient/family education   PT Goals (Current goals can be found in the Care Plan section) Acute Rehab PT Goals Patient Stated Goal: to be able to walk again PT Goal Formulation: With patient Time For Goal Achievement: 02/24/14 Potential to Achieve Goals: Good    Frequency Min 3X/week   Barriers to discharge Decreased caregiver support lives alone    Co-evaluation               End of Session Equipment Utilized During Treatment: Gait belt Activity Tolerance: Patient limited by fatigue Patient left: in chair;with call bell/phone within reach;with chair alarm set Nurse Communication: Mobility status         Time: [id number] PT Time Calculation (min): 17 min   Charges:   PT Evaluation $Initial PT Evaluation Tier I: 1 Procedure PT Treatments $Gait Training: 8-22 mins   PT G CodesKennis Carina [place], [place] 02/17/2014, 12:24 PM

## 2014-02-18 LAB — GLUCOSE, CAPILLARY
GLUCOSE-CAPILLARY: 355 mg/dL — AB (ref 70–99)
Glucose-Capillary: 220 mg/dL — ABNORMAL HIGH (ref 70–99)
Glucose-Capillary: 223 mg/dL — ABNORMAL HIGH (ref 70–99)

## 2014-02-18 MED ORDER — DEXAMETHASONE 2 MG PO TABS
2.0000 mg | ORAL_TABLET | Freq: Every day | ORAL | Status: DC
  Administered 2014-02-18 – 2014-02-19 (×2): 2 mg via ORAL
  Filled 2014-02-18 (×2): qty 1

## 2014-02-18 MED ORDER — OXYCODONE-ACETAMINOPHEN 5-325 MG PO TABS
1.0000 | ORAL_TABLET | ORAL | Status: DC | PRN
  Administered 2014-02-19 (×2): 1 via ORAL
  Filled 2014-02-18 (×2): qty 1

## 2014-02-18 NOTE — Progress Notes (Addendum)
C/o burping continuously  Today; now onset of nausea; no vomiting; passed gas on the commode earlier today; bowel sounds are active; abd. Soft; nontender; medicated prn; encouraged OOB activity; patient tolerated walking in room well and time in the chair.  Encouraged soft food choices ; family bringing in food in addition to the served food from the hospital.  CBG's remain elevated; utilizing the ssc insulin as ordered; patient doesn't have her victoza; med. Unavailable from pharmacy; also reports she stopped taking metformin; Dr. Ronnald Ramp decreased decadron to 2mg  daily.

## 2014-02-18 NOTE — Progress Notes (Signed)
Patient ID: Amber Knox, female   DOB: Sep 18, 1929, 80 y.o.   MRN: [id number] Subjective: Patient reports she's doing better. Back soreness 4/10. Some right leg pain. Some numbness. Walking better.  Objective: Vital signs in last 24 hours: Temp:  [98 F (36.7 C)-99.1 F (37.3 C)] 98 F (36.7 C) (06/07 0502) Pulse Rate:  [74-95] 95 (06/07 0502) Resp:  [18-20] 18 (06/07 0502) BP: (112-151)/(46-80) 149/80 mmHg (06/07 0502) SpO2:  [92 %-100 %] 100 % (06/07 0502)  Intake/Output from previous day:   Intake/Output this shift:    Neurologic: Grossly normal  Lab Results: Lab Results  Component Value Date   WBC 15.1* 02/08/2014   HGB 12.7 02/08/2014   HCT 38.6 02/08/2014   MCV 89.8 02/08/2014   PLT 246 02/08/2014   Lab Results  Component Value Date   INR 1.09 02/16/2014   BMET Lab Results  Component Value Date   NA 135* 02/08/2014   K 5.0 02/08/2014   CL 93* 02/08/2014   CO2 27 02/08/2014   GLUCOSE 347* 02/08/2014   BUN 43* 02/08/2014   CREATININE 1.18* 02/08/2014   CALCIUM 9.3 02/08/2014    Studies/Results: Dg Lumbar Spine 1 View  02/16/2014   CLINICAL DATA:  Intraoperative localization for lumbar decompression at L4-5.  EXAM: LUMBAR SPINE - 1 VIEW  COMPARISON:  Spine film 1026 hr. MRI of the lumbar spine on 01/24/2014.  FINDINGS: The same numbering system was utilized for the previous plain film as well as the MRI study. Cross-table lateral view shows retractors and instruments opposite the L4-5 disc space level.  IMPRESSION: Intraoperative localization of the L4-5 disc space.   Electronically Signed   By: Aletta Edouard M.D.   On: 02/16/2014 11:58   Dg Lumbar Spine 1 View  02/16/2014   CLINICAL DATA:  L2-3, L4-5 discectomy.  EXAM: LUMBAR SPINE - 1 VIEW  COMPARISON:  10/03/2013  FINDINGS: Single cross-table portable image of the lumbar spine demonstrates posterior surgical instruments directed at the L3-4 level.  IMPRESSION: Intraoperative localization as above.   Electronically Signed    By: Rolm Baptise M.D.   On: 02/16/2014 11:00    Assessment/Plan: Doing as expected. SNF tomorrow?   LOS: 2 days    Eustace Moore 02/18/2014, 9:33 AM

## 2014-02-19 LAB — GLUCOSE, CAPILLARY
GLUCOSE-CAPILLARY: 227 mg/dL — AB (ref 70–99)
GLUCOSE-CAPILLARY: 261 mg/dL — AB (ref 70–99)
Glucose-Capillary: 248 mg/dL — ABNORMAL HIGH (ref 70–99)

## 2014-02-19 MED ORDER — OXYCODONE HCL 5 MG PO TABS: 5.0000 mg | ORAL_TABLET | ORAL | Status: DC | PRN

## 2014-02-19 NOTE — Discharge Summary (Signed)
Physician Discharge Summary  Patient ID: Amber Knox MRN: [id number] DOB/AGE: 78-01-1930 78 y.o.  Admit date: 02/16/2014 Discharge date: 02/19/2014  Admission Diagnoses:  Discharge Diagnoses:  Principal Problem:   HNP (herniated nucleus pulposus), lumbar Active Problems:   Lumbar disc herniation with radiculopathy   Discharged Condition: good  Hospital Course: The patient was admitted to the hospital where she underwent an uncomplicated left-sided V0-3 laminotomy and microdiscectomy and right-sided L4-5 decompressive laminotomy and foraminotomy. Postoperatively she is done very well. She's had great relief of her preoperative right lower extremity pain. She is now able to stand and ambulate with therapy which is much improved from preop. She reports minimal incisional pain and no radicular pain at present. She is ready for discharge back to her skilled nursing facility.    The patient has been off for anticoagulation during her perioperative period. She may be resumed on xarelto starting tomorrow  Consults:   Significant Diagnostic Studies:   Treatments:   Discharge Exam: Blood pressure 154/60, pulse 104, temperature 98.9 F (37.2 C), temperature source Oral, resp. rate 18, height 5' (1.524 m), weight 108.863 kg (240 lb), SpO2 94.00%. Awake and alert. Oriented and appropriate. Cranial nerve function intact. Motor and sensory function of her extremities normal. Wound clean and dry. Chest and abdomen benign.  Disposition: 03-Skilled Nursing Facility     Medication List         acetaminophen 325 MG tablet  Commonly known as:  TYLENOL  Take 650 mg by mouth every 4 (four) hours as needed for mild pain, moderate pain or fever.     allopurinol 300 MG tablet  Commonly known as:  ZYLOPRIM  Take 1 tablet (300 mg total) by mouth daily.     amLODipine 10 MG tablet  Commonly known as:  NORVASC  Take 1 tablet (10 mg total) by mouth daily.     bisacodyl 10 MG suppository   Commonly known as:  DULCOLAX  Place 10 mg rectally daily as needed for moderate constipation.     dexamethasone 4 MG tablet  Commonly known as:  DECADRON  Take 1 tablet (4 mg total) by mouth daily.     DIPHENHYD-LIDOCAINE-NYSTATIN MT  Use as directed 5 mLs in the mouth or throat 4 (four) times daily as needed. Swish and Swallow     enoxaparin 150 MG/ML injection  Commonly known as:  LOVENOX  Inject 1 mL (150 mg total) into the skin at bedtime.     famotidine 20 MG tablet  Commonly known as:  PEPCID  Take 20 mg by mouth every morning.     furosemide 20 MG tablet  Commonly known as:  LASIX  Take 20 mg by mouth daily.     gabapentin 300 MG capsule  Commonly known as:  NEURONTIN  Take 1 capsule (300 mg total) by mouth 2 (two) times daily.     HEMOCYTE PLUS 106-1 MG Caps  Take 1 capsule by mouth daily.     levothyroxine 112 MCG tablet  Commonly known as:  SYNTHROID, LEVOTHROID  Take 1 tablet (112 mcg total) by mouth daily.     magnesium hydroxide 400 MG/5ML suspension  Commonly known as:  MILK OF MAGNESIA  Take 30 mLs by mouth daily as needed for mild constipation.     ondansetron 8 MG disintegrating tablet  Commonly known as:  ZOFRAN-ODT  Take 8 mg by mouth every 8 (eight) hours as needed for nausea.     oxyCODONE 5 MG immediate release tablet  Commonly known as:  Oxy IR/ROXICODONE  Take 1-2 tablets (5-10 mg total) by mouth every 3 (three) hours as needed for moderate pain.     polyethylene glycol packet  Commonly known as:  MIRALAX / GLYCOLAX  Take 17 g by mouth daily as needed for mild constipation.     promethazine 12.5 MG tablet  Commonly known as:  PHENERGAN  Take 12.5 mg by mouth every 6 (six) hours as needed for nausea.     rivaroxaban 20 MG Tabs tablet  Commonly known as:  XARELTO  Take 20 mg by mouth daily with supper.     sodium phosphate enema  Commonly known as:  FLEET  Place 1 enema rectally daily as needed (constipation if no result from  dulcolax). follow package directions     tiZANidine 4 MG tablet  Commonly known as:  ZANAFLEX  Take 4 mg by mouth every 6 (six) hours as needed for muscle spasms.     VICTOZA 18 MG/3ML Sopn  Generic drug:  Liraglutide  Inject 0.6 mg into the skin every morning.           Follow-up Information   Follow up with Melbert Botelho A, MD. Schedule an appointment as soon as possible for a visit in 2 weeks.   Specialty:  Neurosurgery   Contact information:   [address] [place] [address] [phone]       Signed: Cooper Render Haylen Shelnutt 02/19/2014, 5:34 PM

## 2014-02-19 NOTE — Discharge Instructions (Signed)

## 2014-02-19 NOTE — Progress Notes (Signed)
Physical Therapy Treatment Patient Details Name: Amber Knox MRN: [id number] DOB: August 20, 1930 Today's Date: 02/19/2014    History of Present Illness Left Lumbar two/three Microdiskectomy, Right Lumbar four/five decompression lumbar laminectomy.  Pt has been at [facility]) for rehab since May and plans to return for further rehab before eventual return home.    PT Comments    Pt progressing well. Pt remains to have limited ambulation tolerance due to onset of L LE pain and weakness. Pt to con't to benefit from ST-SNF Upon d/c to achieve safe mod I function for safe transition home alone.  Follow Up Recommendations  SNF;Supervision/Assistance - 24 hour     Equipment Recommendations       Recommendations for Other Services       Precautions / Restrictions Precautions Precautions: Back;Fall Precaution Booklet Issued: Yes (comment) Precaution Comments: reviewed back precautions, pt able to recall 2/3, 3rd one with v/c's Restrictions Weight Bearing Restrictions: No    Mobility  Bed Mobility Overal bed mobility: Needs Assistance Bed Mobility: Rolling;Sidelying to Sit Rolling: Min guard Sidelying to sit: Min guard       General bed mobility comments: v/c's to maintain back precautions  Transfers Overall transfer level: Needs assistance Equipment used: Rolling walker (2 wheeled) Transfers: Sit to/from Stand Sit to Stand: Min assist Stand pivot transfers: Min assist       General transfer comment: increased time, v/c's to limit bending  Ambulation/Gait Ambulation/Gait assistance: Min assist Ambulation Distance (Feet): 20 Feet (40) Assistive device: Rolling walker (2 wheeled) Gait Pattern/deviations: Decreased stride length;Step-to pattern;Wide base of support Gait velocity: significantly decreased Gait velocity interpretation: Below normal speed for age/gender General Gait Details: pt with antalgic gait, onset of L LE pain limiting tolerance of amb. pt with  multiple standing rest breaks   Stairs            Wheelchair Mobility    Modified Rankin (Stroke Patients Only)       Balance Overall balance assessment: Needs assistance Sitting-balance support: Feet supported Sitting balance-Leahy Scale: Fair     Standing balance support: Bilateral upper extremity supported Standing balance-Leahy Scale: Poor Standing balance comment: requires use of RW to maintain upright posture                    Cognition Arousal/Alertness: Awake/alert Behavior During Therapy: WFL for tasks assessed/performed Overall Cognitive Status: Within Functional Limits for tasks assessed                      Exercises      General Comments        Pertinent Vitals/Pain 4/10 L LE pain at rest, 5-6/10 L LE pain during amb    Home Living                      Prior Function            PT Goals (current goals can now be found in the care plan section) Progress towards PT goals: Progressing toward goals    Frequency  Min 3X/week    PT Plan Current plan remains appropriate    Co-evaluation             End of Session Equipment Utilized During Treatment: Gait belt Activity Tolerance: Patient limited by pain Patient left: in chair;with call bell/phone within reach     Time: 1416-1439 PT Time Calculation (min): 23 min  Charges:  $Gait Training: 8-22 mins $Therapeutic  Activity: 8-22 mins                    G Codes:      Liliahna Cudd M Mandee Pluta Mar 06, 2014, 2:49 PM  Kittie Plater, PT, DPT Pager #: [phone] Office #: [phone]

## 2014-02-19 NOTE — Progress Notes (Signed)
Inpatient Diabetes Program Recommendations  AACE/ADA: New Consensus Statement on Inpatient Glycemic Control (2013)  Target Ranges:  Prepandial:   less than 140 mg/dL      Peak postprandial:   less than 180 mg/dL (1-2 hours)      Critically ill patients:  140 - 180 mg/dL    Results for GAE, BIHL (MRN [id number]) as of 02/19/2014 10:48  Ref. Range 02/18/2014 07:00 02/18/2014 12:10 02/18/2014 17:00 02/19/2014 06:43  Glucose-Capillary Latest Range: 70-99 mg/dL 220 (H) 223 (H) 355 (H) 227 (H)   Diabetes history: DM2 Outpatient Diabetes medications: Victoza 0.6 mg QAM Current orders for Inpatient glycemic control: Novolog 0-20 units AC, Victoza 0.6 mg QAM (however Victoza is not available from pharmacy so patient not receiving as an inpatient)  Inpatient Diabetes Program Recommendations Insulin - Basal: While inpatient and ordered Decadron, please consider ordering low dose basal insulin; recommend starting with Levemir 10 units daily. Insulin - Meal Coverage: While inpatient and ordered Decadron, please consider ordering Novolog 4 units TID with meals for meal coverage (in addition to Novolog correction scale). Insulin-Correction: Please consider ordering Novolog bedtime correction scale. HgbA1C: Please consider ordering an A1C to evaluate glycemic control over the past 2-3 months.  Thanks, Barnie Alderman, RN, MSN, CCRN Diabetes Coordinator Inpatient Diabetes Program [phone] (Team Pager) [phone] (AP office) [phone] [facility] office)

## 2014-02-19 NOTE — Clinical Social Work Note (Addendum)
CSW received call from [facility] regarding insurance authorization. CSW provided facility with appropriate documentation to complete SNF authorization prior to admission to [facility]. CSW continuing to follow and assist with discharge planning needs.  UPDATE Per [place] [facility] admissions liaison, pt is able to return to [facility] once medically stable for discharge. RN updated with this information.  If pt is deemed to be medically stable for discharge on the afternoon of 02/19/2014 by MD, CSW has prepared discharge packet and instructions for RN. RN to first, please call Margarita Grizzle [phone]) with admissions at [facility] to inform facility of pt's discharge. Second, RN to print discharge summary and fax discharge summary to [facility] at [phone] (Please fax to [phone] if after 3pm). Third, RN to include printed discharge summary and signed FL2 to already began discharge packet (placed on pt's shadow chart). Finally, RN to update pt's daughter regarding discharge. Per RN, pt's daughter aware of possible discharge on 02/19/2014, and pt's daughter will be transporting pt via family vehicle at time of discharge.  CSW has prepared EMS ([facility]) ambulance transportation for in the event pt's family unable to provide transportation. To schedule, RN to please call [facility] at [phone].  RN to please call report at [phone].  Lubertha Sayres, MSW, [facility] Licensed Clinical Social Worker [phone] and [phone]

## 2014-02-19 NOTE — Progress Notes (Signed)
Dr. Annette Stable ready to discharge patient; report called to [facility]; faxed discharge summary; called Margarita Grizzle with admissions to inform her of the fax and family will transport the patient themselves this evening.  Discharge packet given to daughter to give to [facility]; [facility] form, Rx for pain, discharge summary, and chart copy in this packet. Report received by RN Jackelyn Poling, patient ready for discharge.

## 2014-02-20 ENCOUNTER — Encounter (HOSPITAL_COMMUNITY): Payer: Self-pay | Admitting: Neurosurgery

## 2014-02-20 LAB — GLUCOSE, CAPILLARY: GLUCOSE-CAPILLARY: 313 mg/dL — AB (ref 70–99)

## 2014-02-26 ENCOUNTER — Telehealth: Payer: Self-pay

## 2014-02-26 ENCOUNTER — Other Ambulatory Visit: Payer: Self-pay | Admitting: Family Medicine

## 2014-02-26 DIAGNOSIS — K219 Gastro-esophageal reflux disease without esophagitis: Secondary | ICD-10-CM

## 2014-02-26 MED ORDER — DEXLANSOPRAZOLE 60 MG PO CPDR: 60.0000 mg | DELAYED_RELEASE_CAPSULE | Freq: Every day | ORAL | Status: AC

## 2014-02-26 NOTE — Telephone Encounter (Signed)
[name] has returned to the skilled nursing facility after her back surgery.  She has been having a lot of GERD.  Her granddaughter gave her some of her Dexilant which has helped her stomach.  A prescription was sent in to [facility].

## 2014-02-26 NOTE — Telephone Encounter (Signed)
Melody the granddaughter of Dariel called and would like for you to call her back at [phone], she stated the her grandmother had back surgery and was doing well, but now she is not so she wants to talk about reflux and her not doing good now.

## 2014-03-30 ENCOUNTER — Encounter: Payer: Self-pay | Admitting: Family Medicine

## 2014-03-30 ENCOUNTER — Ambulatory Visit (INDEPENDENT_AMBULATORY_CARE_PROVIDER_SITE_OTHER): Payer: Medicare Other | Admitting: Family Medicine

## 2014-03-30 VITALS — BP 160/68 | HR 83 | Resp 16

## 2014-03-30 DIAGNOSIS — I10 Essential (primary) hypertension: Secondary | ICD-10-CM

## 2014-03-30 DIAGNOSIS — Z201 Contact with and (suspected) exposure to tuberculosis: Secondary | ICD-10-CM

## 2014-03-30 DIAGNOSIS — E1165 Type 2 diabetes mellitus with hyperglycemia: Secondary | ICD-10-CM

## 2014-03-30 DIAGNOSIS — M79604 Pain in right leg: Secondary | ICD-10-CM

## 2014-03-30 DIAGNOSIS — K219 Gastro-esophageal reflux disease without esophagitis: Secondary | ICD-10-CM

## 2014-03-30 DIAGNOSIS — M79609 Pain in unspecified limb: Secondary | ICD-10-CM

## 2014-03-30 DIAGNOSIS — IMO0001 Reserved for inherently not codable concepts without codable children: Secondary | ICD-10-CM

## 2014-03-30 DIAGNOSIS — Z111 Encounter for screening for respiratory tuberculosis: Secondary | ICD-10-CM

## 2014-03-30 DIAGNOSIS — R609 Edema, unspecified: Secondary | ICD-10-CM

## 2014-03-30 LAB — POCT GLYCOSYLATED HEMOGLOBIN (HGB A1C): Hemoglobin A1C: 7.9

## 2014-03-30 MED ORDER — LIRAGLUTIDE 18 MG/3ML ~~LOC~~ SOPN: PEN_INJECTOR | SUBCUTANEOUS | Status: DC

## 2014-03-30 MED ORDER — DULOXETINE HCL 60 MG PO CPEP: 60.0000 mg | ORAL_CAPSULE | Freq: Every day | ORAL | Status: DC

## 2014-03-30 MED ORDER — OXYCODONE-ACETAMINOPHEN 10-325 MG PO TABS: ORAL_TABLET | ORAL | Status: DC

## 2014-03-30 MED ORDER — METOLAZONE 5 MG PO TABS: 5.0000 mg | ORAL_TABLET | ORAL | Status: DC

## 2014-03-30 MED ORDER — TIZANIDINE HCL 4 MG PO TABS: 4.0000 mg | ORAL_TABLET | Freq: Every day | ORAL | Status: DC

## 2014-03-30 MED ORDER — INSULIN PEN NEEDLE 31G X 8 MM MISC: 1.0000 "pen " | Freq: Every day | Status: DC

## 2014-03-30 MED ORDER — FUROSEMIDE 40 MG PO TABS: 40.0000 mg | ORAL_TABLET | Freq: Every day | ORAL | Status: AC

## 2014-03-30 MED ORDER — GLIPIZIDE ER 5 MG PO TB24: 5.0000 mg | ORAL_TABLET | Freq: Every day | ORAL | Status: DC

## 2014-03-30 MED ORDER — IRBESARTAN 300 MG PO TABS: 300.0000 mg | ORAL_TABLET | Freq: Every day | ORAL | Status: DC

## 2014-03-30 MED ORDER — CANAGLIFLOZIN 300 MG PO TABS: 1.0000 | ORAL_TABLET | Freq: Every day | ORAL | Status: DC

## 2014-03-30 MED ORDER — POTASSIUM CHLORIDE CRYS ER 20 MEQ PO TBCR: 20.0000 meq | EXTENDED_RELEASE_TABLET | Freq: Two times a day (BID) | ORAL | Status: DC

## 2014-03-30 MED ORDER — ACARBOSE 50 MG PO TABS: 50.0000 mg | ORAL_TABLET | Freq: Three times a day (TID) | ORAL | Status: DC

## 2014-03-30 NOTE — Progress Notes (Signed)
Subjective:    Patient ID: Amber Knox, female    DOB: January 10, 1930, 80 y.o.   MRN: [id number]  HPI  Cyrena is here today with several of her family members (Melody, Lorie Apley & Caren Griffins) needing to have numerous forms completed including a FL2 form.  She was discharged from [facility] in [place] today and is moving into the [facility] which used to be [facility] on [address]. She needs orders for PT/OT and also needs to have a PPD place. The assisant living facility will read the PPD in 48 - 72 hrs and will place another one according to their protocol.   Overall, Ms. Cori is not doing that great.  Since her fall back in December, she has really gone downhill.  She was originally scheduled for back surgery in April and developed a PE the day before the surgery which really set her back. She and her family were told that she had "metastic cancer" and so for a couple of weeks, both she and her family thought she had terminal cancer. This turned out to be false and Dr. Trenton Gammon proceeded with her back surgery in June.  She appeared to do great right after the surgery and was up walking in the hospital.  She returned to [facility] and has essentially been in the bed ever since. She is now complaining of pain in her right hip/upper leg.  She recently saw Dr. Trenton Gammon and was told she has bursitis.   She received an injection in her right leg which really does not seem to have helped.   Ms. Carlisle sugars were great (A1c 5.9%) prior to January.  This control changed with high dosages of steroids, poor diet and immobility.  She has been mostly treated with insulin over the past few months.    She has gained 20 lb since her fall in December and has significant edema.  She is motivated to get better because she wants to do home.      Review of Systems  Constitutional: Positive for activity change (recent back surgery) and unexpected weight change (20 lbs).    Respiratory: Negative for chest tightness.   Cardiovascular: Positive for leg swelling. Negative for chest pain and palpitations.  Gastrointestinal: Positive for abdominal distention. Negative for abdominal pain, diarrhea (She had some when she first started on Dexilant. This has improved.  ) and constipation.  Endocrine: Negative for polydipsia, polyphagia and polyuria.  Genitourinary: Negative for dysuria and difficulty urinating.  Musculoskeletal: Positive for arthralgias and gait problem.       Pain in her right leg   Psychiatric/Behavioral: Positive for dysphoric mood (Her family feels that she is depressed.  ).  All other systems reviewed and are negative.    Past Medical History  Diagnosis Date  . DM type 2 (diabetes mellitus, type 2)   . Hyperlipemia   . Hypertension   . Osteopenia     Lumbar spine  . Hx of osteoarthritis     bilateral knee osteoarthritis  -   bilateral knee replacement  . Colon polyps     Tubular Adenoma  . Diverticulosis   . Peripheral neuropathy   . Hiatal hernia   . Heart murmur   . GERD (gastroesophageal reflux disease)   . Hypothyroidism   . Gout   . DVT (deep venous thrombosis) 12-31-13  . Pulmonary embolus 12-31-13  . Gallstone      Past Surgical History  Procedure Laterality Date  . Abdominal hysterectomy      Excessive Bleeding (IUD)   . Cataract extraction Right   . Cholecystectomy      Gallstones   . Cardiac catheterization  1995    [facility] (Normal)   . Knee arthroplasty Bilateral   . Tonsillectomy    . Carpal tunnel release Right   . Colonoscopy    . Esophagogastroduodenoscopy (egd) with esophageal dilation    . Joint replacement Bilateral     Knees   . 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 Vascular Disease Father     Cardio  . Diabetes type II Other   . Diabetes type II Other   . Breast cancer Paternal Aunt      No current facility-administered medications on file prior to visit.   Current Outpatient Prescriptions on File Prior to Visit  Medication Sig Dispense Refill  . allopurinol (ZYLOPRIM) 300 MG tablet Take 1 tablet (300 mg total) by mouth daily.  90 tablet  3  . dexlansoprazole (DEXILANT) 60 MG capsule Take 1 capsule (60 mg total) by mouth daily.  30 capsule  11  . Fe Fum-FA-B Cmp-C-Zn-Mg-Mn-Cu (HEMOCYTE PLUS) 106-1 MG CAPS Take 1 capsule by mouth daily.      Marland Kitchen levothyroxine (SYNTHROID, LEVOTHROID) 112 MCG tablet Take 1 tablet (112 mcg total) by mouth daily.  90 tablet  3  . magnesium hydroxide (MILK OF MAGNESIA) 400 MG/5ML suspension Take 30 mLs by mouth daily as needed for mild constipation.      . ondansetron (ZOFRAN-ODT) 8 MG disintegrating tablet Take 8 mg by mouth 3 (three) times daily before meals.       . polyethylene glycol (MIRALAX / GLYCOLAX) packet Take 17 g by mouth daily as needed for mild constipation.  14 each  0  . promethazine (PHENERGAN) 12.5 MG tablet Take 12.5 mg by mouth every 6 (six) hours as needed for nausea.       . rivaroxaban (XARELTO) 20 MG TABS tablet Take 20 mg by mouth daily with supper.      . gabapentin (NEURONTIN) 300 MG capsule Take 1 capsule (300 mg total) by mouth 2 (two) times daily.  60 capsule  0     Allergies   Allergen Reactions  . Ace Inhibitors Swelling    Laryngeal Edema   . Beta Adrenergic Blockers Swelling    Tongue swelling      Immunization History  Administered Date(s) Administered  . Influenza,inj,Quad PF,36+ Mos 07/19/2013  . PPD Test 03/30/2014  . Pneumococcal Conjugate-13 08/17/2005  . Tdap 06/10/2011  . Zoster 02/13/2008       Objective:   Physical Exam  Vitals reviewed. Constitutional: She  is oriented to person, place, and time. She appears well-nourished. She is cooperative. No distress.  She is sitting in a wheelchair.    HENT:  Head: Normocephalic.  Eyes: No scleral icterus.  Neck: Neck supple.  Cardiovascular: Normal rate and regular rhythm.   Pulmonary/Chest: Effort normal and breath sounds normal.  Abdominal: She exhibits distension. There is no tenderness.  Musculoskeletal: She exhibits edema and tenderness.  Neurological: She is alert and oriented to person, place, and time.  Skin: No rash noted.  Psychiatric: Her behavior is normal. Judgment and thought content normal.  She appears to be down.       Assessment & Plan:    Arlet was seen today for multiple medical issues.  Diagnoses and associated orders for this visit:  Essential hypertension, benign Comments: Her BP is not controlled with only amlodipine.  She has taken ARBs for years so we'll get her back on one.  She was given Avapro.   - irbesartan (AVAPRO) 300 MG tablet; Take 1 tablet (300 mg total) by mouth daily.  Gastroesophageal reflux disease without esophagitis Comments: She is doing well on Dexilant.    PPD screening test Comments: This was placed and will be read by [facility].   - PPD  Myalgia and myositis Comments: She was given Cymbalta again.  She has been given this in the past but I feel stopped it because she never felt she was depressed.  We'll see if this medication will not only help her pain but her mood.  We'll stop the Lyrica which may be  contributing to her elevated blood sugars, weight gain and edema.  She is take Zanaflex at bedtime and the Percocet up to BID if needed.     - tiZANidine (ZANAFLEX) 4 MG tablet; Take 1 tablet (4 mg total) by mouth at bedtime. -  oxyCODONE-acetaminophen (PERCOCET) 10-325 MG per tablet; Take 1 tab po BID as needed for pain - DULoxetine (CYMBALTA) 60 MG capsule; Take 1 capsule (60 mg total) by mouth at bedtime.  Type II or unspecified type diabetes mellitus without mention of complication, uncontrolled Comments: Tressia's A1c was 5.9% before the events of the past several months. I think we can get her back on track with diet, exercise and other medications. She took Byetta for years and did fine with it.  We'll try a combination of Victoza, Glucotrol XL and Precose to hopefully help get some weight off.  Her family is to get her a copy of Dr. Fara Olden Fuhrman's book "The End of Diabetes".    - POCT HgB A1C - Liraglutide 18 MG/3ML SOPN; Start with .6 mg at bedtime for 1 week then increase to 1.2 mg for one week then increase to 1.8 - acarbose (PRECOSE) 50 MG tablet; Take 1 tablet (50 mg total) by mouth 3 (three) times daily with meals. - glipiZIDE (GLUCOTROL XL) 5 MG 24 hr tablet; Take 1 tablet (5 mg total) by mouth daily with breakfast. -      Insulin Pen Needle 31G X 8 MM MISC; 1 pen by Does not apply route at bedtime.  Edema Comments: She has a lot of edema in her lower extremities.  We'll add some Zaroxyln for the next week which will hopefully help her leg pain.  We'll check labs next week when she comes in.     - metolazone (ZAROXOLYN) 5 MG tablet; Take 1 tablet (5 mg total) by mouth every morning. - furosemide (LASIX) 40 MG tablet; Take 1 tablet (  40 mg total) by mouth daily at 12 noon. - potassium chloride SA (K-DUR,KLOR-CON) 20 MEQ tablet; Take 1 tablet (20 mEq total) by mouth 2 (two) times daily.  Pain of right lower extremity Comments: Ryana was recently changed to Lyrica which she says that  she does not like.  She says that she does take her narcotic pain medication occasionally. This will continue for her to take up to BID.  Nafisah is VERY deconditioned.  We have got to get her up and out of the bed if she is ever going to get home.  She was encouraged to do exercises as much as she can.  I showed Glorian and her family some DVDs you can do in a chair.    TIME SPENT "FACE TO FACE" WITH PATIENT -  120  MINS

## 2014-04-02 ENCOUNTER — Ambulatory Visit: Payer: Medicare Other | Admitting: Family Medicine

## 2014-04-05 ENCOUNTER — Inpatient Hospital Stay (HOSPITAL_COMMUNITY): Payer: Medicare Other

## 2014-04-05 ENCOUNTER — Inpatient Hospital Stay (HOSPITAL_COMMUNITY)
Admission: EM | Admit: 2014-04-05 | Discharge: 2014-04-10 | DRG: 682 | Disposition: A | Discharge status: Skilled Nursing Facility | Payer: Medicare Other | Attending: Internal Medicine | Admitting: Internal Medicine

## 2014-04-05 ENCOUNTER — Emergency Department (HOSPITAL_COMMUNITY): Payer: Medicare Other

## 2014-04-05 ENCOUNTER — Encounter (HOSPITAL_COMMUNITY): Payer: Self-pay | Admitting: Emergency Medicine

## 2014-04-05 DIAGNOSIS — G9340 Encephalopathy, unspecified: Secondary | ICD-10-CM

## 2014-04-05 DIAGNOSIS — I10 Essential (primary) hypertension: Secondary | ICD-10-CM | POA: Diagnosis present

## 2014-04-05 DIAGNOSIS — Z9089 Acquired absence of other organs: Secondary | ICD-10-CM

## 2014-04-05 DIAGNOSIS — Z515 Encounter for palliative care: Secondary | ICD-10-CM

## 2014-04-05 DIAGNOSIS — I2699 Other pulmonary embolism without acute cor pulmonale: Secondary | ICD-10-CM

## 2014-04-05 DIAGNOSIS — G609 Hereditary and idiopathic neuropathy, unspecified: Secondary | ICD-10-CM | POA: Diagnosis present

## 2014-04-05 DIAGNOSIS — J9601 Acute respiratory failure with hypoxia: Secondary | ICD-10-CM

## 2014-04-05 DIAGNOSIS — G92 Toxic encephalopathy: Secondary | ICD-10-CM | POA: Diagnosis present

## 2014-04-05 DIAGNOSIS — E039 Hypothyroidism, unspecified: Secondary | ICD-10-CM | POA: Diagnosis present

## 2014-04-05 DIAGNOSIS — E860 Dehydration: Secondary | ICD-10-CM | POA: Diagnosis present

## 2014-04-05 DIAGNOSIS — Z794 Long term (current) use of insulin: Secondary | ICD-10-CM | POA: Diagnosis not present

## 2014-04-05 DIAGNOSIS — T481X5A Adverse effect of skeletal muscle relaxants [neuromuscular blocking agents], initial encounter: Secondary | ICD-10-CM | POA: Diagnosis present

## 2014-04-05 DIAGNOSIS — M109 Gout, unspecified: Secondary | ICD-10-CM | POA: Diagnosis present

## 2014-04-05 DIAGNOSIS — Z7901 Long term (current) use of anticoagulants: Secondary | ICD-10-CM

## 2014-04-05 DIAGNOSIS — G929 Unspecified toxic encephalopathy: Secondary | ICD-10-CM | POA: Diagnosis present

## 2014-04-05 DIAGNOSIS — G629 Polyneuropathy, unspecified: Secondary | ICD-10-CM

## 2014-04-05 DIAGNOSIS — Z833 Family history of diabetes mellitus: Secondary | ICD-10-CM

## 2014-04-05 DIAGNOSIS — K219 Gastro-esophageal reflux disease without esophagitis: Secondary | ICD-10-CM | POA: Diagnosis present

## 2014-04-05 DIAGNOSIS — R0902 Hypoxemia: Secondary | ICD-10-CM | POA: Diagnosis present

## 2014-04-05 DIAGNOSIS — J4489 Other specified chronic obstructive pulmonary disease: Secondary | ICD-10-CM | POA: Diagnosis present

## 2014-04-05 DIAGNOSIS — Z86718 Personal history of other venous thrombosis and embolism: Secondary | ICD-10-CM | POA: Diagnosis not present

## 2014-04-05 DIAGNOSIS — M79604 Pain in right leg: Secondary | ICD-10-CM

## 2014-04-05 DIAGNOSIS — M8580 Other specified disorders of bone density and structure, unspecified site: Secondary | ICD-10-CM

## 2014-04-05 DIAGNOSIS — Z860100 Personal history of colon polyps, unspecified: Secondary | ICD-10-CM

## 2014-04-05 DIAGNOSIS — Z888 Allergy status to other drugs, medicaments and biological substances status: Secondary | ICD-10-CM

## 2014-04-05 DIAGNOSIS — Z96659 Presence of unspecified artificial knee joint: Secondary | ICD-10-CM | POA: Diagnosis not present

## 2014-04-05 DIAGNOSIS — R269 Unspecified abnormalities of gait and mobility: Secondary | ICD-10-CM

## 2014-04-05 DIAGNOSIS — D649 Anemia, unspecified: Secondary | ICD-10-CM | POA: Diagnosis present

## 2014-04-05 DIAGNOSIS — Z9849 Cataract extraction status, unspecified eye: Secondary | ICD-10-CM

## 2014-04-05 DIAGNOSIS — K635 Polyp of colon: Secondary | ICD-10-CM

## 2014-04-05 DIAGNOSIS — Z803 Family history of malignant neoplasm of breast: Secondary | ICD-10-CM | POA: Diagnosis not present

## 2014-04-05 DIAGNOSIS — Z86711 Personal history of pulmonary embolism: Secondary | ICD-10-CM | POA: Diagnosis not present

## 2014-04-05 DIAGNOSIS — M5116 Intervertebral disc disorders with radiculopathy, lumbar region: Secondary | ICD-10-CM

## 2014-04-05 DIAGNOSIS — R4182 Altered mental status, unspecified: Secondary | ICD-10-CM | POA: Insufficient documentation

## 2014-04-05 DIAGNOSIS — M519 Unspecified thoracic, thoracolumbar and lumbosacral intervertebral disc disorder: Secondary | ICD-10-CM

## 2014-04-05 DIAGNOSIS — Z79899 Other long term (current) drug therapy: Secondary | ICD-10-CM | POA: Diagnosis not present

## 2014-04-05 DIAGNOSIS — E46 Unspecified protein-calorie malnutrition: Secondary | ICD-10-CM | POA: Diagnosis present

## 2014-04-05 DIAGNOSIS — M5126 Other intervertebral disc displacement, lumbar region: Secondary | ICD-10-CM

## 2014-04-05 DIAGNOSIS — Z8739 Personal history of other diseases of the musculoskeletal system and connective tissue: Secondary | ICD-10-CM

## 2014-04-05 DIAGNOSIS — R0602 Shortness of breath: Secondary | ICD-10-CM

## 2014-04-05 DIAGNOSIS — R110 Nausea: Secondary | ICD-10-CM

## 2014-04-05 DIAGNOSIS — N179 Acute kidney failure, unspecified: Secondary | ICD-10-CM

## 2014-04-05 DIAGNOSIS — T4275XA Adverse effect of unspecified antiepileptic and sedative-hypnotic drugs, initial encounter: Secondary | ICD-10-CM | POA: Diagnosis present

## 2014-04-05 DIAGNOSIS — N281 Cyst of kidney, acquired: Secondary | ICD-10-CM

## 2014-04-05 DIAGNOSIS — E119 Type 2 diabetes mellitus without complications: Secondary | ICD-10-CM

## 2014-04-05 DIAGNOSIS — J449 Chronic obstructive pulmonary disease, unspecified: Secondary | ICD-10-CM | POA: Diagnosis present

## 2014-04-05 DIAGNOSIS — C799 Secondary malignant neoplasm of unspecified site: Secondary | ICD-10-CM

## 2014-04-05 DIAGNOSIS — R579 Shock, unspecified: Secondary | ICD-10-CM

## 2014-04-05 DIAGNOSIS — E785 Hyperlipidemia, unspecified: Secondary | ICD-10-CM

## 2014-04-05 DIAGNOSIS — M549 Dorsalgia, unspecified: Secondary | ICD-10-CM

## 2014-04-05 DIAGNOSIS — K449 Diaphragmatic hernia without obstruction or gangrene: Secondary | ICD-10-CM

## 2014-04-05 DIAGNOSIS — M25561 Pain in right knee: Secondary | ICD-10-CM

## 2014-04-05 LAB — COMPREHENSIVE METABOLIC PANEL
ALK PHOS: 66 U/L (ref 39–117)
ALT: 15 U/L (ref 0–35)
AST: 20 U/L (ref 0–37)
Albumin: 3.3 g/dL — ABNORMAL LOW (ref 3.5–5.2)
Anion gap: 14 (ref 5–15)
BUN: 55 mg/dL — ABNORMAL HIGH (ref 6–23)
CALCIUM: 8.4 mg/dL (ref 8.4–10.5)
CO2: 26 meq/L (ref 19–32)
Chloride: 91 mEq/L — ABNORMAL LOW (ref 96–112)
Creatinine, Ser: 2.32 mg/dL — ABNORMAL HIGH (ref 0.50–1.10)
GFR calc non Af Amer: 18 mL/min — ABNORMAL LOW (ref >90)
GFR, EST AFRICAN AMERICAN: 21 mL/min — AB (ref >90)
GLUCOSE: 203 mg/dL — AB (ref 70–99)
Potassium: 5.5 mEq/L — ABNORMAL HIGH (ref 3.7–5.3)
SODIUM: 131 meq/L — AB (ref 137–147)
Total Bilirubin: 0.3 mg/dL (ref 0.3–1.2)
Total Protein: 6.3 g/dL (ref 6.0–8.3)

## 2014-04-05 LAB — URINE MICROSCOPIC-ADD ON

## 2014-04-05 LAB — PROTIME-INR
INR: 2.26 — ABNORMAL HIGH (ref 0.00–1.49)
Prothrombin Time: 25 seconds — ABNORMAL HIGH (ref 11.6–15.2)

## 2014-04-05 LAB — URINALYSIS, ROUTINE W REFLEX MICROSCOPIC
BILIRUBIN URINE: NEGATIVE
GLUCOSE, UA: NEGATIVE mg/dL
HGB URINE DIPSTICK: NEGATIVE
Ketones, ur: NEGATIVE mg/dL
Nitrite: NEGATIVE
PH: 5 (ref 5.0–8.0)
Protein, ur: NEGATIVE mg/dL
Specific Gravity, Urine: 1.017 (ref 1.005–1.030)
Urobilinogen, UA: 0.2 mg/dL (ref 0.0–1.0)

## 2014-04-05 LAB — CBC WITH DIFFERENTIAL/PLATELET
Basophils Absolute: 0 10*3/uL (ref 0.0–0.1)
Basophils Relative: 0 % (ref 0–1)
EOS PCT: 1 % (ref 0–5)
Eosinophils Absolute: 0.1 10*3/uL (ref 0.0–0.7)
HEMATOCRIT: 35.4 % — AB (ref 36.0–46.0)
Hemoglobin: 11.1 g/dL — ABNORMAL LOW (ref 12.0–15.0)
LYMPHS PCT: 34 % (ref 12–46)
Lymphs Abs: 3.8 10*3/uL (ref 0.7–4.0)
MCH: 29 pg (ref 26.0–34.0)
MCHC: 31.4 g/dL (ref 30.0–36.0)
MCV: 92.4 fL (ref 78.0–100.0)
MONO ABS: 0.9 10*3/uL (ref 0.1–1.0)
Monocytes Relative: 8 % (ref 3–12)
Neutro Abs: 6.5 10*3/uL (ref 1.7–7.7)
Neutrophils Relative %: 57 % (ref 43–77)
PLATELETS: 318 10*3/uL (ref 150–400)
RBC: 3.83 MIL/uL — AB (ref 3.87–5.11)
RDW: 15.7 % — ABNORMAL HIGH (ref 11.5–15.5)
WBC: 11.4 10*3/uL — AB (ref 4.0–10.5)

## 2014-04-05 LAB — MRSA PCR SCREENING: MRSA BY PCR: NEGATIVE

## 2014-04-05 LAB — AMMONIA: Ammonia: 17 umol/L (ref 11–60)

## 2014-04-05 LAB — PRO B NATRIURETIC PEPTIDE: Pro B Natriuretic peptide (BNP): 711.3 pg/mL — ABNORMAL HIGH (ref 0–450)

## 2014-04-05 LAB — I-STAT CG4 LACTIC ACID, ED: LACTIC ACID, VENOUS: 1.69 mmol/L (ref 0.5–2.2)

## 2014-04-05 LAB — GLUCOSE, CAPILLARY: GLUCOSE-CAPILLARY: 174 mg/dL — AB (ref 70–99)

## 2014-04-05 LAB — HEPARIN LEVEL (UNFRACTIONATED): Heparin Unfractionated: >2.2 IU/mL — ABNORMAL HIGH (ref 0.30–0.70)

## 2014-04-05 LAB — TROPONIN I: Troponin I: <0.3 ng/mL (ref <0.30)

## 2014-04-05 LAB — APTT: APTT: 32 s (ref 24–37)

## 2014-04-05 MED ORDER — LEVOTHYROXINE SODIUM 112 MCG PO TABS
112.0000 ug | ORAL_TABLET | Freq: Every day | ORAL | Status: DC
  Administered 2014-04-06 – 2014-04-10 (×5): 112 ug via ORAL
  Filled 2014-04-05 (×6): qty 1

## 2014-04-05 MED ORDER — ALLOPURINOL 300 MG PO TABS
300.0000 mg | ORAL_TABLET | Freq: Every day | ORAL | Status: DC
  Administered 2014-04-06 – 2014-04-10 (×6): 300 mg via ORAL
  Filled 2014-04-05 (×6): qty 1

## 2014-04-05 MED ORDER — LIDOCAINE 5 % EX PTCH
2.0000 | MEDICATED_PATCH | CUTANEOUS | Status: DC
Start: 2014-04-05 — End: 2014-04-10
  Administered 2014-04-06 – 2014-04-09 (×4): 2 via TRANSDERMAL
  Filled 2014-04-05 (×6): qty 2

## 2014-04-05 MED ORDER — ONDANSETRON 8 MG PO TBDP
8.0000 mg | ORAL_TABLET | Freq: Three times a day (TID) | ORAL | Status: DC
  Filled 2014-04-05 (×16): qty 1

## 2014-04-05 MED ORDER — TECHNETIUM TC 99M DIETHYLENETRIAME-PENTAACETIC ACID: 40.0000 | Freq: Once | INTRAVENOUS | Status: AC | PRN

## 2014-04-05 MED ORDER — TECHNETIUM TO 99M ALBUMIN AGGREGATED
6.0000 | Freq: Once | INTRAVENOUS | Status: AC | PRN
  Administered 2014-04-05: 6 via INTRAVENOUS

## 2014-04-05 MED ORDER — PANTOPRAZOLE SODIUM 40 MG PO TBEC
40.0000 mg | DELAYED_RELEASE_TABLET | Freq: Every day | ORAL | Status: DC
  Administered 2014-04-06 – 2014-04-10 (×6): 40 mg via ORAL
  Filled 2014-04-05 (×6): qty 1

## 2014-04-05 MED ORDER — SODIUM CHLORIDE 0.9 % IJ SOLN
3.0000 mL | Freq: Two times a day (BID) | INTRAMUSCULAR | Status: DC
  Administered 2014-04-06 – 2014-04-10 (×7): 3 mL via INTRAVENOUS

## 2014-04-05 MED ORDER — ACETAMINOPHEN 325 MG PO TABS
650.0000 mg | ORAL_TABLET | Freq: Once | ORAL | Status: AC
  Administered 2014-04-05: 650 mg via ORAL
  Filled 2014-04-05: qty 2

## 2014-04-05 MED ORDER — ACETAMINOPHEN 325 MG PO TABS: 650.0000 mg | ORAL_TABLET | ORAL | Status: DC | PRN

## 2014-04-05 MED ORDER — GABAPENTIN 300 MG PO CAPS
300.0000 mg | ORAL_CAPSULE | Freq: Every day | ORAL | Status: DC
  Administered 2014-04-06 – 2014-04-09 (×4): 300 mg via ORAL
  Filled 2014-04-05 (×5): qty 1

## 2014-04-05 MED ORDER — OXYCODONE HCL 5 MG PO TABS: 5.0000 mg | ORAL_TABLET | Freq: Three times a day (TID) | ORAL | Status: DC | PRN

## 2014-04-05 MED ORDER — POLYETHYLENE GLYCOL 3350 17 G PO PACK
17.0000 g | PACK | Freq: Every day | ORAL | Status: DC | PRN
  Administered 2014-04-06 – 2014-04-09 (×2): 17 g via ORAL
  Filled 2014-04-05 (×2): qty 1

## 2014-04-05 NOTE — ED Notes (Signed)
Nuclear Medicine called to inform staff that someone will be coming in to perform VQ scan in a couple of hours.

## 2014-04-05 NOTE — ED Notes (Signed)
Pt in with family reporting that over the last week she moved to an assisted living facility, today family noted increased confusion, EMS was called and patient O2 levels were 85% on their arrival, pt refused transport, pt alert and oriented at this time, denies pain

## 2014-04-05 NOTE — ED Notes (Signed)
NOTIFIED DR. GOLDSTON FOR PATIENTS LAB RESULTS OF CG4+LACTIC ACID ,@17 :50 PM .04/05/2014.

## 2014-04-05 NOTE — ED Notes (Signed)
Dr. Goldston at the bedside. 

## 2014-04-05 NOTE — ED Notes (Signed)
Dr. Goldston at bedside.  

## 2014-04-05 NOTE — ED Provider Notes (Signed)
CSN: [id number]     Arrival date & time 04/05/14  1647 History   First MD Initiated Contact with Patient 04/05/14 1658     Chief Complaint  Patient presents with  . Altered Mental Status     (Consider location/radiation/quality/duration/timing/severity/associated sxs/prior Treatment) HPI 80 year old female presents with family for altered mental status since yesterday. The patient recently moved into an assisted-living facility, prior to this she was in a rehabilitation facility. The patient started becoming disoriented last night. The patient seems to know where she is, what day it is, and who she is but seems to speak out of her head, suggesting that she is going to [facility] or beating up someone. This is atypical for her. Patient was recently discharged after having back surgery. This is complicated by having a pulmonary embolism prior to this. She's currently on Xarelto. EMS was called today as the patient was having confusion and her oxygen saturation are 85% on room air. At that time the patient refused transport but the family b urine. The patient denies any current complaints besides diffuse swelling, worst in her legs but also in her hands. This is new since discharge. No fevers, cough, dyspnea, or chest pain. Has had intermittent headaches. She has had right leg weakness has been going on for a couple weeks and seems to be worsening.  Past Medical History  Diagnosis Date  . DM type 2 (diabetes mellitus, type 2)   . Hyperlipemia   . Hypertension   . Osteopenia     Lumbar spine  . Hx of osteoarthritis     bilateral knee osteoarthritis  -   bilateral knee replacement  . Colon polyps     Tubular Adenoma  . Diverticulosis     patient unaware   . Peripheral neuropathy   . Hiatal hernia   . Heart murmur     no need to follow  . GERD (gastroesophageal reflux disease)     not on meds, tums prn  . Arthritis   . Hypothyroidism   . Gout   . DVT (deep venous thrombosis) 12-31-13  .  Pulmonary embolus 12-31-13   Past Surgical History  Procedure Laterality Date  . Abdominal hysterectomy      excessive bleeding (IUD)  . Cataract extraction Right   . Cholecystectomy    . Cardiac catheterization  1995    [facility] - clean  . Eye surgery Right     Cataract  . Knee arthroplasty Bilateral   . Tonsillectomy    . Carpal tunnel release Right   . Colonoscopy    . Esophagogastroduodenoscopy (egd) with esophageal dilation    . Joint replacement Bilateral     knees  . Lumbar laminectomy/decompression microdiscectomy N/A 02/16/2014    Procedure: Left Lumbar two/three Microdiskectomy, Right Lumbar four/five decompression lumbar laminectomy  ;  Surgeon: Charlie Pitter, MD;  Location: [facility];  Service: Neurosurgery;  Laterality: N/A;  . Spine surgery     Family History  Problem Relation Age of Onset  . Vascular Disease Father     Cardio  . Diabetes type II Other   . Diabetes type II Other   . Breast cancer Paternal Aunt    History  Substance Use Topics  . Smoking status: Never Smoker   . Smokeless tobacco: Never Used  . Alcohol Use: No   OB History   Grav Para Term Preterm Abortions TAB SAB Ect Mult Living  Review of Systems  Constitutional: Negative for fever.  Respiratory: Negative for cough and shortness of breath.   Cardiovascular: Positive for leg swelling. Negative for chest pain.  Gastrointestinal: Negative for vomiting and abdominal pain.  Genitourinary: Negative for dysuria.  Neurological: Positive for weakness. Negative for numbness.  All other systems reviewed and are negative.     Allergies  Ace inhibitors; Byetta 10 mcg pen; and Beta adrenergic blockers  Home Medications   Prior to Admission medications   Medication Sig Start Date End Date Taking? Authorizing Provider  acarbose (PRECOSE) 50 MG tablet Take 1 tablet (50 mg total) by mouth 3 (three) times daily with meals. 03/30/14 03/31/15  Jonathon Resides, MD  acetaminophen (MAPAP)  325 MG tablet Take 650 mg by mouth every 4 (four) hours as needed.    Historical Provider, MD  allopurinol (ZYLOPRIM) 300 MG tablet Take 1 tablet (300 mg total) by mouth daily. 07/19/13 07/19/14  Jonathon Resides, MD  alum & mag hydroxide-simeth (MI-ACID) 200-200-20 MG/5ML suspension Take 10 mLs by mouth every 6 (six) hours as needed for indigestion or heartburn.    Historical Provider, MD  amLODipine (NORVASC) 10 MG tablet Take 1 tablet (10 mg total) by mouth daily. 01/05/14   Janece Canterbury, MD  Bacillus Coagulans-Inulin (PROBIOTIC FORMULA) 1-250 BILLION-MG CAPS Take 1 capsule by mouth daily.    Historical Provider, MD  Canagliflozin (INVOKANA) 300 MG TABS Take 1 tablet (300 mg total) by mouth daily. 03/30/14 03/31/15  Jonathon Resides, MD  clotrimazole-betamethasone (LOTRISONE) cream Apply 1 application topically 2 (two) times daily.    Historical Provider, MD  dexlansoprazole (DEXILANT) 60 MG capsule Take 1 capsule (60 mg total) by mouth daily. 02/26/14 02/27/15  Jonathon Resides, MD  DULoxetine (CYMBALTA) 60 MG capsule Take 1 capsule (60 mg total) by mouth at bedtime. 03/30/14 03/31/15  Jonathon Resides, MD  Fe Fum-FA-B Cmp-C-Zn-Mg-Mn-Cu (HEMOCYTE PLUS) 106-1 MG CAPS Take 1 capsule by mouth daily.    Historical Provider, MD  furosemide (LASIX) 40 MG tablet Take 1 tablet (40 mg total) by mouth daily at 12 noon. 03/30/14 03/31/15  Jonathon Resides, MD  gabapentin (NEURONTIN) 300 MG capsule Take 1 capsule (300 mg total) by mouth 2 (two) times daily. 01/05/14   Janece Canterbury, MD  glipiZIDE (GLUCOTROL XL) 5 MG 24 hr tablet Take 1 tablet (5 mg total) by mouth daily with breakfast. 03/30/14 03/31/15  Jonathon Resides, MD  insulin aspart (NOVOLOG) 100 UNIT/ML injection Inject 10 Units into the skin 3 (three) times daily before meals.     Historical Provider, MD  insulin detemir (LEVEMIR) 100 UNIT/ML injection Inject 20 Units into the skin at bedtime.    Historical Provider, MD  Insulin Pen Needle 31G X 8 MM MISC 1 pen by Does  not apply route at bedtime. 03/30/14 03/31/15  Jonathon Resides, MD  irbesartan (AVAPRO) 300 MG tablet Take 1 tablet (300 mg total) by mouth daily. 03/30/14 03/31/15  Jonathon Resides, MD  levothyroxine (SYNTHROID, LEVOTHROID) 112 MCG tablet Take 1 tablet (112 mcg total) by mouth daily. 11/02/13 11/02/14  Jonathon Resides, MD  Liraglutide 18 MG/3ML SOPN Start with .6 mg at bedtime for 1 week then increase to 1.2 mg for one week then increase to 1.8 03/30/14 03/31/15  Jonathon Resides, MD  magnesium hydroxide (MILK OF MAGNESIA) 400 MG/5ML suspension Take 30 mLs by mouth daily as needed for mild constipation.    Historical Provider, MD  metolazone (ZAROXOLYN) 5  MG tablet Take 1 tablet (5 mg total) by mouth every morning. 03/30/14 03/31/15  Jonathon Resides, MD  ondansetron (ZOFRAN-ODT) 8 MG disintegrating tablet Take 8 mg by mouth every 8 (eight) hours as needed for nausea.  11/16/13 11/17/14  Jonathon Resides, MD  oxyCODONE (OXY IR/ROXICODONE) 5 MG immediate release tablet Take 1-2 tablets (5-10 mg total) by mouth every 3 (three) hours as needed for moderate pain. 02/19/14   Charlie Pitter, MD  oxyCODONE-acetaminophen (PERCOCET) 10-325 MG per tablet Take 1 tab po BID as needed for pain 03/30/14 03/31/15  Jonathon Resides, MD  polyethylene glycol (MIRALAX / GLYCOLAX) packet Take 17 g by mouth daily as needed for mild constipation. 01/05/14   Janece Canterbury, MD  potassium chloride SA (K-DUR,KLOR-CON) 20 MEQ tablet Take 1 tablet (20 mEq total) by mouth 2 (two) times daily. 03/30/14 03/31/15  Jonathon Resides, MD  promethazine (PHENERGAN) 12.5 MG tablet Take 12.5 mg by mouth every 6 (six) hours as needed for nausea.  10/23/13 10/23/14  Jonathon Resides, MD  rivaroxaban (XARELTO) 20 MG TABS tablet Take 20 mg by mouth daily with supper.    Historical Provider, MD  tiZANidine (ZANAFLEX) 4 MG tablet Take 1 tablet (4 mg total) by mouth at bedtime. 03/30/14 03/31/15  Jonathon Resides, MD   BP 138/46  Pulse 91  Temp(Src) 98.6 F (37 C) (Oral)  Resp 15   SpO2 93% Physical Exam  Nursing note and vitals reviewed. Constitutional: She is oriented to person, place, and time. She appears well-developed and well-nourished.  HENT:  Head: Normocephalic and atraumatic.  Right Ear: External ear normal.  Left Ear: External ear normal.  Nose: Nose normal.  Eyes: EOM are normal. Pupils are equal, round, and reactive to light. Right eye exhibits no discharge. Left eye exhibits no discharge.  Cardiovascular: Normal rate and regular rhythm.   Murmur heard. Pulmonary/Chest: Effort normal and breath sounds normal. She has no wheezes.  Abdominal: Soft. There is no tenderness.  Musculoskeletal: She exhibits edema (biilateral pitting edema to lower legs. Mild swelling to bilateral hands).  Neurological: She is alert and oriented to person, place, and time.  CN 2-12 grossly intact. Alert, oriented to person, place and time. R leg unable to lift off bed. Other extremities with normal strength.  Skin: Skin is warm and dry.    ED Course  Procedures (including critical care time) Labs Review Labs Reviewed  CBC WITH DIFFERENTIAL - Abnormal; Notable for the following:    WBC 11.4 (*)    RBC 3.83 (*)    Hemoglobin 11.1 (*)    HCT 35.4 (*)    RDW 15.7 (*)    All other components within normal limits  COMPREHENSIVE METABOLIC PANEL - Abnormal; Notable for the following:    Sodium 131 (*)    Potassium 5.5 (*)    Chloride 91 (*)    Glucose, Bld 203 (*)    BUN 55 (*)    Creatinine, Ser 2.32 (*)    Albumin 3.3 (*)    GFR calc non Af Amer 18 (*)    GFR calc Af Amer 21 (*)    All other components within normal limits  URINALYSIS, ROUTINE W REFLEX MICROSCOPIC - Abnormal; Notable for the following:    APPearance HAZY (*)    Leukocytes, UA MODERATE (*)    All other components within normal limits  PRO B NATRIURETIC PEPTIDE - Abnormal; Notable for the following:    Pro B Natriuretic peptide (  BNP) 711.3 (*)    All other components within normal limits  URINE  MICROSCOPIC-ADD ON - Abnormal; Notable for the following:    Squamous Epithelial / LPF FEW (*)    Casts HYALINE CASTS (*)    All other components within normal limits  GLUCOSE, CAPILLARY - Abnormal; Notable for the following:    Glucose-Capillary 174 (*)    All other components within normal limits  PROTIME-INR - Abnormal; Notable for the following:    Prothrombin Time 25.0 (*)    INR 2.26 (*)    All other components within normal limits  HEPARIN LEVEL (UNFRACTIONATED) - Abnormal; Notable for the following:    Heparin Unfractionated >2.20 (*)    All other components within normal limits  MRSA PCR SCREENING  AMMONIA  TROPONIN I  APTT  SODIUM, URINE, RANDOM  CREATININE, URINE, RANDOM  COMPREHENSIVE METABOLIC PANEL  CBC WITH DIFFERENTIAL  TSH  URINE RAPID DRUG SCREEN (HOSP PERFORMED)  HEPARIN LEVEL (UNFRACTIONATED)  CBC  APTT  I-STAT CG4 LACTIC ACID, ED    Imaging Review Dg Chest 2 View  04/05/2014   CLINICAL DATA:  Altered mental status.  EXAM: CHEST  2 VIEW  COMPARISON:  CT.  08/2014.  Chest x-ray 01/01/2014.  FINDINGS: Mediastinum and hilar structures stable. Stable right pulmonary artery prominence. Lungs are clear. Heart size normal. No pleural effusion or pneumothorax. No acute osseus abnormality.  IMPRESSION: No active cardiopulmonary disease.   Electronically Signed   By: [place]   On: 04/05/2014 18:07   Ct Head Wo Contrast  04/05/2014   CLINICAL DATA:  Altered mental status.  EXAM: CT HEAD WITHOUT CONTRAST  TECHNIQUE: Contiguous axial images were obtained from the base of the skull through the vertex without intravenous contrast.  COMPARISON:  PET-CT 01/10/2014.  FINDINGS: No mass. No hydrocephalus. No hemorrhage. White matter changes consistent with chronic ischemia. Dural type calcification noted along the right cerebellar hemisphere. This was slightly identified on prior PET CT. No acute bony abnormality. Mucous retention cyst both maxillary sinuses.  IMPRESSION:  White matter changes consistent chronic ischemia. No acute abnormality.   Electronically Signed   By: Marcello Moores  Register   On: 04/05/2014 18:24   Nm Pulmonary Perf And Vent  04/05/2014   CLINICAL DATA:  Shortness of breath. Renal insufficiency. Unknown primary cancer.  EXAM: NUCLEAR MEDICINE VENTILATION - PERFUSION LUNG SCAN  TECHNIQUE: Ventilation images were obtained in multiple projections using inhaled aerosol technetium 99 M DTPA. Perfusion images were obtained in multiple projections after intravenous injection of Tc-51m MAA.  RADIOPHARMACEUTICALS:  40.0 MCi Tc-67m DTPA aerosol and 6.0 mCi Tc-56m MAA  COMPARISON:  Chest radiographs 04/05/2014, PET-CT 01/10/2014 and chest CT 12/31/2013.  FINDINGS: Ventilation: Ventilation images are limited by central clumping of the air cell. No focal ventilatory defects are identified, although some obstructive lung disease suspected.  Perfusion: No wedge shaped peripheral perfusion defects to suggest acute pulmonary embolism. Small peripheral perfusion defects likely represent chronic obstructive pulmonary disease. These appear matched with the ventilatory examination.  IMPRESSION: Low probability for acute pulmonary embolism. There are findings suggesting a degree of obstructive lung disease.   Electronically Signed   By: Camie Patience M.D.   On: 04/05/2014 23:54     EKG Interpretation   Date/Time:  Thursday April 05 2014 16:55:21 EDT Ventricular Rate:  83 PR Interval:  164 QRS Duration: 88 QT Interval:  352 QTC Calculation: 413 R Axis:   -31 Text Interpretation:  Normal sinus rhythm Left axis deviation Low  voltage  QRS Inferior infarct , age undetermined Cannot rule out Anterior infarct ,  age undetermined Abnormal ECG nonspecific T waves AVL is new Confirmed by  [place]  MD, Eliceo Gladu ([id number]) on 04/05/2014 4:59:07 PM      MDM   Final diagnoses:  Acute encephalopathy  Acute renal failure, unspecified acute renal failure type  SOB (shortness of breath)   Type II or unspecified type diabetes mellitus without mention of complication, not stated as uncontrolled    Patient with mild confusion, new from baseline. Workup shows no obvious cause, though does show AKI. Given her new hypoxia, this could be contributing. Will need V/Q scan to r/o PE given AKI and history of PE. Otherwise will admit to hospitalist    Ephraim Hamburger, MD 04/06/14 0110

## 2014-04-05 NOTE — H&P (Signed)
[facility] History and Physical  KAYSEY BERNDT EGB:[id number] DOB: [birth year] DOA: 04/05/2014  Referring physician: ER physician. PCP: Ival Bible, MD   Chief Complaint: Altered mental status.  HPI: Amber Knox is a 80 y.o. female history of pulmonary embolism, diabetes mellitus, hypertension, hypothyroidism and gout who has had surgery of the low back 2 months ago was brought to the ER from assisted living facility after patient was found to be confused. As per the patient's family patient was having some nausea earlier today and was given Phenergan. When patient became more confused and had some slurred speech and was talking out of her head. Patient was brought to the ER and was found to be confused. Patient's labs also shows acute worsening of her renal function. Patient also was found to be hypoxic with mild shortness of breath.  CT of the head was negative for any acute. Patient's family states that patient has been found to have increasing lower extremity edema and was recently placed on metolazone. Patient is already on Lasix. Patient also was being tried to be weaned off oxycodone and was placed on Celebrex. On my exam patient is able to follow commands and moves all extremities but at times patient was talking out off context. Abdomen is benign on exam. Patient is not in acute distress. Patient still has right lower extremity pain. Patient has bilateral lower extremity edema. Is able to move all extremities.  Review of Systems: As presented in the history of presenting illness, rest negative.  Past Medical History  Diagnosis Date  . DM type 2 (diabetes mellitus, type 2)   . Hyperlipemia   . Hypertension   . Osteopenia     Lumbar spine  . Hx of osteoarthritis     bilateral knee osteoarthritis  -   bilateral knee replacement  . Colon polyps     Tubular Adenoma  . Diverticulosis     patient unaware   . Peripheral neuropathy   . Hiatal hernia   . Heart murmur     no  need to follow  . GERD (gastroesophageal reflux disease)     not on meds, tums prn  . Arthritis   . Hypothyroidism   . Gout   . DVT (deep venous thrombosis) 12-31-13  . Pulmonary embolus 12-31-13   Past Surgical History  Procedure Laterality Date  . Abdominal hysterectomy      excessive bleeding (IUD)  . Cataract extraction Right   . Cholecystectomy    . Cardiac catheterization  1995    [facility] - clean  . Eye surgery Right     Cataract  . Knee arthroplasty Bilateral   . Tonsillectomy    . Carpal tunnel release Right   . Colonoscopy    . Esophagogastroduodenoscopy (egd) with esophageal dilation    . Joint replacement Bilateral     knees  . Lumbar laminectomy/decompression microdiscectomy N/A 02/16/2014    Procedure: Left Lumbar two/three Microdiskectomy, Right Lumbar four/five decompression lumbar laminectomy  ;  Surgeon: Charlie Pitter, MD;  Location: [facility];  Service: Neurosurgery;  Laterality: N/A;  . Spine surgery     Social History:  reports that she has never smoked. She has never used smokeless tobacco. She reports that she does not drink alcohol or use illicit drugs. Where does patient live assisted living facility. Can patient participate in ADLs? Not sure.  Allergies  Allergen Reactions  . Ace Inhibitors Cough  . Byetta 10 Mcg Pen [Exenatide] Other (See Comments)  Pancreatitis 02/2005  . Beta Adrenergic Blockers Swelling    Tongue swelling     Family History:  Family History  Problem Relation Age of Onset  . Vascular Disease Father     Cardio  . Diabetes type II Other   . Diabetes type II Other   . Breast cancer Paternal Aunt       Prior to Admission medications   Medication Sig Start Date End Date Taking? Authorizing Provider  acetaminophen (TYLENOL) 325 MG tablet Take 650 mg by mouth 3 (three) times daily.   Yes Historical Provider, MD  allopurinol (ZYLOPRIM) 300 MG tablet Take 1 tablet (300 mg total) by mouth daily. 07/19/13 07/19/14 Yes Jonathon Resides, MD  alum & mag hydroxide-simeth (MI-ACID) 200-200-20 MG/5ML suspension Take 10 mLs by mouth every 6 (six) hours as needed for indigestion or heartburn.   Yes Historical Provider, MD  celecoxib (CELEBREX) 200 MG capsule Take 200 mg by mouth daily.   Yes Historical Provider, MD  dexlansoprazole (DEXILANT) 60 MG capsule Take 1 capsule (60 mg total) by mouth daily. 02/26/14 02/27/15 Yes Jonathon Resides, MD  Fe Fum-FA-B Cmp-C-Zn-Mg-Mn-Cu (HEMOCYTE PLUS) 106-1 MG CAPS Take 1 capsule by mouth daily.   Yes Historical Provider, MD  furosemide (LASIX) 40 MG tablet Take 1 tablet (40 mg total) by mouth daily at 12 noon. 03/30/14 03/31/15 Yes Jonathon Resides, MD  gabapentin (NEURONTIN) 300 MG capsule Take 1 capsule (300 mg total) by mouth 2 (two) times daily. 01/05/14  Yes Janece Canterbury, MD  irbesartan (AVAPRO) 300 MG tablet Take 1 tablet (300 mg total) by mouth daily. 03/30/14 03/31/15 Yes Jonathon Resides, MD  levothyroxine (SYNTHROID, LEVOTHROID) 112 MCG tablet Take 1 tablet (112 mcg total) by mouth daily. 11/02/13 11/02/14 Yes Jonathon Resides, MD  lidocaine (LIDODERM) 5 % Place 2 patches onto the skin daily. Remove & Discard patch within 12 hours or as directed by MD   Yes Historical Provider, MD  Liraglutide 18 MG/3ML SOPN Inject 0.6 mg into the skin at bedtime.   Yes Historical Provider, MD  magnesium hydroxide (MILK OF MAGNESIA) 400 MG/5ML suspension Take 30 mLs by mouth daily as needed for mild constipation.   Yes Historical Provider, MD  metolazone (ZAROXOLYN) 5 MG tablet Take 1 tablet (5 mg total) by mouth every morning. 03/30/14 03/31/15 Yes Jonathon Resides, MD  nateglinide (STARLIX) 120 MG tablet Take 120 mg by mouth 3 (three) times daily with meals.   Yes Historical Provider, MD  ondansetron (ZOFRAN-ODT) 8 MG disintegrating tablet Take 8 mg by mouth 3 (three) times daily before meals.  11/16/13 11/17/14 Yes Jonathon Resides, MD  oxyCODONE (OXY IR/ROXICODONE) 5 MG immediate release tablet Take 5 mg by mouth See  admin instructions. Take one (1) tablet by mouth at bedtime and once a day as needed for pain.   Yes Historical Provider, MD  polyethylene glycol (MIRALAX / GLYCOLAX) packet Take 17 g by mouth daily as needed for mild constipation. 01/05/14  Yes Janece Canterbury, MD  potassium chloride SA (K-DUR,KLOR-CON) 20 MEQ tablet Take 1 tablet (20 mEq total) by mouth 2 (two) times daily. 03/30/14 03/31/15 Yes Jonathon Resides, MD  promethazine (PHENERGAN) 12.5 MG tablet Take 12.5 mg by mouth every 6 (six) hours as needed for nausea.  10/23/13 10/23/14 Yes Jonathon Resides, MD  rivaroxaban (XARELTO) 20 MG TABS tablet Take 20 mg by mouth daily with supper.   Yes Historical Provider, MD  tiZANidine (ZANAFLEX) 4 MG tablet  Take 1 tablet (4 mg total) by mouth at bedtime. 03/30/14 03/31/15 Yes Jonathon Resides, MD    Physical Exam: Filed Vitals:   04/05/14 1945 04/05/14 2000 04/05/14 2030 04/05/14 2045  BP: 152/80 132/47 125/70 105/46  Pulse: 85 78 78 78  Temp:      TempSrc:      Resp:      SpO2: 94% 94% 96% 97%     General:  Obese not in acute distress.  Eyes: Anicteric no pallor.  ENT: No discharge from the ears eyes nose mouth.  Neck: No neck rigidity no mass felt.  Cardiovascular: S1-S2 heard.  Respiratory: No rhonchi or crepitations.  Abdomen: Soft nontender bowel sounds present. No guarding rigidity.  Skin: No rash.  Musculoskeletal: Bilateral lower extremity edema.  Psychiatric: Patient is alert awake.  Neurologic: Patient is alert awake oriented to her name. Follows commands and moves all extremities. No facial asymmetry. Tongue is midline.  Labs on Admission:  Basic Metabolic Panel:  Recent Labs Lab 04/05/14 1731  NA 131*  K 5.5*  CL 91*  CO2 26  GLUCOSE 203*  BUN 55*  CREATININE 2.32*  CALCIUM 8.4   Liver Function Tests:  Recent Labs Lab 04/05/14 1731  AST 20  ALT 15  ALKPHOS 66  BILITOT 0.3  PROT 6.3  ALBUMIN 3.3*   No results found for this basename: LIPASE, AMYLASE,   in the last 168 hours  Recent Labs Lab 04/05/14 1731  AMMONIA 17   CBC:  Recent Labs Lab 04/05/14 1731  WBC 11.4*  NEUTROABS 6.5  HGB 11.1*  HCT 35.4*  MCV 92.4  PLT 318   Cardiac Enzymes:  Recent Labs Lab 04/05/14 1731  TROPONINI <0.30    BNP (last 3 results)  Recent Labs  01/02/14 0246 04/05/14 1731  PROBNP 4037.0* 711.3*   CBG: No results found for this basename: GLUCAP,  in the last 168 hours  Radiological Exams on Admission: Dg Chest 2 View  04/05/2014   CLINICAL DATA:  Altered mental status.  EXAM: CHEST  2 VIEW  COMPARISON:  CT.  08/2014.  Chest x-ray 01/01/2014.  FINDINGS: Mediastinum and hilar structures stable. Stable right pulmonary artery prominence. Lungs are clear. Heart size normal. No pleural effusion or pneumothorax. No acute osseus abnormality.  IMPRESSION: No active cardiopulmonary disease.   Electronically Signed   By: [place]   On: 04/05/2014 18:07   Ct Head Wo Contrast  04/05/2014   CLINICAL DATA:  Altered mental status.  EXAM: CT HEAD WITHOUT CONTRAST  TECHNIQUE: Contiguous axial images were obtained from the base of the skull through the vertex without intravenous contrast.  COMPARISON:  PET-CT 01/10/2014.  FINDINGS: No mass. No hydrocephalus. No hemorrhage. White matter changes consistent with chronic ischemia. Dural type calcification noted along the right cerebellar hemisphere. This was slightly identified on prior PET CT. No acute bony abnormality. Mucous retention cyst both maxillary sinuses.  IMPRESSION: White matter changes consistent chronic ischemia. No acute abnormality.   Electronically Signed   By: Marcello Moores  Register   On: 04/05/2014 18:24    EKG: Independently reviewed. Normal sinus rhythm with poor R-wave progression.  Assessment/Plan Principal Problem:   Acute encephalopathy Active Problems:   DM type 2 (diabetes mellitus, type 2)   Unspecified hypothyroidism   SOB (shortness of breath)   ARF (acute renal failure)    Pulmonary embolism   1.  acute encephalopathy - suspect most likely secondary to medications in a patient with acute renal failure.  I have decreased patient's Neurontin dose by half given the renal failure and held tizanidine. Oxycodone is made on when necessary basis rather than being scheduled. Check ammonia levels. Check MRI brain to rule out any stroke. 2. Acute renal failure - cause not clear. Patient's diuretics were recently increased and metallazone was added. Patient also was receiving Celebrex last few days. At this time I am discontinuing Zaroxolyn and Celebrex and holding of patient's diuretics and ARB. Check urine studies including urinalysis and FeNa. Closely follow intake output and metabolic panel. Check sonogram to rule out any obstruction. 3. Hypoxia - patient has history of pulmonary embolism and was recently changed to xarelto from Lovenox. Check VQ scan and Dopplers of the lower extremity for any PE or DVT. Patient presently is not in respiratory distress. Patient has been placed on heparin infusion for now given the acute renal failure.  4. History of PE - patient has been placed on heparin and xarelto is on hold given the renal failure. 5. Diabetes mellitus - patient was recently started on Starlix and Victoza. While in the hospital patient will be on sliding-scale coverage and based on CBGs be place on long-acting insulin. 6. Hypothyroidism - continue Synthroid. 7. History of gout - if patient's renal function worsens may have to decrease allopurinol dose. 8. Hypertension - since patient is off ARB and diuretics I have placed patient on when necessary IV hydralazine. 9. Anemia - follow CBC.    Code Status: Full code.  Family Communication: Patient's family at the bedside.  Disposition Plan: Admit to inpatient.    Allianna Beaubien N. [facility] Pager [phone].  If 7PM-7AM, please contact night-coverage [email or web address] Password [email or web address] 04/05/2014, 9:19  PM

## 2014-04-05 NOTE — ED Notes (Signed)
Pt had a back surgery on 02/16/14. For the last few weeks has been having trouble keeping her right leg up. Has trouble lifting the right leg up off of the bed. Family reports she had been holding her leg up when using the wheelchair to get from short distances but the last couple of days the leg will just drop.

## 2014-04-06 ENCOUNTER — Inpatient Hospital Stay (HOSPITAL_COMMUNITY): Payer: Medicare Other

## 2014-04-06 DIAGNOSIS — R609 Edema, unspecified: Secondary | ICD-10-CM

## 2014-04-06 DIAGNOSIS — J9600 Acute respiratory failure, unspecified whether with hypoxia or hypercapnia: Secondary | ICD-10-CM

## 2014-04-06 LAB — CBC WITH DIFFERENTIAL/PLATELET
Basophils Absolute: 0 10*3/uL (ref 0.0–0.1)
Basophils Relative: 0 % (ref 0–1)
EOS PCT: 2 % (ref 0–5)
Eosinophils Absolute: 0.2 10*3/uL (ref 0.0–0.7)
HCT: 32.1 % — ABNORMAL LOW (ref 36.0–46.0)
Hemoglobin: 10.1 g/dL — ABNORMAL LOW (ref 12.0–15.0)
LYMPHS ABS: 3.4 10*3/uL (ref 0.7–4.0)
LYMPHS PCT: 35 % (ref 12–46)
MCH: 29 pg (ref 26.0–34.0)
MCHC: 31.5 g/dL (ref 30.0–36.0)
MCV: 92.2 fL (ref 78.0–100.0)
MONO ABS: 0.8 10*3/uL (ref 0.1–1.0)
Monocytes Relative: 8 % (ref 3–12)
Neutro Abs: 5.2 10*3/uL (ref 1.7–7.7)
Neutrophils Relative %: 55 % (ref 43–77)
Platelets: 298 10*3/uL (ref 150–400)
RBC: 3.48 MIL/uL — AB (ref 3.87–5.11)
RDW: 15.6 % — ABNORMAL HIGH (ref 11.5–15.5)
WBC: 9.6 10*3/uL (ref 4.0–10.5)

## 2014-04-06 LAB — GLUCOSE, CAPILLARY
GLUCOSE-CAPILLARY: 189 mg/dL — AB (ref 70–99)
Glucose-Capillary: 227 mg/dL — ABNORMAL HIGH (ref 70–99)
Glucose-Capillary: 186 mg/dL — ABNORMAL HIGH (ref 70–99)

## 2014-04-06 LAB — COMPREHENSIVE METABOLIC PANEL
ALT: 13 U/L (ref 0–35)
AST: 15 U/L (ref 0–37)
Albumin: 3 g/dL — ABNORMAL LOW (ref 3.5–5.2)
Alkaline Phosphatase: 60 U/L (ref 39–117)
Anion gap: 10 (ref 5–15)
BILIRUBIN TOTAL: 0.4 mg/dL (ref 0.3–1.2)
BUN: 52 mg/dL — ABNORMAL HIGH (ref 6–23)
CALCIUM: 8.6 mg/dL (ref 8.4–10.5)
CHLORIDE: 93 meq/L — AB (ref 96–112)
CO2: 29 meq/L (ref 19–32)
Creatinine, Ser: 1.87 mg/dL — ABNORMAL HIGH (ref 0.50–1.10)
GFR calc Af Amer: 28 mL/min — ABNORMAL LOW (ref >90)
GFR calc non Af Amer: 24 mL/min — ABNORMAL LOW (ref >90)
Glucose, Bld: 151 mg/dL — ABNORMAL HIGH (ref 70–99)
Potassium: 5.2 mEq/L (ref 3.7–5.3)
SODIUM: 132 meq/L — AB (ref 137–147)
Total Protein: 5.8 g/dL — ABNORMAL LOW (ref 6.0–8.3)

## 2014-04-06 LAB — TSH: TSH: 0.808 u[IU]/mL (ref 0.350–4.500)

## 2014-04-06 LAB — HEPARIN LEVEL (UNFRACTIONATED): Heparin Unfractionated: 0.56 IU/mL (ref 0.30–0.70)

## 2014-04-06 LAB — RAPID URINE DRUG SCREEN, HOSP PERFORMED
Amphetamines: NOT DETECTED
BARBITURATES: NOT DETECTED
Benzodiazepines: NOT DETECTED
COCAINE: NOT DETECTED
Opiates: NOT DETECTED
TETRAHYDROCANNABINOL: NOT DETECTED

## 2014-04-06 LAB — SODIUM, URINE, RANDOM: Sodium, Ur: 39 mEq/L

## 2014-04-06 LAB — CREATININE, URINE, RANDOM: Creatinine, Urine: 52.48 mg/dL

## 2014-04-06 LAB — APTT: aPTT: 26 seconds (ref 24–37)

## 2014-04-06 MED ORDER — HEPARIN (PORCINE) IN NACL 100-0.45 UNIT/ML-% IJ SOLN
1500.0000 [IU]/h | INTRAMUSCULAR | Status: DC
Start: 2014-04-06 — End: 2014-04-08
  Administered 2014-04-06: 1050 [IU]/h via INTRAVENOUS
  Administered 2014-04-07 (×2): 1650 [IU]/h via INTRAVENOUS
  Administered 2014-04-07: 1500 [IU]/h via INTRAVENOUS
  Filled 2014-04-06 (×5): qty 250

## 2014-04-06 MED ORDER — INSULIN ASPART 100 UNIT/ML ~~LOC~~ SOLN
0.0000 [IU] | Freq: Three times a day (TID) | SUBCUTANEOUS | Status: DC
  Administered 2014-04-06: 3 [IU] via SUBCUTANEOUS
  Administered 2014-04-06 – 2014-04-08 (×6): 2 [IU] via SUBCUTANEOUS
  Administered 2014-04-08: 3 [IU] via SUBCUTANEOUS
  Administered 2014-04-09 – 2014-04-10 (×5): 2 [IU] via SUBCUTANEOUS

## 2014-04-06 MED ORDER — INSULIN ASPART 100 UNIT/ML ~~LOC~~ SOLN: 0.0000 [IU] | Freq: Every day | SUBCUTANEOUS | Status: DC

## 2014-04-06 MED ORDER — HYDRALAZINE HCL 20 MG/ML IJ SOLN: 10.0000 mg | INTRAMUSCULAR | Status: DC | PRN

## 2014-04-06 NOTE — Progress Notes (Signed)
ANTICOAGULATION CONSULT NOTE - Initial Consult  Pharmacy Consult for Heparin  Indication: hx of PE  Allergies  Allergen Reactions  . Ace Inhibitors Cough  . Byetta 10 Mcg Pen [Exenatide] Other (See Comments)    Pancreatitis 02/2005  . Beta Adrenergic Blockers Swelling    Tongue swelling    Patient Measurements: Height: 4' 11.84" (152 cm) Weight: 256 lb 4.8 oz (116.257 kg) IBW/kg (Calculated) : 45.14 Heparin Dosing Weight: ~75 kg  Vital Signs: Temp: 98.5 F (36.9 C) (07/24 1650) Temp src: Oral (07/24 1650) BP: 129/87 mmHg (07/24 1650) Pulse Rate: 90 (07/24 1650)  Labs:  Recent Labs  04/05/14 1731 04/05/14 2150 04/06/14 0633 04/06/14 1600  HGB 11.1*  --  10.1*  --   HCT 35.4*  --  32.1*  --   PLT 318  --  298  --   APTT  --  32  --  26  LABPROT  --  25.0*  --   --   INR  --  2.26*  --   --   HEPARINUNFRC  --  >2.20*  --   --   CREATININE 2.32*  --  1.87*  --   TROPONINI <0.30  --   --   --     Medical History: Past Medical History  Diagnosis Date  . DM type 2 (diabetes mellitus, type 2)   . Hyperlipemia   . Hypertension   . Osteopenia     Lumbar spine  . Hx of osteoarthritis     bilateral knee osteoarthritis  -   bilateral knee replacement  . Colon polyps     Tubular Adenoma  . Diverticulosis     patient unaware   . Peripheral neuropathy   . Hiatal hernia   . Heart murmur     no need to follow  . GERD (gastroesophageal reflux disease)     not on meds, tums prn  . Arthritis   . Hypothyroidism   . Gout   . DVT (deep venous thrombosis) 12-31-13  . Pulmonary embolus 12-31-13    Medications:  Xarelto PTA: last dose 7/23 at 0800  Assessment: 80 y/o F with hx of PE on Xarelto here with AMS, low O2 sats. Concern for another acute PE, so VQ was done which showed low probability for acute PE. Baseline anti-coags have been done. Other labs as above. Will use aPTT to guide dosing for now given Xarelto influence on anti-Xa level.  APTT returns low at 26.   Per nurse no issues with drip.    Goal of Therapy:  Heparin level 0.3-0.7 units/ml aPTT 66-102 seconds Monitor platelets by anticoagulation protocol: Yes   Plan:  -Increase heparin to 1350 units/hr  -8 HL aPTT/HL -Daily CBC/HL/aPTT -Monitor for bleeding  Thank you, Vivia Ewing, PharmD Clinical Pharmacist - Resident Pager: [phone] Pharmacy: [phone] 04/06/2014 5:00 PM

## 2014-04-06 NOTE — Progress Notes (Signed)
CBGs to be checked TID & HS.  Recommend adding Novolog SENSITIVE correction scale TID & HS while in the hospital.  Harvel Ricks RN BSN CDE

## 2014-04-06 NOTE — Progress Notes (Signed)
Family concerned that pt has a fracture in her right hip and right femur because of pain that those two sites that has been going on for about a month and asked if an xray could be done. They also wanted pt to got to CIR. Dr. Eliseo Squires made aware of these two concerns. Orders received.

## 2014-04-06 NOTE — Progress Notes (Signed)
VASCULAR LAB PRELIMINARY  PRELIMINARY  PRELIMINARY  PRELIMINARY  Bilateral lower extremity venous duplex  completed.    Preliminary report:  Bilateral:  No evidence of DVT, superficial thrombosis, or Baker's Cyst.    Maurico Perrell, RVT 04/06/2014, 10:28 AM

## 2014-04-06 NOTE — Progress Notes (Signed)
PROGRESS NOTE  Amber Knox ZSW:[id number] DOB: 1930-06-28 DOA: 04/05/2014 PCP: Ival Bible, MD  Assessment/Plan: acute encephalopathy - suspect most likely secondary to medications in a patient with acute renal failure. -decreased patient's Neurontin dose by half given the renal failure and held tizanidine.  -Oxycodone is made on when necessary basis rather than being scheduled.  - ammonia levels ok  MRI brain ruled out any stroke.   Acute renal failure - cause not clear. Patient's diuretics were recently increased and a new one was added. Patient also was receiving Celebrex last few days. At this time I am discontinuing Zaroxolyn and Celebrex and holding of patient's diuretics and ARB.   Hypoxia - patient has history of pulmonary embolism and was recently changed to xarelto -Check VQ scan  -Dopplers of the lower extremity for any PE or DVT negative. -Patient presently is not in respiratory distress.  -heparin infusion for now given the acute renal failure.   History of PE - patient has been placed on heparin and xarelto is on hold given the renal failure.   Diabetes mellitus - patient was recently started on Starlix and Victoza. While in the hospital patient will be on sliding-scale coverage and based on CBGs be place on long-acting insulin.   Hypothyroidism - continue Synthroid.   History of gout - if patient's renal function worsens may have to decrease allopurinol dose.   ypertension - since patient is off ARB and diuretics I have placed patient on when necessary IV hydralazine.   Anemia - follow CBC   Code Status: full Family Communication: patient and granddaughter Disposition Plan: ALF   Consultants:    Procedures:      HPI/Subjective: Feeling well No SOB  Objective: Filed Vitals:   04/06/14 1000  BP: 157/65  Pulse: 89  Temp: 98.3 F (36.8 C)  Resp: 18    Intake/Output Summary (Last 24 hours) at 04/06/14 1150 Last data filed at 04/06/14 1000  Gross per 24 hour  Intake    510 ml  Output    426 ml  Net     84 ml   Filed Weights   04/05/14 2121  Weight: 116.257 kg (256 lb 4.8 oz)    Exam:   General:  NAD- working with PT  Cardiovascular: rrr  Respiratory: clear  Abdomen: +BS, soft  Musculoskeletal: +edema   Data Reviewed: Basic Metabolic Panel:  Recent Labs Lab 04/05/14 1731 04/06/14 0633  NA 131* 132*  K 5.5* 5.2  CL 91* 93*  CO2 26 29  GLUCOSE 203* 151*  BUN 55* 52*  CREATININE 2.32* 1.87*  CALCIUM 8.4 8.6   Liver Function Tests:  Recent Labs Lab 04/05/14 1731 04/06/14 0633  AST 20 15  ALT 15 13  ALKPHOS 66 60  BILITOT 0.3 0.4  PROT 6.3 5.8*  ALBUMIN 3.3* 3.0*   No results found for this basename: LIPASE, AMYLASE,  in the last 168 hours  Recent Labs Lab 04/05/14 1731  AMMONIA 17   CBC:  Recent Labs Lab 04/05/14 1731 04/06/14 0633  WBC 11.4* 9.6  NEUTROABS 6.5 5.2  HGB 11.1* 10.1*  HCT 35.4* 32.1*  MCV 92.4 92.2  PLT 318 298   Cardiac Enzymes:  Recent Labs Lab 04/05/14 1731  TROPONINI <0.30   BNP (last 3 results)  Recent Labs  01/02/14 0246 04/05/14 1731  PROBNP 4037.0* 711.3*   CBG:  Recent Labs Lab 04/05/14 2125  GLUCAP 174*    Recent Results (from the past 240 hour(s))  MRSA PCR SCREENING     Status: None   Collection Time    04/05/14  9:41 PM      Result Value Ref Range Status   MRSA by PCR NEGATIVE  NEGATIVE Final   Comment:            The GeneXpert MRSA Assay (FDA     approved for NASAL specimens     only), is one component of a     comprehensive MRSA colonization     surveillance program. It is not     intended to diagnose MRSA     infection nor to guide or     monitor treatment for     MRSA infections.     Studies: Dg Chest 2 View  04/05/2014   CLINICAL DATA:  Altered mental status.  EXAM: CHEST  2 VIEW  COMPARISON:  CT.  08/2014.  Chest x-ray 01/01/2014.  FINDINGS: Mediastinum and hilar structures stable. Stable right pulmonary artery  prominence. Lungs are clear. Heart size normal. No pleural effusion or pneumothorax. No acute osseus abnormality.  IMPRESSION: No active cardiopulmonary disease.   Electronically Signed   By: Ostrander   On: 04/05/2014 18:07   Ct Head Wo Contrast  04/05/2014   CLINICAL DATA:  Altered mental status.  EXAM: CT HEAD WITHOUT CONTRAST  TECHNIQUE: Contiguous axial images were obtained from the base of the skull through the vertex without intravenous contrast.  COMPARISON:  PET-CT 01/10/2014.  FINDINGS: No mass. No hydrocephalus. No hemorrhage. White matter changes consistent with chronic ischemia. Dural type calcification noted along the right cerebellar hemisphere. This was slightly identified on prior PET CT. No acute bony abnormality. Mucous retention cyst both maxillary sinuses.  IMPRESSION: White matter changes consistent chronic ischemia. No acute abnormality.   Electronically Signed   By: Marcello Moores  Register   On: 04/05/2014 18:24   Mri Brain Without Contrast  04/06/2014   CLINICAL DATA:  Altered mental status  EXAM: MRI HEAD WITHOUT CONTRAST  TECHNIQUE: Multiplanar, multiecho pulse sequences of the brain and surrounding structures were obtained without intravenous contrast.  COMPARISON:  CT head 04/05/2014  FINDINGS: Ventricle size is normal. Cerebral volume normal for age. Craniocervical junction normal. Pituitary normal in size.  Patchy hyperintensity in the cerebral white matter bilaterally and in the pons bilaterally consistent with chronic microvascular ischemia.  Negative for acute infarct.  Negative for hemorrhage or mass.  No edema or midline shift.  Paranasal sinuses reveal mild mucosal edema in the right sphenoid sinus and in the maxillary sinuses bilaterally.  IMPRESSION: Chronic microvascular ischemia.  No acute intracranial abnormality.   Electronically Signed   By: Franchot Gallo M.D.   On: 04/06/2014 08:32   US Abdomen Complete  04/06/2014   CLINICAL DATA:  Acute renal failure with  nausea. History of cholecystectomy.  EXAM: ULTRASOUND ABDOMEN COMPLETE  COMPARISON:  PET CT 01/10/2014 and abdominal MR 07/22/2009  FINDINGS: Gallbladder:  Removed.  Common bile duct:  Diameter: 0.6 cm  Liver:  No focal lesion identified. Within normal limits in parenchymal echogenicity.  IVC:  No abnormality visualized.  Pancreas:  Main pancreatic duct roughly measures 0.3 cm. There is a round hypoechoic structure in the pancreatic body region that roughly measures up to 1.0 cm. This structure is indeterminate and not clearly identified on the prior imaging.  Spleen:  Spleen is small and poorly characterized.  Right Kidney:  Length: 11.6 cm. There is mild cortical thinning without hydronephrosis. Hypoechoic structure in the mid right  kidney measures up to 1.2 cm and suggestive for a cyst. Anechoic structure in the lower pole measures up to 3.0 cm and compatible with a cyst.  Left Kidney:  Length: 13.0 cm. There is a round anechoic structure in the left kidney upper pole with posterior acoustic enhancement. This is compatible with a cyst that measures up to 7.5 cm. There are additional left renal cysts. There is a prominent cyst in the lower pole that measures roughly 5.4 cm. The left kidney parenchyma is diffusely echogenic and poor differentiation between the cortex and medullary region. No evidence for hydronephrosis.  Abdominal aorta:  No aneurysm visualized.  Other findings:  None.  IMPRESSION: Negative for hydronephrosis.  Bilateral renal cysts. Left kidney is echogenic and there is cortical thinning in the right kidney. Findings suggest underlying medical renal disease.  Indeterminate 1.0 cm hypoechoic structure within the pancreas. This could represent a small complex cystic structure and not confidently identified on the prior imaging. This area could be further evaluated with a non-emergent MRI.   Electronically Signed   By: Markus Daft M.D.   On: 04/06/2014 08:07   Nm Pulmonary Perf And Vent  04/05/2014    CLINICAL DATA:  Shortness of breath. Renal insufficiency. Unknown primary cancer.  EXAM: NUCLEAR MEDICINE VENTILATION - PERFUSION LUNG SCAN  TECHNIQUE: Ventilation images were obtained in multiple projections using inhaled aerosol technetium 99 M DTPA. Perfusion images were obtained in multiple projections after intravenous injection of Tc-44m MAA.  RADIOPHARMACEUTICALS:  40.0 MCi Tc-79m DTPA aerosol and 6.0 mCi Tc-58m MAA  COMPARISON:  Chest radiographs 04/05/2014, PET-CT 01/10/2014 and chest CT 12/31/2013.  FINDINGS: Ventilation: Ventilation images are limited by central clumping of the air cell. No focal ventilatory defects are identified, although some obstructive lung disease suspected.  Perfusion: No wedge shaped peripheral perfusion defects to suggest acute pulmonary embolism. Small peripheral perfusion defects likely represent chronic obstructive pulmonary disease. These appear matched with the ventilatory examination.  IMPRESSION: Low probability for acute pulmonary embolism. There are findings suggesting a degree of obstructive lung disease.   Electronically Signed   By: Camie Patience M.D.   On: 04/05/2014 23:54    Scheduled Meds: . allopurinol  300 mg Oral Daily  . gabapentin  300 mg Oral QHS  . levothyroxine  112 mcg Oral QAC breakfast  . lidocaine  2 patch Transdermal Q24H  . ondansetron  8 mg Oral TID AC  . pantoprazole  40 mg Oral Daily  . sodium chloride  3 mL Intravenous Q12H   Continuous Infusions: . heparin 1,050 Units/hr (04/06/14 0853)   Antibiotics Given (last 72 hours)   None      Principal Problem:   Acute encephalopathy Active Problems:   DM type 2 (diabetes mellitus, type 2)   Unspecified hypothyroidism   SOB (shortness of breath)   ARF (acute renal failure)   Pulmonary embolism    Time spent: 35 min    Ashleah Valtierra  [facility] Pager [phone]. If 7PM-7AM, please contact night-coverage at [email or web address], password [facility] 04/06/2014, 11:50 AM  LOS: 1  day

## 2014-04-06 NOTE — Evaluation (Signed)
Physical Therapy Evaluation Patient Details Name: Amber Knox MRN: [id number] DOB: May 06, 1930 Today's Date: 04/06/2014   History of Present Illness  Pt [place] from [facility] [place] with AMS and renal dysfunction with MRI negative.Pt with laminectomoy L2-5 June 5 with D/C to SNF and then ALF x 1week.   Clinical Impression  Pt with chronic inability to ambulate due to radiating back pain in L2 distribution. Pt unable to achieve upright posture or ambulate today. Pt and granddgtr educated for back precautions and adherence to them but pt maintaining bending throughout stating it relieves her pain. Pt with decreased mobility, strength and function who will benefit from acute trial of therapy to maximize function and activity.    Follow Up Recommendations Home health PT;Supervision for mobility/OOB (at ALF with assist for mobility)    Equipment Recommendations  None recommended by PT    Recommendations for Other Services       Precautions / Restrictions Precautions Precautions: Back Precaution Comments: pt unable to follow back precautions due to pain and in chair needing to elevate legs due to edema. Pt educated for ideal adherence to precautions      Mobility  Bed Mobility Overal bed mobility: Needs Assistance Bed Mobility: Rolling;Sidelying to Sit Rolling: Min assist Sidelying to sit: Min assist       General bed mobility comments: cues for sequence, precautions and assist to bring legs off of bed and elevate trunk from surface  Transfers Overall transfer level: Needs assistance   Transfers: Sit to/from Stand;Stand Pivot Transfers Sit to Stand: Min assist Stand pivot transfers: Min assist       General transfer comment: cues for sequence and hand placement with pt able to transfer bed>BSC>recliner with and without RW with excessive trunk and hip flexion throughout despite max cues  Ambulation/Gait Ambulation/Gait assistance:  (unable secondary to pain)               Stairs            Wheelchair Mobility    Modified Rankin (Stroke Patients Only)       Balance Overall balance assessment: Needs assistance   Sitting balance-Leahy Scale: Good       Standing balance-Leahy Scale: Poor                               Pertinent Vitals/Pain 6/10 right radiating pain in sitting and 10/10 in standing    Home Living Family/patient expects to be discharged to:: Assisted living               Home Equipment: Walker - 2 wheels;Wheelchair - manual      Prior Function Level of [place]: Needs assistance   Gait / Transfers Assistance Needed: Pt has not ambulated since April the week after her back surgery she walked 80' but that was only for 1 week. min assist for transfers OOB to chair and BSC  ADL's / Homemaking Assistance Needed: staff performs all homemaking and cooking as well as back and lower body bathing/dressing        Hand Dominance        Extremity/Trunk Assessment   Upper Extremity Assessment: Generalized weakness           Lower Extremity Assessment: Generalized weakness (pt with limited tolerance for standing, unable to complete full ROM due to pain )      Cervical / Trunk Assessment: Lordotic;Other exceptions  Communication  Communication: No difficulties  Cognition Arousal/Alertness: Awake/alert Behavior During Therapy: WFL for tasks assessed/performed         Memory: Decreased short-term memory              General Comments      Exercises        Assessment/Plan    PT Assessment Patient needs continued PT services  PT Diagnosis Difficulty walking;Acute pain   PT Problem List Decreased strength;Decreased activity tolerance;Pain;Decreased knowledge of use of DME;Decreased mobility;Obesity  PT Treatment Interventions DME instruction;Functional mobility training;Therapeutic activities;Therapeutic exercise;Patient/family education;Gait training   PT Goals  (Current goals can be found in the Care Plan section) Acute Rehab PT Goals Patient Stated Goal: be able to walk PT Goal Formulation: With patient/family Time For Goal Achievement: 04/20/14 Potential to Achieve Goals: Poor    Frequency Min 2X/week   Barriers to discharge        Co-evaluation               End of Session Equipment Utilized During Treatment: Gait belt Activity Tolerance: Patient limited by pain Patient left: in chair;with call bell/phone within reach;with chair alarm set;with family/visitor present Nurse Communication: Mobility status;Precautions         Time: 5003-7048 PT Time Calculation (min): 32 min   Charges:   PT Evaluation $Initial PT Evaluation Tier I: 1 Procedure PT Treatments $Therapeutic Activity: 8-22 mins   PT G CodesMelford Aase 04/06/2014, 11:48 AM Elwyn Reach, [place]

## 2014-04-06 NOTE — Progress Notes (Signed)
Clinical Social Work Department BRIEF PSYCHOSOCIAL ASSESSMENT 04/06/2014  Patient:  Amber Knox, Amber Knox     Account Number:  [id number]     Admit date:  04/05/2014  Clinical Social Worker:  Megan Salon  Date/Time:  04/06/2014 01:26 PM  Referred by:  Physician  Date Referred:  04/06/2014 Referred for  ALF Placement   Other Referral:   Interview type:  Other - See comment Other interview type:   CSW spoke to patient and patient's family by bedside    PSYCHOSOCIAL DATA Living Status:  FACILITY Admitted from facility:  [facility] Level of care:  Assisted Living Primary support name:  Luis Abed Primary support relationship to patient:  CHILD, ADULT Degree of support available:   Good    CURRENT CONCERNS Current Concerns  Post-Acute Placement   Other Concerns:    SOCIAL WORK ASSESSMENT / PLAN CSW received consult that patient is from Assisted Living. CSW went to patient's room and introduced self and explained reason for visit. Patient's family was by bedside. Patient's daughter states that patient is from [facility] and plans on patient going back to ALF when ready. Daughter states that patient does not have anymore SNF days left and has to go back to ALF. CSW notified ALF, who states they can take patient back when ready. CSW will continue to follow for dc back to ALF when medically ready.   Assessment/plan status:  Psychosocial Support/Ongoing Assessment of Needs Other assessment/ plan:   Information/referral to community resources:   CSW information    PATIENT'S/FAMILY'S RESPONSE TO PLAN OF CARE: Patient's daughter states that patient can go back to ALF when ready.        Jeanette Caprice, MSW, [place]

## 2014-04-06 NOTE — Progress Notes (Signed)
CSW spoke with pt's granddaughter and daughter re: d/c planning concerns.  Pt's family is not happy with the care pt is receiving at her ALF and may be interested in another facility.  List of ALFs given.  Family also exploring taking pt home with [facility] and privately hiring help at home.  Private duty list given.  Family encouraged to let CSW know of their plans as soon as possible so d/c arrangements can be made in a timely manner.  CSW will continue to follow.

## 2014-04-06 NOTE — Care Management Note (Signed)
    Page 1 of 1   04/09/2014     12:28:31 PM CARE MANAGEMENT NOTE 04/09/2014  Patient:  Amber Knox, Amber Knox   Account Number:  [id number]  Date Initiated:  04/06/2014  Documentation initiated by:  GRAVES-BIGELOW,Eliaz Fout  Subjective/Objective Assessment:   Pt admitted with AMS. Pt is from ALF. Has family support at bedside. Per family wished to have a CIR consult. Based on dx and insurance- CIR will not be approved.     Action/Plan:   CM did discuss with pt and family. CM did make family aware that CSW can provide family with resources to ALF and SNF in [place] and [place]. Staff RN & MD aware of plan.   Anticipated DC Date:  04/07/2014   Anticipated DC Plan:  SKILLED NURSING FACILITY  In-house referral  Clinical Social Worker      DC Planning Services  CM consult      Choice offered to / List presented to:             Status of service:  Completed, signed off Medicare Important Message given?  YES (If response is "NO", the following Medicare IM given date fields will be blank) Date Medicare IM given:  04/09/2014 Medicare IM given by:  GRAVES-BIGELOW,Miyonna [place] Date Additional Medicare IM given:   Additional Medicare IM given by:    Discharge Disposition:  [place]  Per UR Regulation:  Reviewed for med. necessity/level of care/duration of stay  If discussed at [place] of Stay Meetings, dates discussed:   04/10/2014    Comments:  04-09-14 [address] [place] [phone] Plan for SNF in am.

## 2014-04-06 NOTE — Progress Notes (Signed)
Thank you for consult on Amber Knox. Chart reviewed and note that patient was admitted with MS changes due to dehydration as well as build up of multiple medications.  We concur with therapy recommendations for follow up HHPT and supervision at her ALF.  Will defer CIR consult for now.

## 2014-04-06 NOTE — Progress Notes (Signed)
ANTICOAGULATION CONSULT NOTE - Initial Consult  Pharmacy Consult for Heparin  Indication: hx of PE  Allergies  Allergen Reactions  . Ace Inhibitors Cough  . Byetta 10 Mcg Pen [Exenatide] Other (See Comments)    Pancreatitis 02/2005  . Beta Adrenergic Blockers Swelling    Tongue swelling    Patient Measurements: Height: 4' 11.84" (152 cm) Weight: 256 lb 4.8 oz (116.257 kg) IBW/kg (Calculated) : 45.14 Heparin Dosing Weight: ~75 kg  Vital Signs: Temp: 98.6 F (37 C) (07/23 1658) Temp src: Oral (07/23 2121) BP: 118/84 mmHg (07/24 0000) Pulse Rate: 76 (07/24 0000)  Labs:  Recent Labs  04/05/14 1731 04/05/14 2150  HGB 11.1*  --   HCT 35.4*  --   PLT 318  --   APTT  --  32  LABPROT  --  25.0*  INR  --  2.26*  HEPARINUNFRC  --  >2.20*  CREATININE 2.32*  --   TROPONINI <0.30  --     Medical History: Past Medical History  Diagnosis Date  . DM type 2 (diabetes mellitus, type 2)   . Hyperlipemia   . Hypertension   . Osteopenia     Lumbar spine  . Hx of osteoarthritis     bilateral knee osteoarthritis  -   bilateral knee replacement  . Colon polyps     Tubular Adenoma  . Diverticulosis     patient unaware   . Peripheral neuropathy   . Hiatal hernia   . Heart murmur     no need to follow  . GERD (gastroesophageal reflux disease)     not on meds, tums prn  . Arthritis   . Hypothyroidism   . Gout   . DVT (deep venous thrombosis) 12-31-13  . Pulmonary embolus 12-31-13    Medications:  Xarelto PTA: last dose 7/23 at 0800  Assessment: 79 y/o F with hx of PE on Xarelto here with AMS, low O2 sats. Concern for another acute PE, so VQ was done which showed low probability for acute PE. Baseline anti-coags have been done. Other labs as above. Given acute renal failure, will start heparin at next scheduled Xarelto dose. Will use aPTT to guide dosing for now given Xarelto influence on anti-Xa level.   Goal of Therapy:  Heparin level 0.3-0.7 units/ml aPTT 66-102  seconds Monitor platelets by anticoagulation protocol: Yes   Plan:  -Start heparin at 1050 units/hr at 0800 today -1600 aPTT/HL -Daily CBC/HL/aPTT -Monitor for bleeding  [name] 04/06/2014,12:49 AM

## 2014-04-07 DIAGNOSIS — I2699 Other pulmonary embolism without acute cor pulmonale: Secondary | ICD-10-CM

## 2014-04-07 LAB — BASIC METABOLIC PANEL
Anion gap: 9 (ref 5–15)
BUN: 41 mg/dL — AB (ref 6–23)
CHLORIDE: 96 meq/L (ref 96–112)
CO2: 30 mEq/L (ref 19–32)
Calcium: 8.5 mg/dL (ref 8.4–10.5)
Creatinine, Ser: 1.49 mg/dL — ABNORMAL HIGH (ref 0.50–1.10)
GFR calc non Af Amer: 32 mL/min — ABNORMAL LOW (ref >90)
GFR, EST AFRICAN AMERICAN: 37 mL/min — AB (ref >90)
Glucose, Bld: 183 mg/dL — ABNORMAL HIGH (ref 70–99)
Potassium: 4.7 mEq/L (ref 3.7–5.3)
Sodium: 135 mEq/L — ABNORMAL LOW (ref 137–147)

## 2014-04-07 LAB — CBC
HEMATOCRIT: 31 % — AB (ref 36.0–46.0)
Hemoglobin: 9.5 g/dL — ABNORMAL LOW (ref 12.0–15.0)
MCH: 28.5 pg (ref 26.0–34.0)
MCHC: 30.6 g/dL (ref 30.0–36.0)
MCV: 93.1 fL (ref 78.0–100.0)
Platelets: 290 10*3/uL (ref 150–400)
RBC: 3.33 MIL/uL — ABNORMAL LOW (ref 3.87–5.11)
RDW: 15.7 % — ABNORMAL HIGH (ref 11.5–15.5)
WBC: 8.3 10*3/uL (ref 4.0–10.5)

## 2014-04-07 LAB — APTT
APTT: 54 s — AB (ref 24–37)
aPTT: 64 seconds — ABNORMAL HIGH (ref 24–37)
aPTT: 73 seconds — ABNORMAL HIGH (ref 24–37)

## 2014-04-07 LAB — URINE CULTURE: Colony Count: 4000

## 2014-04-07 LAB — HEPARIN LEVEL (UNFRACTIONATED)
HEPARIN UNFRACTIONATED: 0.88 [IU]/mL — AB (ref 0.30–0.70)
Heparin Unfractionated: 0.98 IU/mL — ABNORMAL HIGH (ref 0.30–0.70)
Heparin Unfractionated: 0.91 IU/mL — ABNORMAL HIGH (ref 0.30–0.70)

## 2014-04-07 LAB — GLUCOSE, CAPILLARY
GLUCOSE-CAPILLARY: 192 mg/dL — AB (ref 70–99)
Glucose-Capillary: 161 mg/dL — ABNORMAL HIGH (ref 70–99)
Glucose-Capillary: 191 mg/dL — ABNORMAL HIGH (ref 70–99)

## 2014-04-07 NOTE — Progress Notes (Signed)
ANTICOAGULATION CONSULT NOTE - Follow Up Consult  Pharmacy Consult for heparin Indication: hx of PE  Allergies  Allergen Reactions  . Ace Inhibitors Cough  . Byetta 10 Mcg Pen [Exenatide] Other (See Comments)    Pancreatitis 02/2005  . Beta Adrenergic Blockers Swelling    Tongue swelling     Patient Measurements: Height: 4' 11.84" (152 cm) Weight: 256 lb 4.8 oz (116.257 kg) IBW/kg (Calculated) : 45.14 Heparin Dosing Weight: ~75 kg  Vital Signs: Temp: 98.7 F (37.1 C) (07/25 0744) Temp src: Oral (07/25 0744) BP: 137/68 mmHg (07/25 0744) Pulse Rate: 85 (07/25 0744)  Labs:  Recent Labs  04/05/14 1731 04/05/14 2150 04/06/14 0633 04/06/14 1600 04/07/14 0051  HGB 11.1*  --  10.1*  --  9.5*  HCT 35.4*  --  32.1*  --  31.0*  PLT 318  --  298  --  290  APTT  --  32  --  26 54*  LABPROT  --  25.0*  --   --   --   INR  --  2.26*  --   --   --   HEPARINUNFRC  --  >2.20*  --  0.56 0.98*  CREATININE 2.32*  --  1.87*  --  1.49*  TROPONINI <0.30  --   --   --   --     Estimated Creatinine Clearance: 33.8 ml/min (by C-G formula based on Cr of 1.49).   Medications:  Scheduled:  . allopurinol  300 mg Oral Daily  . gabapentin  300 mg Oral QHS  . insulin aspart  0-5 Units Subcutaneous QHS  . insulin aspart  0-9 Units Subcutaneous TID WC  . levothyroxine  112 mcg Oral QAC breakfast  . lidocaine  2 patch Transdermal Q24H  . ondansetron  8 mg Oral TID AC  . pantoprazole  40 mg Oral Daily  . sodium chloride  3 mL Intravenous Q12H   Infusions:  . heparin 1,500 Units/hr (04/07/14 0601)    Assessment: 80 yo female with hx of PE is currently on subtherapeutic heparin.  Heparin level is 0.88 (still have effect from Xarelto) and aPTT is 64.  Patient was previously on Xarelto.  Goal of Therapy:  aPTT 66-102 seconds; heparin level 0.3-0.7 Monitor platelets by anticoagulation protocol: Yes   Plan:  1) Increase heparin at 1650 units/hr 2) Check an 8hr heparin level and aPTT  after rate is increased  Kenzleigh Sedam, Tsz-Yin 04/07/2014,9:14 AM

## 2014-04-07 NOTE — Progress Notes (Signed)
Pharmacy: re-Heparin  Patient is an 80 y.o F on xarelto PTA for hx PE and now has transitioned over to heparin. Heparin level 0.91 (goal 0.3-0.7), aPTT 73 (goal 66-102 sec) with rate changed to 1650 units/hr this morning.  Elevated heparin level is likely due to residual effect of xarelto in her system.  Will continue to dose her heparin based on aPTT.  Plan: 1) continue heparin drip at 1650 units/hr as aPTT is therapeutic at this current rate. 2) f/u with AM labs and adjust rate if needed.  Dia Sitter, PharmD, BCPS

## 2014-04-07 NOTE — Progress Notes (Signed)
ANTICOAGULATION CONSULT NOTE - Initial Consult  Pharmacy Consult for Heparin  Indication: hx of PE  Allergies  Allergen Reactions  . Ace Inhibitors Cough  . Byetta 10 Mcg Pen [Exenatide] Other (See Comments)    Pancreatitis 02/2005  . Beta Adrenergic Blockers Swelling    Tongue swelling    Patient Measurements: Height: 4' 11.84" (152 cm) Weight: 256 lb 4.8 oz (116.257 kg) IBW/kg (Calculated) : 45.14 Heparin Dosing Weight: ~75 kg  Vital Signs: Temp: 98.5 F (36.9 C) (07/24 2329) Temp src: Oral (07/24 2329) BP: 155/52 mmHg (07/24 2329) Pulse Rate: 73 (07/24 2329)  Labs:  Recent Labs  04/05/14 1731 04/05/14 2150 04/06/14 0633 04/06/14 1600 04/07/14 0051  HGB 11.1*  --  10.1*  --  9.5*  HCT 35.4*  --  32.1*  --  31.0*  PLT 318  --  298  --  290  APTT  --  32  --  26 54*  LABPROT  --  25.0*  --   --   --   INR  --  2.26*  --   --   --   HEPARINUNFRC  --  >2.20*  --  0.56 0.98*  CREATININE 2.32*  --  1.87*  --  1.49*  TROPONINI <0.30  --   --   --   --     Medical History: Past Medical History  Diagnosis Date  . DM type 2 (diabetes mellitus, type 2)   . Hyperlipemia   . Hypertension   . Osteopenia     Lumbar spine  . Hx of osteoarthritis     bilateral knee osteoarthritis  -   bilateral knee replacement  . Colon polyps     Tubular Adenoma  . Diverticulosis     patient unaware   . Peripheral neuropathy   . Hiatal hernia   . Heart murmur     no need to follow  . GERD (gastroesophageal reflux disease)     not on meds, tums prn  . Arthritis   . Hypothyroidism   . Gout   . DVT (deep venous thrombosis) 12-31-13  . Pulmonary embolus 12-31-13    Medications:  Xarelto PTA: last dose 7/23 at 0800  Assessment: 80 y/o F with hx of PE on Xarelto here with AMS, low O2 sats. Concern for another acute PE, so VQ was done which showed low probability for acute PE. Baseline anti-coags have been done. Other labs as above. Given acute renal failure, on heparin. APTT low  at 54 and HL 0.98 (still appears to be some Xarelto influence on HL, will continue with aPTT for now). No issues per RN.   Goal of Therapy:  Heparin level 0.3-0.7 units/ml aPTT 66-102 seconds Monitor platelets by anticoagulation protocol: Yes   Plan:  -Increase heparin at 1500 units/hr  -1000 aPTT/HL -Daily CBC/HL/aPTT -Monitor for bleeding  [name] 04/07/2014,2:34 AM

## 2014-04-07 NOTE — Progress Notes (Signed)
PROGRESS NOTE  Amber Knox VOZ:[id number] DOB: Feb 01, 1930 DOA: 04/05/2014 PCP: Ival Bible, MD  Assessment/Plan: acute encephalopathy - suspect most likely secondary to medications in a patient with acute renal failure. -decreased patient's Neurontin dose by half given the renal failure and held tizanidine.  -Oxycodone is made PRN.  - ammonia levels ok  MRI brain ruled out any stroke.   Acute renal failure - cause not clear. Patient's diuretics were recently increased and a new one was added. Patient also was receiving Celebrex last few days. At this time I am discontinuing Zaroxolyn and Celebrex and holding  patient's diuretics and ARB.   Hypoxia - patient has history of pulmonary embolism and was recently changed to xarelto -Check VQ scan  -Dopplers of the lower extremity for any PE or DVT negative. -Patient presently is not in respiratory distress.  -heparin infusion for now given the acute renal failure.   History of PE - patient has been placed on heparin and xarelto is on hold given the renal failure.   Diabetes mellitus - patient was recently started on Starlix and Victoza. While in the hospital patient will be on sliding-scale coverage and based on CBGs will be place on long-acting insulin.   Hypothyroidism - continue Synthroid.   History of gout - allopurinol  Hypertension - since patient is off ARB and diuretics I have placed patient on when necessary IV hydralazine.   Anemia - follow CBC  Malnutrition- encourage protein intake  Code Status: full Family Communication: patient and granddaughter Disposition Plan: ALF   Consultants:    Procedures:      HPI/Subjective: Feeling well No SOB  Objective: Filed Vitals:   04/07/14 0744  BP: 137/68  Pulse: 85  Temp: 98.7 F (37.1 C)  Resp: 17    Intake/Output Summary (Last 24 hours) at 04/07/14 1111 Last data filed at 04/07/14 0831  Gross per 24 hour  Intake    720 ml  Output   3000 ml  Net  -2280  ml   Filed Weights   04/05/14 2121  Weight: 116.257 kg (256 lb 4.8 oz)    Exam:   General:  NAD- working with PT  Cardiovascular: rrr  Respiratory: clear  Abdomen: +BS, soft  Musculoskeletal: +edema   Data Reviewed: Basic Metabolic Panel:  Recent Labs Lab 04/05/14 1731 04/06/14 0633 04/07/14 0051  NA 131* 132* 135*  K 5.5* 5.2 4.7  CL 91* 93* 96  CO2 26 29 30   GLUCOSE 203* 151* 183*  BUN 55* 52* 41*  CREATININE 2.32* 1.87* 1.49*  CALCIUM 8.4 8.6 8.5   Liver Function Tests:  Recent Labs Lab 04/05/14 1731 04/06/14 0633  AST 20 15  ALT 15 13  ALKPHOS 66 60  BILITOT 0.3 0.4  PROT 6.3 5.8*  ALBUMIN 3.3* 3.0*   No results found for this basename: LIPASE, AMYLASE,  in the last 168 hours  Recent Labs Lab 04/05/14 1731  AMMONIA 17   CBC:  Recent Labs Lab 04/05/14 1731 04/06/14 0633 04/07/14 0051  WBC 11.4* 9.6 8.3  NEUTROABS 6.5 5.2  --   HGB 11.1* 10.1* 9.5*  HCT 35.4* 32.1* 31.0*  MCV 92.4 92.2 93.1  PLT 318 298 290   Cardiac Enzymes:  Recent Labs Lab 04/05/14 1731  TROPONINI <0.30   BNP (last 3 results)  Recent Labs  01/02/14 0246 04/05/14 1731  PROBNP 4037.0* 711.3*   CBG:  Recent Labs Lab 04/05/14 2125 04/06/14 1202 04/06/14 1649 04/06/14 2114 04/07/14 [place]  174* 189* 227* 186* 161*    Recent Results (from the past 240 hour(s))  MRSA PCR SCREENING     Status: None   Collection Time    04/05/14  9:41 PM      Result Value Ref Range Status   MRSA by PCR NEGATIVE  NEGATIVE Final   Comment:            The GeneXpert MRSA Assay (FDA     approved for NASAL specimens     only), is one component of a     comprehensive MRSA colonization     surveillance program. It is not     intended to diagnose MRSA     infection nor to guide or     monitor treatment for     MRSA infections.     Studies: Dg Chest 2 View  04/05/2014   CLINICAL DATA:  Altered mental status.  EXAM: CHEST  2 VIEW  COMPARISON:  CT.  08/2014.   Chest x-ray 01/01/2014.  FINDINGS: Mediastinum and hilar structures stable. Stable right pulmonary artery prominence. Lungs are clear. Heart size normal. No pleural effusion or pneumothorax. No acute osseus abnormality.  IMPRESSION: No active cardiopulmonary disease.   Electronically Signed   By: Marcello Moores  Register   On: 04/05/2014 18:07   Dg Hip Complete Right  04/07/2014   CLINICAL DATA:  Right hip pain.  EXAM: RIGHT HIP - COMPLETE 2+ VIEW  COMPARISON:  None.  FINDINGS: There is no evidence of fracture or dislocation. Both femoral heads are seated normally within their respective acetabula. The proximal right femur appears intact. Minimal degenerative change is noted at the lower lumbar spine. The sacroiliac joints are unremarkable in appearance.  The visualized bowel gas pattern is grossly unremarkable in appearance.  IMPRESSION: No evidence of fracture or dislocation.   Electronically Signed   By: Garald Balding M.D.   On: 04/07/2014 06:54   Ct Head Wo Contrast  04/05/2014   CLINICAL DATA:  Altered mental status.  EXAM: CT HEAD WITHOUT CONTRAST  TECHNIQUE: Contiguous axial images were obtained from the base of the skull through the vertex without intravenous contrast.  COMPARISON:  PET-CT 01/10/2014.  FINDINGS: No mass. No hydrocephalus. No hemorrhage. White matter changes consistent with chronic ischemia. Dural type calcification noted along the right cerebellar hemisphere. This was slightly identified on prior PET CT. No acute bony abnormality. Mucous retention cyst both maxillary sinuses.  IMPRESSION: White matter changes consistent chronic ischemia. No acute abnormality.   Electronically Signed   By: Marcello Moores  Register   On: 04/05/2014 18:24   Mri Brain Without Contrast  04/06/2014   CLINICAL DATA:  Altered mental status  EXAM: MRI HEAD WITHOUT CONTRAST  TECHNIQUE: Multiplanar, multiecho pulse sequences of the brain and surrounding structures were obtained without intravenous contrast.  COMPARISON:  CT  head 04/05/2014  FINDINGS: Ventricle size is normal. Cerebral volume normal for age. Craniocervical junction normal. Pituitary normal in size.  Patchy hyperintensity in the cerebral white matter bilaterally and in the pons bilaterally consistent with chronic microvascular ischemia.  Negative for acute infarct.  Negative for hemorrhage or mass.  No edema or midline shift.  Paranasal sinuses reveal mild mucosal edema in the right sphenoid sinus and in the maxillary sinuses bilaterally.  IMPRESSION: Chronic microvascular ischemia.  No acute intracranial abnormality.   Electronically Signed   By: Franchot Gallo M.D.   On: 04/06/2014 08:32   US Abdomen Complete  04/06/2014   CLINICAL DATA:  Acute renal  failure with nausea. History of cholecystectomy.  EXAM: ULTRASOUND ABDOMEN COMPLETE  COMPARISON:  PET CT 01/10/2014 and abdominal MR 07/22/2009  FINDINGS: Gallbladder:  Removed.  Common bile duct:  Diameter: 0.6 cm  Liver:  No focal lesion identified. Within normal limits in parenchymal echogenicity.  IVC:  No abnormality visualized.  Pancreas:  Main pancreatic duct roughly measures 0.3 cm. There is a round hypoechoic structure in the pancreatic body region that roughly measures up to 1.0 cm. This structure is indeterminate and not clearly identified on the prior imaging.  Spleen:  Spleen is small and poorly characterized.  Right Kidney:  Length: 11.6 cm. There is mild cortical thinning without hydronephrosis. Hypoechoic structure in the mid right kidney measures up to 1.2 cm and suggestive for a cyst. Anechoic structure in the lower pole measures up to 3.0 cm and compatible with a cyst.  Left Kidney:  Length: 13.0 cm. There is a round anechoic structure in the left kidney upper pole with posterior acoustic enhancement. This is compatible with a cyst that measures up to 7.5 cm. There are additional left renal cysts. There is a prominent cyst in the lower pole that measures roughly 5.4 cm. The left kidney parenchyma is  diffusely echogenic and poor differentiation between the cortex and medullary region. No evidence for hydronephrosis.  Abdominal aorta:  No aneurysm visualized.  Other findings:  None.  IMPRESSION: Negative for hydronephrosis.  Bilateral renal cysts. Left kidney is echogenic and there is cortical thinning in the right kidney. Findings suggest underlying medical renal disease.  Indeterminate 1.0 cm hypoechoic structure within the pancreas. This could represent a small complex cystic structure and not confidently identified on the prior imaging. This area could be further evaluated with a non-emergent MRI.   Electronically Signed   By: Markus Daft M.D.   On: 04/06/2014 08:07   Nm Pulmonary Perf And Vent  04/05/2014   CLINICAL DATA:  Shortness of breath. Renal insufficiency. Unknown primary cancer.  EXAM: NUCLEAR MEDICINE VENTILATION - PERFUSION LUNG SCAN  TECHNIQUE: Ventilation images were obtained in multiple projections using inhaled aerosol technetium 99 M DTPA. Perfusion images were obtained in multiple projections after intravenous injection of Tc-49m MAA.  RADIOPHARMACEUTICALS:  40.0 MCi Tc-35m DTPA aerosol and 6.0 mCi Tc-83m MAA  COMPARISON:  Chest radiographs 04/05/2014, PET-CT 01/10/2014 and chest CT 12/31/2013.  FINDINGS: Ventilation: Ventilation images are limited by central clumping of the air cell. No focal ventilatory defects are identified, although some obstructive lung disease suspected.  Perfusion: No wedge shaped peripheral perfusion defects to suggest acute pulmonary embolism. Small peripheral perfusion defects likely represent chronic obstructive pulmonary disease. These appear matched with the ventilatory examination.  IMPRESSION: Low probability for acute pulmonary embolism. There are findings suggesting a degree of obstructive lung disease.   Electronically Signed   By: Camie Patience M.D.   On: 04/05/2014 23:54    Scheduled Meds: . allopurinol  300 mg Oral Daily  . gabapentin  300 mg Oral  QHS  . insulin aspart  0-5 Units Subcutaneous QHS  . insulin aspart  0-9 Units Subcutaneous TID WC  . levothyroxine  112 mcg Oral QAC breakfast  . lidocaine  2 patch Transdermal Q24H  . ondansetron  8 mg Oral TID AC  . pantoprazole  40 mg Oral Daily  . sodium chloride  3 mL Intravenous Q12H   Continuous Infusions: . heparin 1,650 Units/hr (04/07/14 1042)   Antibiotics Given (last 72 hours)   None      Principal  Problem:   Acute encephalopathy Active Problems:   DM type 2 (diabetes mellitus, type 2)   Unspecified hypothyroidism   SOB (shortness of breath)   ARF (acute renal failure)   Pulmonary embolism    Time spent: 35 min    Jarvis Sawa  [facility] Pager [phone]. If 7PM-7AM, please contact night-coverage at [email or web address], password [facility] 04/07/2014, 11:11 AM  LOS: 2 days

## 2014-04-08 ENCOUNTER — Encounter: Payer: Self-pay | Admitting: Family Medicine

## 2014-04-08 DIAGNOSIS — M25569 Pain in unspecified knee: Secondary | ICD-10-CM

## 2014-04-08 LAB — VITAMIN B12: Vitamin B-12: 668 pg/mL (ref 211–911)

## 2014-04-08 LAB — GLUCOSE, CAPILLARY
GLUCOSE-CAPILLARY: 189 mg/dL — AB (ref 70–99)
Glucose-Capillary: 172 mg/dL — ABNORMAL HIGH (ref 70–99)
Glucose-Capillary: 188 mg/dL — ABNORMAL HIGH (ref 70–99)
Glucose-Capillary: 249 mg/dL — ABNORMAL HIGH (ref 70–99)
Glucose-Capillary: 159 mg/dL — ABNORMAL HIGH (ref 70–99)

## 2014-04-08 LAB — BASIC METABOLIC PANEL
Anion gap: 11 (ref 5–15)
BUN: 34 mg/dL — ABNORMAL HIGH (ref 6–23)
CO2: 31 meq/L (ref 19–32)
Calcium: 8.9 mg/dL (ref 8.4–10.5)
Chloride: 98 mEq/L (ref 96–112)
Creatinine, Ser: 1.19 mg/dL — ABNORMAL HIGH (ref 0.50–1.10)
GFR calc Af Amer: 48 mL/min — ABNORMAL LOW (ref >90)
GFR calc non Af Amer: 41 mL/min — ABNORMAL LOW (ref >90)
GLUCOSE: 180 mg/dL — AB (ref 70–99)
Potassium: 4.6 mEq/L (ref 3.7–5.3)
SODIUM: 140 meq/L (ref 137–147)

## 2014-04-08 LAB — CBC
HEMATOCRIT: 31.1 % — AB (ref 36.0–46.0)
HEMOGLOBIN: 9.7 g/dL — AB (ref 12.0–15.0)
MCH: 29.1 pg (ref 26.0–34.0)
MCHC: 31.2 g/dL (ref 30.0–36.0)
MCV: 93.4 fL (ref 78.0–100.0)
Platelets: 305 10*3/uL (ref 150–400)
RBC: 3.33 MIL/uL — ABNORMAL LOW (ref 3.87–5.11)
RDW: 15.8 % — ABNORMAL HIGH (ref 11.5–15.5)
WBC: 9.3 10*3/uL (ref 4.0–10.5)

## 2014-04-08 LAB — APTT: APTT: 110 s — AB (ref 24–37)

## 2014-04-08 LAB — HEPARIN LEVEL (UNFRACTIONATED): Heparin Unfractionated: 1.12 IU/mL — ABNORMAL HIGH (ref 0.30–0.70)

## 2014-04-08 MED ORDER — FUROSEMIDE 40 MG PO TABS
40.0000 mg | ORAL_TABLET | Freq: Every day | ORAL | Status: DC
  Administered 2014-04-08 – 2014-04-10 (×3): 40 mg via ORAL
  Filled 2014-04-08 (×3): qty 1

## 2014-04-08 MED ORDER — TRAMADOL HCL 50 MG PO TABS: 50.0000 mg | ORAL_TABLET | Freq: Four times a day (QID) | ORAL | Status: DC | PRN

## 2014-04-08 MED ORDER — HEPARIN (PORCINE) IN NACL 100-0.45 UNIT/ML-% IJ SOLN: 1500.0000 [IU]/h | INTRAMUSCULAR | Status: AC

## 2014-04-08 MED ORDER — RIVAROXABAN 20 MG PO TABS
20.0000 mg | ORAL_TABLET | Freq: Every day | ORAL | Status: DC
  Administered 2014-04-08 – 2014-04-10 (×3): 20 mg via ORAL
  Filled 2014-04-08 (×3): qty 1

## 2014-04-08 NOTE — Progress Notes (Signed)
PROGRESS NOTE  Amber Knox UTM:[id number] DOB: 03/16/1930 DOA: 04/05/2014 PCP: Ival Bible, MD  Assessment/Plan: acute encephalopathy - suspect most likely secondary to medications in a patient with acute renal failure. -decreased patient's Neurontin dose by half given the renal failure and held tizanidine.  -Oxycodone is made PRN.  - ammonia levels ok  MRI brain ruled out any stroke.  - improved   Acute renal failure - cause not clear. Patient's diuretics were recently increased and a new one was added.  -discontined Zaroxolyn and Celebrex  -added lasix back- watch Cr   Hypoxia - patient has history of pulmonary embolism and was recently changed to xarelto -VQ scan low prob -Dopplers of the lower extremity for any PE or DVT negative. -Patient presently is not in respiratory distress.  -resume xarelto   History of PE -  xarelto    Diabetes mellitus - patient was recently started on Starlix and Victoza. While in the hospital patient will be on sliding-scale coverage and based on CBGs will be place on long-acting insulin.   Hypothyroidism - continue Synthroid.   History of gout - allopurinol  Hypertension.   LE edema -TED hose fitted as outpatient  Anemia - follow CBC  Malnutrition- encourage protein intake  Code Status: full Family Communication: patient and called daughter Disposition Plan: ALF   Consultants:    Procedures:      HPI/Subjective: Feeling well No SOB  Objective: Filed Vitals:   04/08/14 0736  BP: 129/58  Pulse: 78  Temp: 97.5 F (36.4 C)  Resp: 17    Intake/Output Summary (Last 24 hours) at 04/08/14 1108 Last data filed at 04/08/14 0928  Gross per 24 hour  Intake      0 ml  Output      1 ml  Net     -1 ml   Filed Weights   04/05/14 2121  Weight: 116.257 kg (256 lb 4.8 oz)    Exam:   General:  NAD-resting in chair  Cardiovascular: rrr  Respiratory: clear  Abdomen: +BS, soft  Musculoskeletal: +edema   Data  Reviewed: Basic Metabolic Panel:  Recent Labs Lab 04/05/14 1731 04/06/14 0633 04/07/14 0051 04/08/14 0046  NA 131* 132* 135* 140  K 5.5* 5.2 4.7 4.6  CL 91* 93* 96 98  CO2 26 29 30 31   GLUCOSE 203* 151* 183* 180*  BUN 55* 52* 41* 34*  CREATININE 2.32* 1.87* 1.49* 1.19*  CALCIUM 8.4 8.6 8.5 8.9   Liver Function Tests:  Recent Labs Lab 04/05/14 1731 04/06/14 0633  AST 20 15  ALT 15 13  ALKPHOS 66 60  BILITOT 0.3 0.4  PROT 6.3 5.8*  ALBUMIN 3.3* 3.0*   No results found for this basename: LIPASE, AMYLASE,  in the last 168 hours  Recent Labs Lab 04/05/14 1731  AMMONIA 17   CBC:  Recent Labs Lab 04/05/14 1731 04/06/14 0633 04/07/14 0051 04/08/14 0305  WBC 11.4* 9.6 8.3 9.3  NEUTROABS 6.5 5.2  --   --   HGB 11.1* 10.1* 9.5* 9.7*  HCT 35.4* 32.1* 31.0* 31.1*  MCV 92.4 92.2 93.1 93.4  PLT 318 298 290 305   Cardiac Enzymes:  Recent Labs Lab 04/05/14 1731  TROPONINI <0.30   BNP (last 3 results)  Recent Labs  01/02/14 0246 04/05/14 1731  PROBNP 4037.0* 711.3*   CBG:  Recent Labs Lab 04/07/14 0721 04/07/14 1123 04/07/14 1643 04/07/14 2113 04/08/14 0719  GLUCAP 161* 191* 192* 189* 172*    Recent Results (  from the past 240 hour(s))  MRSA PCR SCREENING     Status: None   Collection Time    04/05/14  9:41 PM      Result Value Ref Range Status   MRSA by PCR NEGATIVE  NEGATIVE Final   Comment:            The GeneXpert MRSA Assay (FDA     approved for NASAL specimens     only), is one component of a     comprehensive MRSA colonization     surveillance program. It is not     intended to diagnose MRSA     infection nor to guide or     monitor treatment for     MRSA infections.  URINE CULTURE     Status: None   Collection Time    04/06/14  9:20 AM      Result Value Ref Range Status   Specimen Description URINE, CLEAN CATCH   Final   Special Requests NONE   Final   Culture  Setup Time     Final   Value: 04/06/2014 10:05     Performed at  [facility]     Final   Value: 4,000 COLONIES/ML     Performed at [facility]   Culture     Final   Value: INSIGNIFICANT GROWTH     Performed at [facility]   Report Status 04/07/2014 FINAL   Final     Studies: Dg Hip Complete Right  04/07/2014   CLINICAL DATA:  Right hip pain.  EXAM: RIGHT HIP - COMPLETE 2+ VIEW  COMPARISON:  None.  FINDINGS: There is no evidence of fracture or dislocation. Both femoral heads are seated normally within their respective acetabula. The proximal right femur appears intact. Minimal degenerative change is noted at the lower lumbar spine. The sacroiliac joints are unremarkable in appearance.  The visualized bowel gas pattern is grossly unremarkable in appearance.  IMPRESSION: No evidence of fracture or dislocation.   Electronically Signed   By: Garald Balding M.D.   On: 04/07/2014 06:54    Scheduled Meds: . allopurinol  300 mg Oral Daily  . furosemide  40 mg Oral Q1200  . gabapentin  300 mg Oral QHS  . insulin aspart  0-5 Units Subcutaneous QHS  . insulin aspart  0-9 Units Subcutaneous TID WC  . levothyroxine  112 mcg Oral QAC breakfast  . lidocaine  2 patch Transdermal Q24H  . ondansetron  8 mg Oral TID AC  . pantoprazole  40 mg Oral Daily  . rivaroxaban  20 mg Oral Q lunch  . sodium chloride  3 mL Intravenous Q12H   Continuous Infusions: . heparin     Antibiotics Given (last 72 hours)   None      Principal Problem:   Acute encephalopathy Active Problems:   DM type 2 (diabetes mellitus, type 2)   Unspecified hypothyroidism   SOB (shortness of breath)   ARF (acute renal failure)   Pulmonary embolism    Time spent: 25 min    Jacob Cicero  [facility] Pager [phone]. If 7PM-7AM, please contact night-coverage at [email or web address], password [facility] 04/08/2014, 11:08 AM  LOS: 3 days

## 2014-04-08 NOTE — Progress Notes (Signed)
ANTICOAGULATION CONSULT NOTE - Initial Consult  Pharmacy Consult for Heparin  Indication: hx of PE  Allergies  Allergen Reactions  . Ace Inhibitors Cough  . Byetta 10 Mcg Pen [Exenatide] Other (See Comments)    Pancreatitis 02/2005  . Beta Adrenergic Blockers Swelling    Tongue swelling    Patient Measurements: Height: 4' 11.84" (152 cm) Weight: 256 lb 4.8 oz (116.257 kg) IBW/kg (Calculated) : 45.14 Heparin Dosing Weight: ~75 kg  Vital Signs: Temp: 98.5 F (36.9 C) (07/25 2351) Temp src: Oral (07/25 2351) BP: 127/45 mmHg (07/25 2351) Pulse Rate: 79 (07/25 2351)  Labs:  Recent Labs  04/05/14 1731 04/05/14 2150 04/06/14 0633  04/07/14 0051 04/07/14 0940 04/07/14 1840 04/08/14 0046 04/08/14 0305  HGB 11.1*  --  10.1*  --  9.5*  --   --   --  9.7*  HCT 35.4*  --  32.1*  --  31.0*  --   --   --  31.1*  PLT 318  --  298  --  290  --   --   --  305  APTT  --  32  --   < > 54* 64* 73*  --  110*  LABPROT  --  25.0*  --   --   --   --   --   --   --   INR  --  2.26*  --   --   --   --   --   --   --   HEPARINUNFRC  --  >2.20*  --   < > 0.98* 0.88* 0.91*  --  1.12*  CREATININE 2.32*  --  1.87*  --  1.49*  --   --  1.19*  --   TROPONINI <0.30  --   --   --   --   --   --   --   --   < > = values in this interval not displayed.  Medical History: Past Medical History  Diagnosis Date  . DM type 2 (diabetes mellitus, type 2)   . Hyperlipemia   . Hypertension   . Osteopenia     Lumbar spine  . Hx of osteoarthritis     bilateral knee osteoarthritis  -   bilateral knee replacement  . Colon polyps     Tubular Adenoma  . Diverticulosis     patient unaware   . Peripheral neuropathy   . Hiatal hernia   . Heart murmur     no need to follow  . GERD (gastroesophageal reflux disease)     not on meds, tums prn  . Arthritis   . Hypothyroidism   . Gout   . DVT (deep venous thrombosis) 12-31-13  . Pulmonary embolus 12-31-13    Medications:  Xarelto PTA: last dose 7/23 at  0800  Assessment: 80 y/o F with hx of PE on Xarelto here with AMS, low O2 sats. Concern for another acute PE, so VQ was done which showed low probability for acute PE. Baseline anti-coags have been done. Other labs as above. Given acute renal failure, on heparin. APTT high at 110 and HL 1.12 (still appears to be some Xarelto influence on HL, will continue with aPTT for now). No issues per RN.   Goal of Therapy:  Heparin level 0.3-0.7 units/ml aPTT 66-102 seconds Monitor platelets by anticoagulation protocol: Yes   Plan:  -Decrease heparin to 1500 units/hr  -1400 aPTT/HL -Daily CBC/HL/aPTT -Monitor for bleeding  Timmya Blazier,  Annisa Mazzarella 04/08/2014,5:43 AM

## 2014-04-08 NOTE — Clinical Social Work Note (Signed)
CSW continues to follow this patient for d/c planning needs. CSW met with patient's daughter Luis Abed, granddaughter, and other family present in the room. Per daughter, she has spoken with Otila Kluver at [facility] (SNF) in [place] and d/c plan is for patient to self pay for bed on "rehab" side of facility. Patient is agreeable to d/c. Daughter stated she will meet with admissions coordinator on Monday 7/27 to complete paperwork. Per daughter, she will transport patient to facility once d/c. CSW contacted [facility] [phone] and spoke with Freddi Starr confirmed meeting with family on Monday and patient will be self-pay. CSW faxed clinicals to [facility]. CSW to follow tomorrow.   [place], [place] Weekend Clinical Social Worker [phone]

## 2014-04-08 NOTE — Progress Notes (Signed)
ANTICOAGULATION CONSULT NOTE - Initial Consult  Pharmacy Consult for xarelto Indication: hx of PE  Allergies  Allergen Reactions  . Ace Inhibitors Swelling    Laryngeal Edema   . Beta Adrenergic Blockers Swelling    Tongue swelling     Patient Measurements: Height: 4' 11.84" (152 cm) Weight: 256 lb 4.8 oz (116.257 kg) IBW/kg (Calculated) : 45.14 Heparin Dosing Weight:   Vital Signs: Temp: 97.5 F (36.4 C) (07/26 0736) Temp src: Oral (07/26 0736) BP: 129/58 mmHg (07/26 0736) Pulse Rate: 78 (07/26 0736)  Labs:  Recent Labs  04/05/14 1731 04/05/14 2150 04/06/14 8250  04/07/14 0051 04/07/14 0940 04/07/14 1840 04/08/14 0046 04/08/14 0305  HGB 11.1*  --  10.1*  --  9.5*  --   --   --  9.7*  HCT 35.4*  --  32.1*  --  31.0*  --   --   --  31.1*  PLT 318  --  298  --  290  --   --   --  305  APTT  --  32  --   < > 54* 64* 73*  --  110*  LABPROT  --  25.0*  --   --   --   --   --   --   --   INR  --  2.26*  --   --   --   --   --   --   --   HEPARINUNFRC  --  >2.20*  --   < > 0.98* 0.88* 0.91*  --  1.12*  CREATININE 2.32*  --  1.87*  --  1.49*  --   --  1.19*  --   TROPONINI <0.30  --   --   --   --   --   --   --   --   < > = values in this interval not displayed.  Estimated Creatinine Clearance: 42.3 ml/min (by C-G formula based on Cr of 1.19).   Medical History: Past Medical History  Diagnosis Date  . DM type 2 (diabetes mellitus, type 2)   . Hyperlipemia   . Hypertension   . Osteopenia     Lumbar spine  . Hx of osteoarthritis     bilateral knee osteoarthritis  -   bilateral knee replacement  . Colon polyps     Tubular Adenoma  . Diverticulosis   . Peripheral neuropathy   . Hiatal hernia   . Heart murmur   . GERD (gastroesophageal reflux disease)   . Hypothyroidism   . Gout   . DVT (deep venous thrombosis) 12-31-13  . Pulmonary embolus 12-31-13  . Gallstone     Medications:  Scheduled:  . allopurinol  300 mg Oral Daily  . furosemide  40 mg Oral  Q1200  . gabapentin  300 mg Oral QHS  . insulin aspart  0-5 Units Subcutaneous QHS  . insulin aspart  0-9 Units Subcutaneous TID WC  . levothyroxine  112 mcg Oral QAC breakfast  . lidocaine  2 patch Transdermal Q24H  . ondansetron  8 mg Oral TID AC  . pantoprazole  40 mg Oral Daily  . rivaroxaban  20 mg Oral Q lunch  . sodium chloride  3 mL Intravenous Q12H   Infusions:  . heparin      Assessment: 80 yo female with hx of PE will be transitioned back to Xarelto from heparin.  CrCl ~42.3 (better than admission) Goal of Therapy:   Monitor  platelets by anticoagulation protocol: Yes   Plan:  1) d/c heparin and heparin level 2) Transition to xarelto 20mg  po daily 3) Monitor renal function and signs of bleeding  Chianna Spirito, Tsz-Yin 04/08/2014,10:57 AM

## 2014-04-09 DIAGNOSIS — I10 Essential (primary) hypertension: Secondary | ICD-10-CM

## 2014-04-09 LAB — CBC
HEMATOCRIT: 36.4 % (ref 36.0–46.0)
Hemoglobin: 11.1 g/dL — ABNORMAL LOW (ref 12.0–15.0)
MCH: 29.1 pg (ref 26.0–34.0)
MCHC: 30.5 g/dL (ref 30.0–36.0)
MCV: 95.5 fL (ref 78.0–100.0)
PLATELETS: 329 10*3/uL (ref 150–400)
RBC: 3.81 MIL/uL — ABNORMAL LOW (ref 3.87–5.11)
RDW: 15.8 % — AB (ref 11.5–15.5)
WBC: 10.3 10*3/uL (ref 4.0–10.5)

## 2014-04-09 LAB — BASIC METABOLIC PANEL
Anion gap: 8 (ref 5–15)
BUN: 30 mg/dL — ABNORMAL HIGH (ref 6–23)
CHLORIDE: 96 meq/L (ref 96–112)
CO2: 36 meq/L — AB (ref 19–32)
CREATININE: 1.11 mg/dL — AB (ref 0.50–1.10)
Calcium: 9.6 mg/dL (ref 8.4–10.5)
GFR calc Af Amer: 52 mL/min — ABNORMAL LOW (ref >90)
GFR calc non Af Amer: 45 mL/min — ABNORMAL LOW (ref >90)
Glucose, Bld: 164 mg/dL — ABNORMAL HIGH (ref 70–99)
Potassium: 5.2 mEq/L (ref 3.7–5.3)
Sodium: 140 mEq/L (ref 137–147)

## 2014-04-09 LAB — GLUCOSE, CAPILLARY
GLUCOSE-CAPILLARY: 172 mg/dL — AB (ref 70–99)
Glucose-Capillary: 173 mg/dL — ABNORMAL HIGH (ref 70–99)
Glucose-Capillary: 194 mg/dL — ABNORMAL HIGH (ref 70–99)
Glucose-Capillary: 199 mg/dL — ABNORMAL HIGH (ref 70–99)

## 2014-04-09 LAB — FOLATE RBC: RBC Folate: >1000 ng/mL — ABNORMAL HIGH (ref >280)

## 2014-04-09 MED ORDER — ACETAMINOPHEN 325 MG PO TABS: 650.0000 mg | ORAL_TABLET | ORAL | Status: DC | PRN

## 2014-04-09 MED ORDER — OXYCODONE HCL 5 MG PO TABS: 5.0000 mg | ORAL_TABLET | Freq: Three times a day (TID) | ORAL | Status: DC | PRN

## 2014-04-09 MED ORDER — INSULIN ASPART 100 UNIT/ML ~~LOC~~ SOLN: 0.0000 [IU] | Freq: Every day | SUBCUTANEOUS | Status: DC

## 2014-04-09 MED ORDER — TRAMADOL HCL 50 MG PO TABS: 50.0000 mg | ORAL_TABLET | Freq: Four times a day (QID) | ORAL | Status: DC | PRN

## 2014-04-09 MED ORDER — INSULIN ASPART 100 UNIT/ML ~~LOC~~ SOLN
0.0000 [IU] | Freq: Three times a day (TID) | SUBCUTANEOUS | Status: DC
Start: 2014-04-09 — End: 2014-04-10

## 2014-04-09 MED ORDER — GABAPENTIN 300 MG PO CAPS: 300.0000 mg | ORAL_CAPSULE | Freq: Every day | ORAL | Status: DC

## 2014-04-09 MED ORDER — NATEGLINIDE 120 MG PO TABS
120.0000 mg | ORAL_TABLET | Freq: Three times a day (TID) | ORAL | Status: DC
  Filled 2014-04-09 (×4): qty 1

## 2014-04-09 NOTE — Progress Notes (Signed)
PROGRESS NOTE  Amber Knox DJT:[id number] DOB: [birth year] DOA: 04/05/2014 PCP: Ival Bible, MD  Assessment/Plan: acute encephalopathy - suspect most likely secondary to medications in a patient with acute renal failure. -decreased patient's Neurontin dose by half given the renal failure and held tizanidine.  -Oxycodone is made PRN.  -will try ultram 1st -patient has pain management appointment soon - ammonia levels ok  MRI brain ruled out any stroke.  - improved   Acute renal failure - cause not clear. Patient's diuretics were recently increased and a new one was added.  -discontined Zaroxolyn and Celebrex  -added lasix back- watch Cr- await today's labs   Hypoxia - patient has history of pulmonary embolism and was recently changed to xarelto -VQ scan low prob -Dopplers of the lower extremity for any PE or DVT negative. -Patient presently is not in respiratory distress.  -resume xarelto   History of PE -  xarelto    Diabetes mellitus - patient was recently started on Starlix and Victoza. While in the hospital patient will be on sliding-scale coverage and based on CBGs will be place on long-acting insulin.   Hypothyroidism - continue Synthroid.   History of gout - allopurinol  Hypertension.   LE edema -TED hose fitted as outpatient  Anemia - follow CBC  Malnutrition- encourage protein intake  Code Status: full Family Communication: patient and called daughter Disposition Plan: SNF in AM- letter of necessity given to daughter   Consultants:    Procedures:      HPI/Subjective: No overnight events Pain controlled at baseline  Objective: Filed Vitals:   04/09/14 0737  BP: 149/65  Pulse: 74  Temp: 98.2 F (36.8 C)  Resp: 16    Intake/Output Summary (Last 24 hours) at 04/09/14 1033 Last data filed at 04/09/14 0600  Gross per 24 hour  Intake    120 ml  Output   1200 ml  Net  -1080 ml   Filed Weights   04/05/14 2121 04/09/14 0438  Weight:  116.257 kg (256 lb 4.8 oz) 110.542 kg (243 lb 11.2 oz)    Exam:   General:  NAD-resting in chair  Cardiovascular: rrr  Respiratory: clear  Abdomen: +BS, soft  Musculoskeletal: +edema   Data Reviewed: Basic Metabolic Panel:  Recent Labs Lab 04/05/14 1731 04/06/14 0633 04/07/14 0051 04/08/14 0046  NA 131* 132* 135* 140  K 5.5* 5.2 4.7 4.6  CL 91* 93* 96 98  CO2 26 29 30 31   GLUCOSE 203* 151* 183* 180*  BUN 55* 52* 41* 34*  CREATININE 2.32* 1.87* 1.49* 1.19*  CALCIUM 8.4 8.6 8.5 8.9   Liver Function Tests:  Recent Labs Lab 04/05/14 1731 04/06/14 0633  AST 20 15  ALT 15 13  ALKPHOS 66 60  BILITOT 0.3 0.4  PROT 6.3 5.8*  ALBUMIN 3.3* 3.0*   No results found for this basename: LIPASE, AMYLASE,  in the last 168 hours  Recent Labs Lab 04/05/14 1731  AMMONIA 17   CBC:  Recent Labs Lab 04/05/14 1731 04/06/14 0633 04/07/14 0051 04/08/14 0305 04/09/14 0427  WBC 11.4* 9.6 8.3 9.3 10.3  NEUTROABS 6.5 5.2  --   --   --   HGB 11.1* 10.1* 9.5* 9.7* 11.1*  HCT 35.4* 32.1* 31.0* 31.1* 36.4  MCV 92.4 92.2 93.1 93.4 95.5  PLT 318 298 290 305 329   Cardiac Enzymes:  Recent Labs Lab 04/05/14 1731  TROPONINI <0.30   BNP (last 3 results)  Recent Labs  01/02/14 0246 04/05/14  1731  PROBNP 4037.0* 711.3*   CBG:  Recent Labs Lab 04/08/14 0719 04/08/14 1123 04/08/14 1624 04/08/14 2121 04/09/14 0739  GLUCAP 172* 188* 249* 159* 173*    Recent Results (from the past 240 hour(s))  MRSA PCR SCREENING     Status: None   Collection Time    04/05/14  9:41 PM      Result Value Ref Range Status   MRSA by PCR NEGATIVE  NEGATIVE Final   Comment:            The GeneXpert MRSA Assay (FDA     approved for NASAL specimens     only), is one component of a     comprehensive MRSA colonization     surveillance program. It is not     intended to diagnose MRSA     infection nor to guide or     monitor treatment for     MRSA infections.  URINE CULTURE      Status: None   Collection Time    04/06/14  9:20 AM      Result Value Ref Range Status   Specimen Description URINE, CLEAN CATCH   Final   Special Requests NONE   Final   Culture  Setup Time     Final   Value: 04/06/2014 10:05     Performed at [place]     Final   Value: 4,000 COLONIES/ML     Performed at [facility]   Culture     Final   Value: INSIGNIFICANT GROWTH     Performed at [facility]   Report Status 04/07/2014 FINAL   Final     Studies: No results found.  Scheduled Meds: . allopurinol  300 mg Oral Daily  . furosemide  40 mg Oral Q1200  . gabapentin  300 mg Oral QHS  . insulin aspart  0-5 Units Subcutaneous QHS  . insulin aspart  0-9 Units Subcutaneous TID WC  . levothyroxine  112 mcg Oral QAC breakfast  . lidocaine  2 patch Transdermal Q24H  . ondansetron  8 mg Oral TID AC  . pantoprazole  40 mg Oral Daily  . rivaroxaban  20 mg Oral Q lunch  . sodium chloride  3 mL Intravenous Q12H   Continuous Infusions:   Antibiotics Given (last 72 hours)   None      Principal Problem:   Acute encephalopathy Active Problems:   DM type 2 (diabetes mellitus, type 2)   Unspecified hypothyroidism   SOB (shortness of breath)   ARF (acute renal failure)   Pulmonary embolism    Time spent: 25 min    VANN, JESSICA  [facility] Pager [phone]. If 7PM-7AM, please contact night-coverage at [email or web address], password [facility] 04/09/2014, 10:33 AM  LOS: 4 days

## 2014-04-09 NOTE — Discharge Summary (Signed)
Physician Discharge Summary  Amber Knox IOX:[id number] DOB: 12/03/1929 DOA: 04/05/2014  PCP: Ival Bible, MD  Admit date: 04/05/2014 Discharge date: 04/09/2014  Time spent: 35 minutes  Recommendations for Outpatient Follow-up:  1. BMp/CBC 1 week 2. Use ultram first for pain-- try to limit oxycodone 3. Fit for compression hose  Discharge Diagnoses:  Principal Problem:   Acute encephalopathy Active Problems:   DM type 2 (diabetes mellitus, type 2)   Unspecified hypothyroidism   SOB (shortness of breath)   ARF (acute renal failure)   Pulmonary embolism   Discharge Condition: improved  Diet recommendation: cardiac/diabetic  Filed Weights   04/05/14 2121 04/09/14 0438  Weight: 116.257 kg (256 lb 4.8 oz) 110.542 kg (243 lb 11.2 oz)    History of present illness:  Amber Knox is a 80 y.o. female history of pulmonary embolism, diabetes mellitus, hypertension, hypothyroidism and gout who has had surgery of the low back 2 months ago was brought to the ER from assisted living facility after patient was found to be confused. As per the patient's family patient was having some nausea earlier today and was given Phenergan. When patient became more confused and had some slurred speech and was talking out of her head. Patient was brought to the ER and was found to be confused. Patient's labs also shows acute worsening of her renal function. Patient also was found to be hypoxic with mild shortness of breath. CT of the head was negative for any acute. Patient's family states that patient has been found to have increasing lower extremity edema and was recently placed on metolazone. Patient is already on Lasix. Patient also was being tried to be weaned off oxycodone and was placed on Celebrex. On my exam patient is able to follow commands and moves all extremities but at times patient was talking out off context. Abdomen is benign on exam. Patient is not in acute distress. Patient still has right  lower extremity pain. Patient has bilateral lower extremity edema. Is able to move all extremities.   Hospital Course:  acute encephalopathy - suspect most likely secondary to medications in a patient with acute renal failure.  -decreased patient's Neurontin dose by half given the renal failure and held tizanidine.  -Oxycodone is made PRN.  -will try ultram 1st and limit sedating meds -patient has pain management appointment soon  - ammonia levels ok  MRI brain ruled out any stroke.  - improved   Acute renal failure - cause not clear. Patient's diuretics were recently increased and a new one was added.  -discontined Zaroxolyn and Celebrex  -added lasix back- -watch Cr outpatient   Hypoxia - patient has history of pulmonary embolism and was recently changed to xarelto  -VQ scan low prob  -Dopplers of the lower extremity for any PE or DVT negative.  -Patient presently is not in respiratory distress.- wean off O2 -resume xarelto   History of PE - xarelto   Diabetes mellitus - patient was recently started on Starlix and Victoza. SSI and starlix  Hypothyroidism - continue Synthroid.   History of gout - allopurinol   Hypertension.  -resume ACE if next Cr as outpatient is stable  LE edema  -TED hose fitted as outpatient  -lasix  Anemia -  stable  Malnutrition- encourage protein intake  Letter of SNF necessity given to patient  Procedures:  EEG  MRI  Consultations:  none  Discharge Exam: Filed Vitals:   04/09/14 1652  BP: 150/61  Pulse: 83  Temp:   Resp: 17    General: A+Ox3, NAD Cardiovascular: rrr Respiratory: clear  Discharge Instructions You were cared for by a hospitalist during your hospital stay. If you have any questions about your discharge medications or the care you received while you were in the hospital after you are discharged, you can call the unit and asked to speak with the hospitalist on call if the hospitalist that took care of you is  not available. Once you are discharged, your primary care physician will handle any further medical issues. Please note that NO REFILLS for any discharge medications will be authorized once you are discharged, as it is imperative that you return to your primary care physician (or establish a relationship with a primary care physician if you do not have one) for your aftercare needs so that they can reassess your need for medications and monitor your lab values.  Discharge Instructions   Diet - low sodium heart healthy    Complete by:  As directed      Diet Carb Modified    Complete by:  As directed      Discharge instructions    Complete by:  As directed   Cbc, bmp 1 week     Increase activity slowly    Complete by:  As directed             Medication List    STOP taking these medications       celecoxib 200 MG capsule  Commonly known as:  CELEBREX     irbesartan 300 MG tablet  Commonly known as:  AVAPRO     metolazone 5 MG tablet  Commonly known as:  ZAROXOLYN     potassium chloride SA 20 MEQ tablet  Commonly known as:  K-DUR,KLOR-CON     promethazine 12.5 MG tablet  Commonly known as:  PHENERGAN     tiZANidine 4 MG tablet  Commonly known as:  ZANAFLEX      TAKE these medications       acetaminophen 325 MG tablet  Commonly known as:  TYLENOL  Take 2 tablets (650 mg total) by mouth every 4 (four) hours as needed for mild pain (temperature >/= 99.5 F).     allopurinol 300 MG tablet  Commonly known as:  ZYLOPRIM  Take 1 tablet (300 mg total) by mouth daily.     dexlansoprazole 60 MG capsule  Commonly known as:  DEXILANT  Take 1 capsule (60 mg total) by mouth daily.     furosemide 40 MG tablet  Commonly known as:  LASIX  Take 1 tablet (40 mg total) by mouth daily at 12 noon.     gabapentin 300 MG capsule  Commonly known as:  NEURONTIN  Take 1 capsule (300 mg total) by mouth at bedtime.     HEMOCYTE PLUS 106-1 MG Caps  Take 1 capsule by mouth daily.      insulin aspart 100 UNIT/ML injection  Commonly known as:  novoLOG  Inject 0-5 Units into the skin at bedtime.     insulin aspart 100 UNIT/ML injection  Commonly known as:  novoLOG  Inject 0-9 Units into the skin 3 (three) times daily with meals.     levothyroxine 112 MCG tablet  Commonly known as:  SYNTHROID, LEVOTHROID  Take 1 tablet (112 mcg total) by mouth daily.     LIDODERM 5 %  Generic drug:  lidocaine  Place 2 patches onto the skin daily. Remove & Discard patch within 12  hours or as directed by MD     magnesium hydroxide 400 MG/5ML suspension  Commonly known as:  MILK OF MAGNESIA  Take 30 mLs by mouth daily as needed for mild constipation.     MI-ACID 200-200-20 MG/5ML suspension  Generic drug:  alum & mag hydroxide-simeth  Take 10 mLs by mouth every 6 (six) hours as needed for indigestion or heartburn.     nateglinide 120 MG tablet  Commonly known as:  STARLIX  Take 120 mg by mouth 3 (three) times daily with meals.     ondansetron 8 MG disintegrating tablet  Commonly known as:  ZOFRAN-ODT  Take 8 mg by mouth 3 (three) times daily before meals.     oxyCODONE 5 MG immediate release tablet  Commonly known as:  Oxy IR/ROXICODONE  Take 1 tablet (5 mg total) by mouth every 8 (eight) hours as needed for severe pain.     polyethylene glycol packet  Commonly known as:  MIRALAX / GLYCOLAX  Take 17 g by mouth daily as needed for mild constipation.     rivaroxaban 20 MG Tabs tablet  Commonly known as:  XARELTO  Take 20 mg by mouth daily with supper.     traMADol 50 MG tablet  Commonly known as:  ULTRAM  Take 1 tablet (50 mg total) by mouth every 6 (six) hours as needed for moderate pain.      ASK your doctor about these medications       Liraglutide 18 MG/3ML Sopn  Inject 0.6 mg into the skin at bedtime.       Allergies  Allergen Reactions  . Ace Inhibitors Swelling    Laryngeal Edema   . Beta Adrenergic Blockers Swelling    Tongue swelling       The  results of significant diagnostics from this hospitalization (including imaging, microbiology, ancillary and laboratory) are listed below for reference.    Significant Diagnostic Studies: Dg Chest 2 View  04/05/2014   CLINICAL DATA:  Altered mental status.  EXAM: CHEST  2 VIEW  COMPARISON:  CT.  08/2014.  Chest x-ray 01/01/2014.  FINDINGS: Mediastinum and hilar structures stable. Stable right pulmonary artery prominence. Lungs are clear. Heart size normal. No pleural effusion or pneumothorax. No acute osseus abnormality.  IMPRESSION: No active cardiopulmonary disease.   Electronically Signed   By: Marcello Moores  Register   On: 04/05/2014 18:07   Dg Hip Complete Right  04/07/2014   CLINICAL DATA:  Right hip pain.  EXAM: RIGHT HIP - COMPLETE 2+ VIEW  COMPARISON:  None.  FINDINGS: There is no evidence of fracture or dislocation. Both femoral heads are seated normally within their respective acetabula. The proximal right femur appears intact. Minimal degenerative change is noted at the lower lumbar spine. The sacroiliac joints are unremarkable in appearance.  The visualized bowel gas pattern is grossly unremarkable in appearance.  IMPRESSION: No evidence of fracture or dislocation.   Electronically Signed   By: Garald Balding M.D.   On: 04/07/2014 06:54   Ct Head Wo Contrast  04/05/2014   CLINICAL DATA:  Altered mental status.  EXAM: CT HEAD WITHOUT CONTRAST  TECHNIQUE: Contiguous axial images were obtained from the base of the skull through the vertex without intravenous contrast.  COMPARISON:  PET-CT 01/10/2014.  FINDINGS: No mass. No hydrocephalus. No hemorrhage. White matter changes consistent with chronic ischemia. Dural type calcification noted along the right cerebellar hemisphere. This was slightly identified on prior PET CT. No acute bony abnormality. Mucous retention  cyst both maxillary sinuses.  IMPRESSION: White matter changes consistent chronic ischemia. No acute abnormality.   Electronically Signed    By: Marcello Moores  Register   On: 04/05/2014 18:24   Mri Brain Without Contrast  04/06/2014   CLINICAL DATA:  Altered mental status  EXAM: MRI HEAD WITHOUT CONTRAST  TECHNIQUE: Multiplanar, multiecho pulse sequences of the brain and surrounding structures were obtained without intravenous contrast.  COMPARISON:  CT head 04/05/2014  FINDINGS: Ventricle size is normal. Cerebral volume normal for age. Craniocervical junction normal. Pituitary normal in size.  Patchy hyperintensity in the cerebral white matter bilaterally and in the pons bilaterally consistent with chronic microvascular ischemia.  Negative for acute infarct.  Negative for hemorrhage or mass.  No edema or midline shift.  Paranasal sinuses reveal mild mucosal edema in the right sphenoid sinus and in the maxillary sinuses bilaterally.  IMPRESSION: Chronic microvascular ischemia.  No acute intracranial abnormality.   Electronically Signed   By: Franchot Gallo M.D.   On: 04/06/2014 08:32   US Abdomen Complete  04/06/2014   CLINICAL DATA:  Acute renal failure with nausea. History of cholecystectomy.  EXAM: ULTRASOUND ABDOMEN COMPLETE  COMPARISON:  PET CT 01/10/2014 and abdominal MR 07/22/2009  FINDINGS: Gallbladder:  Removed.  Common bile duct:  Diameter: 0.6 cm  Liver:  No focal lesion identified. Within normal limits in parenchymal echogenicity.  IVC:  No abnormality visualized.  Pancreas:  Main pancreatic duct roughly measures 0.3 cm. There is a round hypoechoic structure in the pancreatic body region that roughly measures up to 1.0 cm. This structure is indeterminate and not clearly identified on the prior imaging.  Spleen:  Spleen is small and poorly characterized.  Right Kidney:  Length: 11.6 cm. There is mild cortical thinning without hydronephrosis. Hypoechoic structure in the mid right kidney measures up to 1.2 cm and suggestive for a cyst. Anechoic structure in the lower pole measures up to 3.0 cm and compatible with a cyst.  Left Kidney:  Length:  13.0 cm. There is a round anechoic structure in the left kidney upper pole with posterior acoustic enhancement. This is compatible with a cyst that measures up to 7.5 cm. There are additional left renal cysts. There is a prominent cyst in the lower pole that measures roughly 5.4 cm. The left kidney parenchyma is diffusely echogenic and poor differentiation between the cortex and medullary region. No evidence for hydronephrosis.  Abdominal aorta:  No aneurysm visualized.  Other findings:  None.  IMPRESSION: Negative for hydronephrosis.  Bilateral renal cysts. Left kidney is echogenic and there is cortical thinning in the right kidney. Findings suggest underlying medical renal disease.  Indeterminate 1.0 cm hypoechoic structure within the pancreas. This could represent a small complex cystic structure and not confidently identified on the prior imaging. This area could be further evaluated with a non-emergent MRI.   Electronically Signed   By: Markus Daft M.D.   On: 04/06/2014 08:07   Nm Pulmonary Perf And Vent  04/05/2014   CLINICAL DATA:  Shortness of breath. Renal insufficiency. Unknown primary cancer.  EXAM: NUCLEAR MEDICINE VENTILATION - PERFUSION LUNG SCAN  TECHNIQUE: Ventilation images were obtained in multiple projections using inhaled aerosol technetium 99 M DTPA. Perfusion images were obtained in multiple projections after intravenous injection of Tc-7m MAA.  RADIOPHARMACEUTICALS:  40.0 MCi Tc-48m DTPA aerosol and 6.0 mCi Tc-46m MAA  COMPARISON:  Chest radiographs 04/05/2014, PET-CT 01/10/2014 and chest CT 12/31/2013.  FINDINGS: Ventilation: Ventilation images are limited by central clumping of  the air cell. No focal ventilatory defects are identified, although some obstructive lung disease suspected.  Perfusion: No wedge shaped peripheral perfusion defects to suggest acute pulmonary embolism. Small peripheral perfusion defects likely represent chronic obstructive pulmonary disease. These appear matched  with the ventilatory examination.  IMPRESSION: Low probability for acute pulmonary embolism. There are findings suggesting a degree of obstructive lung disease.   Electronically Signed   By: Camie Patience M.D.   On: 04/05/2014 23:54    Microbiology: Recent Results (from the past 240 hour(s))  MRSA PCR SCREENING     Status: None   Collection Time    04/05/14  9:41 PM      Result Value Ref Range Status   MRSA by PCR NEGATIVE  NEGATIVE Final   Comment:            The GeneXpert MRSA Assay (FDA     approved for NASAL specimens     only), is one component of a     comprehensive MRSA colonization     surveillance program. It is not     intended to diagnose MRSA     infection nor to guide or     monitor treatment for     MRSA infections.  URINE CULTURE     Status: None   Collection Time    04/06/14  9:20 AM      Result Value Ref Range Status   Specimen Description URINE, CLEAN CATCH   Final   Special Requests NONE   Final   Culture  Setup Time     Final   Value: 04/06/2014 10:05     Performed at [facility]     Final   Value: 4,000 COLONIES/ML     Performed at [facility]   Culture     Final   Value: INSIGNIFICANT GROWTH     Performed at [facility]   Report Status 04/07/2014 FINAL   Final     Labs: Basic Metabolic Panel:  Recent Labs Lab 04/05/14 1731 04/06/14 0633 04/07/14 0051 04/08/14 0046 04/09/14 0427  NA 131* 132* 135* 140 140  K 5.5* 5.2 4.7 4.6 5.2  CL 91* 93* 96 98 96  CO2 26 29 30 31  36*  GLUCOSE 203* 151* 183* 180* 164*  BUN 55* 52* 41* 34* 30*  CREATININE 2.32* 1.87* 1.49* 1.19* 1.11*  CALCIUM 8.4 8.6 8.5 8.9 9.6   Liver Function Tests:  Recent Labs Lab 04/05/14 1731 04/06/14 0633  AST 20 15  ALT 15 13  ALKPHOS 66 60  BILITOT 0.3 0.4  PROT 6.3 5.8*  ALBUMIN 3.3* 3.0*   No results found for this basename: LIPASE, AMYLASE,  in the last 168 hours  Recent Labs Lab 04/05/14 1731  AMMONIA 17    CBC:  Recent Labs Lab 04/05/14 1731 04/06/14 0633 04/07/14 0051 04/08/14 0305 04/09/14 0427  WBC 11.4* 9.6 8.3 9.3 10.3  NEUTROABS 6.5 5.2  --   --   --   HGB 11.1* 10.1* 9.5* 9.7* 11.1*  HCT 35.4* 32.1* 31.0* 31.1* 36.4  MCV 92.4 92.2 93.1 93.4 95.5  PLT 318 298 290 305 329   Cardiac Enzymes:  Recent Labs Lab 04/05/14 1731  TROPONINI <0.30   BNP: BNP (last 3 results)  Recent Labs  01/02/14 0246 04/05/14 1731  PROBNP 4037.0* 711.3*   CBG:  Recent Labs Lab 04/08/14 1624 04/08/14 2121 04/09/14 0739 04/09/14 1154 04/09/14 1653  GLUCAP 249* 159*  173* 194* 199*       Signed:  Jomes Giraldo  [facility] 04/09/2014, 5:31 PM

## 2014-04-09 NOTE — Clinical Social Work Note (Signed)
Clinical Social Worker continuing to follow patient and family for support and discharge planning needs.  CSW spoke with patient daughter over the phone who states that patient plans to go to [facility] in [place] once medically ready for discharge.  CSW spoke with admissions coordinator, Sharyn Lull, who is agreeable with patient admission.  Patient family is scheduled to complete facility paperwork today and prepared to provide transport for patient at discharge tomorrow.    Patient daughter requested documentation for patient current ALF Actuary) to receive reimbursement for August payment due to patient need for higher level of care - letter on shadow chart to be signed prior to discharge.  Patient daughter verbalized appreciation for continued communication and support.  CSW remains available for support and to facilitate patient discharge needs once medically stable.  Barbette Or, [place]

## 2014-04-10 LAB — CBC
HEMATOCRIT: 33.6 % — AB (ref 36.0–46.0)
Hemoglobin: 10.5 g/dL — ABNORMAL LOW (ref 12.0–15.0)
MCH: 29.2 pg (ref 26.0–34.0)
MCHC: 31.3 g/dL (ref 30.0–36.0)
MCV: 93.3 fL (ref 78.0–100.0)
Platelets: 298 10*3/uL (ref 150–400)
RBC: 3.6 MIL/uL — ABNORMAL LOW (ref 3.87–5.11)
RDW: 15.7 % — AB (ref 11.5–15.5)
WBC: 9.8 10*3/uL (ref 4.0–10.5)

## 2014-04-10 LAB — GLUCOSE, CAPILLARY
Glucose-Capillary: 195 mg/dL — ABNORMAL HIGH (ref 70–99)
Glucose-Capillary: 170 mg/dL — ABNORMAL HIGH (ref 70–99)

## 2014-04-10 MED ORDER — INSULIN ASPART 100 UNIT/ML ~~LOC~~ SOLN: 0.0000 [IU] | Freq: Three times a day (TID) | SUBCUTANEOUS | Status: DC

## 2014-04-10 MED ORDER — ONDANSETRON 8 MG PO TBDP: 8.0000 mg | ORAL_TABLET | Freq: Three times a day (TID) | ORAL | Status: DC | PRN

## 2014-04-10 MED ORDER — INSULIN ASPART 100 UNIT/ML ~~LOC~~ SOLN: 0.0000 [IU] | Freq: Every day | SUBCUTANEOUS | Status: DC

## 2014-04-10 NOTE — Discharge Summary (Signed)
Physician Discharge Summary  Amber Knox KGY:[id number] DOB: [birth year] DOA: 04/05/2014  PCP: Ival Bible, MD  Admit date: 04/05/2014 Discharge date: 04/10/2014  Time spent: 35 minutes  Recommendations for Outpatient Follow-up:  1. MD at SNF: to be seen in 3 days with repeat labs- BMP/CBC. Please monitor and address DM Mx. 2. Use ultram first for pain-- try to limit oxycodone 3. Fit for compression hose 4. Dr. Ival Bible, PCP 5. Consider non emergent MRI Abdomen to evaluate for incidental hypoechoic lesion seen in pancreas on US Abdomen.  Discharge Diagnoses:  Principal Problem:   Acute encephalopathy Active Problems:   DM type 2 (diabetes mellitus, type 2)   Unspecified hypothyroidism   SOB (shortness of breath)   ARF (acute renal failure)   Pulmonary embolism   Discharge Condition: improved  Diet recommendation: cardiac/diabetic  Filed Weights   04/05/14 2121 04/09/14 0438 04/10/14 0349  Weight: 116.257 kg (256 lb 4.8 oz) 110.542 kg (243 lb 11.2 oz) 110.179 kg (242 lb 14.4 oz)    History of present illness:  Amber Knox is a 80 y.o. female history of pulmonary embolism, diabetes mellitus, hypertension, hypothyroidism and gout who has had surgery of the low back 2 months ago was brought to the ER from assisted living facility after patient was found to be confused. As per the patient's family patient was having some nausea earlier today and was given Phenergan. When patient became more confused and had some slurred speech and was talking out of her head. Patient was brought to the ER and was found to be confused. Patient's labs also shows acute worsening of her renal function. Patient also was found to be hypoxic with mild shortness of breath. CT of the head was negative for any acute. Patient's family states that patient has been found to have increasing lower extremity edema and was recently placed on metolazone. Patient is already on Lasix. Patient also was being tried  to be weaned off oxycodone and was placed on Celebrex. On my exam patient is able to follow commands and moves all extremities but at times patient was talking out off context. Abdomen is benign on exam. Patient is not in acute distress. Patient still has right lower extremity pain. Patient has bilateral lower extremity edema. Is able to move all extremities.   Hospital Course:  acute encephalopathy - suspect most likely secondary to medications in a patient with acute renal failure.  -decreased patient's Neurontin dose by half given the renal failure and DC'ed tizanidine.  -Oxycodone was made PRN.  -will try ultram 1st and limit sedating meds -patient has pain management appointment soon  - ammonia levels ok  MRI brain ruled out any stroke.  - reesolved  Acute renal failure - cause not clear. Patient's diuretics were recently increased and a new one was added.  -discontined Zaroxolyn and Celebrex  -added lasix back- -watch Cr outpatient   Hypoxia - patient has history of pulmonary embolism and was recently changed to xarelto  -VQ scan low prob  -Dopplers of the lower extremity for any PE or DVT negative.  -Patient presently is not in respiratory distress.- wean off O2 -resumed xarelto   History of PE - xarelto   Diabetes mellitus - patient was recently started on Starlix and Victoza. Patient and family want all these medications stopped and wish to continue sliding scale alone at SNF. This can be further addressed at SNF.   Hypothyroidism - continue Synthroid.   History of gout - allopurinol  Hypertension.  -resume ACE if next Cr as outpatient is stable  LE edema  -TED hose fitted as outpatient  -lasix  Anemia -  stable  Malnutrition- encourage protein intake  Letter of SNF necessity given to patient  Procedures:  EEG  MRI  Consultations:  none  Discharge Exam: Patient has chronic LBP/sciatica. Denies any other complaints. As per family, MS at  baseline.  Filed Vitals:   04/10/14 0349  BP: 162/88  Pulse: 97  Temp: 98.2 F (36.8 C)  Resp: 18    General: A+Ox3, NAD Cardiovascular: rrr Respiratory: clear  Discharge Instructions You were cared for by a hospitalist during your hospital stay. If you have any questions about your discharge medications or the care you received while you were in the hospital after you are discharged, you can call the unit and asked to speak with the hospitalist on call if the hospitalist that took care of you is not available. Once you are discharged, your primary care physician will handle any further medical issues. Please note that NO REFILLS for any discharge medications will be authorized once you are discharged, as it is imperative that you return to your primary care physician (or establish a relationship with a primary care physician if you do not have one) for your aftercare needs so that they can reassess your need for medications and monitor your lab values.      Discharge Instructions   Call MD for:    Complete by:  As directed   Altered mental state or confusion.     Diet - low sodium heart healthy    Complete by:  As directed      Diet Carb Modified    Complete by:  As directed      Discharge instructions    Complete by:  As directed   Cbc, bmp in 3 days.     Increase activity slowly    Complete by:  As directed             Medication List    STOP taking these medications       celecoxib 200 MG capsule  Commonly known as:  CELEBREX     irbesartan 300 MG tablet  Commonly known as:  AVAPRO     Liraglutide 18 MG/3ML Sopn     metolazone 5 MG tablet  Commonly known as:  ZAROXOLYN     nateglinide 120 MG tablet  Commonly known as:  STARLIX     potassium chloride SA 20 MEQ tablet  Commonly known as:  K-DUR,KLOR-CON     promethazine 12.5 MG tablet  Commonly known as:  PHENERGAN     tiZANidine 4 MG tablet  Commonly known as:  ZANAFLEX      TAKE these medications        acetaminophen 325 MG tablet  Commonly known as:  TYLENOL  Take 2 tablets (650 mg total) by mouth every 4 (four) hours as needed for mild pain (temperature >/= 99.5 F).     allopurinol 300 MG tablet  Commonly known as:  ZYLOPRIM  Take 1 tablet (300 mg total) by mouth daily.     dexlansoprazole 60 MG capsule  Commonly known as:  DEXILANT  Take 1 capsule (60 mg total) by mouth daily.     furosemide 40 MG tablet  Commonly known as:  LASIX  Take 1 tablet (40 mg total) by mouth daily at 12 noon.     gabapentin 300 MG capsule  Commonly known  as:  NEURONTIN  Take 1 capsule (300 mg total) by mouth at bedtime.     HEMOCYTE PLUS 106-1 MG Caps  Take 1 capsule by mouth daily.     insulin aspart 100 UNIT/ML injection  Commonly known as:  novoLOG  - Inject 0-9 Units into the skin 3 (three) times daily with meals. CBG < 70: implement hypoglycemia protocol       -   CBG 70 - 120: 0 units       -   CBG 121 - 150: 1 unit       -   CBG 151 - 200: 2 units       -   CBG 201 - 250: 3 units       -   CBG 251 - 300: 5 units       -   CBG 301 - 350: 7 units       -   CBG 351 - 400: 9 units       -   CBG > 400 call MD.     insulin aspart 100 UNIT/ML injection  Commonly known as:  novoLOG  - Inject 0-5 Units into the skin at bedtime. CBG < 70: implement hypoglycemia protocol       -   CBG 70 - 120: 0 units       -   CBG 121 - 150: 0 units       -   CBG 151 - 200: 0 units       -   CBG 201 - 250: 2 units       -   CBG 251 - 300: 3 units  -   CBG 301 - 350: 4 units       -   CBG 351 - 400: 5 units       -   CBG > 400 call MD.     levothyroxine 112 MCG tablet  Commonly known as:  SYNTHROID, LEVOTHROID  Take 1 tablet (112 mcg total) by mouth daily.     LIDODERM 5 %  Generic drug:  lidocaine  Place 2 patches onto the skin daily. Remove & Discard patch within 12 hours or as directed by MD     magnesium hydroxide 400 MG/5ML suspension  Commonly known as:  MILK OF MAGNESIA   Take 30 mLs by mouth daily as needed for mild constipation.     MI-ACID 200-200-20 MG/5ML suspension  Generic drug:  alum & mag hydroxide-simeth  Take 10 mLs by mouth every 6 (six) hours as needed for indigestion or heartburn.     ondansetron 8 MG disintegrating tablet  Commonly known as:  ZOFRAN-ODT  Take 1 tablet (8 mg total) by mouth every 8 (eight) hours as needed for nausea or vomiting.     oxyCODONE 5 MG immediate release tablet  Commonly known as:  Oxy IR/ROXICODONE  Take 1 tablet (5 mg total) by mouth every 8 (eight) hours as needed for severe pain.     polyethylene glycol packet  Commonly known as:  MIRALAX / GLYCOLAX  Take 17 g by mouth daily as needed for mild constipation.     rivaroxaban 20 MG Tabs tablet  Commonly known as:  XARELTO  Take 20 mg by mouth daily with supper.     traMADol 50 MG tablet  Commonly known as:  ULTRAM  Take 1 tablet (50 mg total) by mouth every 6 (six) hours as needed for moderate pain.  Allergies  Allergen Reactions  . Ace Inhibitors Swelling    Laryngeal Edema   . Beta Adrenergic Blockers Swelling    Tongue swelling    Follow-up Information   Schedule an appointment as soon as possible for a visit with Ival Bible, MD.   Specialty:  Family Medicine   Contact information:   [address] [place] [address] [phone]       Follow up with MD at SNF. Schedule an appointment as soon as possible for a visit in 1 week. (To be seen with repeat labs (CBC & BMP).)        The results of significant diagnostics from this hospitalization (including imaging, microbiology, ancillary and laboratory) are listed below for reference.    Significant Diagnostic Studies: Dg Chest 2 View  04/05/2014   CLINICAL DATA:  Altered mental status.  EXAM: CHEST  2 VIEW  COMPARISON:  CT.  08/2014.  Chest x-ray 01/01/2014.  FINDINGS: Mediastinum and hilar structures stable. Stable right pulmonary artery prominence. Lungs  are clear. Heart size normal. No pleural effusion or pneumothorax. No acute osseus abnormality.  IMPRESSION: No active cardiopulmonary disease.   Electronically Signed   By: Marcello Moores  Register   On: 04/05/2014 18:07   Dg Hip Complete Right  04/07/2014   CLINICAL DATA:  Right hip pain.  EXAM: RIGHT HIP - COMPLETE 2+ VIEW  COMPARISON:  None.  FINDINGS: There is no evidence of fracture or dislocation. Both femoral heads are seated normally within their respective acetabula. The proximal right femur appears intact. Minimal degenerative change is noted at the lower lumbar spine. The sacroiliac joints are unremarkable in appearance.  The visualized bowel gas pattern is grossly unremarkable in appearance.  IMPRESSION: No evidence of fracture or dislocation.   Electronically Signed   By: Garald Balding M.D.   On: 04/07/2014 06:54   Ct Head Wo Contrast  04/05/2014   CLINICAL DATA:  Altered mental status.  EXAM: CT HEAD WITHOUT CONTRAST  TECHNIQUE: Contiguous axial images were obtained from the base of the skull through the vertex without intravenous contrast.  COMPARISON:  PET-CT 01/10/2014.  FINDINGS: No mass. No hydrocephalus. No hemorrhage. White matter changes consistent with chronic ischemia. Dural type calcification noted along the right cerebellar hemisphere. This was slightly identified on prior PET CT. No acute bony abnormality. Mucous retention cyst both maxillary sinuses.  IMPRESSION: White matter changes consistent chronic ischemia. No acute abnormality.   Electronically Signed   By: Marcello Moores  Register   On: 04/05/2014 18:24   Mri Brain Without Contrast  04/06/2014   CLINICAL DATA:  Altered mental status  EXAM: MRI HEAD WITHOUT CONTRAST  TECHNIQUE: Multiplanar, multiecho pulse sequences of the brain and surrounding structures were obtained without intravenous contrast.  COMPARISON:  CT head 04/05/2014  FINDINGS: Ventricle size is normal. Cerebral volume normal for age. Craniocervical junction normal.  Pituitary normal in size.  Patchy hyperintensity in the cerebral white matter bilaterally and in the pons bilaterally consistent with chronic microvascular ischemia.  Negative for acute infarct.  Negative for hemorrhage or mass.  No edema or midline shift.  Paranasal sinuses reveal mild mucosal edema in the right sphenoid sinus and in the maxillary sinuses bilaterally.  IMPRESSION: Chronic microvascular ischemia.  No acute intracranial abnormality.   Electronically Signed   By: Franchot Gallo M.D.   On: 04/06/2014 08:32   US Abdomen Complete  04/06/2014   CLINICAL DATA:  Acute renal failure with nausea. History of cholecystectomy.  EXAM: ULTRASOUND ABDOMEN COMPLETE  COMPARISON:  PET CT 01/10/2014 and abdominal MR 07/22/2009  FINDINGS: Gallbladder:  Removed.  Common bile duct:  Diameter: 0.6 cm  Liver:  No focal lesion identified. Within normal limits in parenchymal echogenicity.  IVC:  No abnormality visualized.  Pancreas:  Main pancreatic duct roughly measures 0.3 cm. There is a round hypoechoic structure in the pancreatic body region that roughly measures up to 1.0 cm. This structure is indeterminate and not clearly identified on the prior imaging.  Spleen:  Spleen is small and poorly characterized.  Right Kidney:  Length: 11.6 cm. There is mild cortical thinning without hydronephrosis. Hypoechoic structure in the mid right kidney measures up to 1.2 cm and suggestive for a cyst. Anechoic structure in the lower pole measures up to 3.0 cm and compatible with a cyst.  Left Kidney:  Length: 13.0 cm. There is a round anechoic structure in the left kidney upper pole with posterior acoustic enhancement. This is compatible with a cyst that measures up to 7.5 cm. There are additional left renal cysts. There is a prominent cyst in the lower pole that measures roughly 5.4 cm. The left kidney parenchyma is diffusely echogenic and poor differentiation between the cortex and medullary region. No evidence for hydronephrosis.   Abdominal aorta:  No aneurysm visualized.  Other findings:  None.  IMPRESSION: Negative for hydronephrosis.  Bilateral renal cysts. Left kidney is echogenic and there is cortical thinning in the right kidney. Findings suggest underlying medical renal disease.  Indeterminate 1.0 cm hypoechoic structure within the pancreas. This could represent a small complex cystic structure and not confidently identified on the prior imaging. This area could be further evaluated with a non-emergent MRI.   Electronically Signed   By: Markus Daft M.D.   On: 04/06/2014 08:07   Nm Pulmonary Perf And Vent  04/05/2014   CLINICAL DATA:  Shortness of breath. Renal insufficiency. Unknown primary cancer.  EXAM: NUCLEAR MEDICINE VENTILATION - PERFUSION LUNG SCAN  TECHNIQUE: Ventilation images were obtained in multiple projections using inhaled aerosol technetium 99 M DTPA. Perfusion images were obtained in multiple projections after intravenous injection of Tc-65m MAA.  RADIOPHARMACEUTICALS:  40.0 MCi Tc-4m DTPA aerosol and 6.0 mCi Tc-40m MAA  COMPARISON:  Chest radiographs 04/05/2014, PET-CT 01/10/2014 and chest CT 12/31/2013.  FINDINGS: Ventilation: Ventilation images are limited by central clumping of the air cell. No focal ventilatory defects are identified, although some obstructive lung disease suspected.  Perfusion: No wedge shaped peripheral perfusion defects to suggest acute pulmonary embolism. Small peripheral perfusion defects likely represent chronic obstructive pulmonary disease. These appear matched with the ventilatory examination.  IMPRESSION: Low probability for acute pulmonary embolism. There are findings suggesting a degree of obstructive lung disease.   Electronically Signed   By: Camie Patience M.D.   On: 04/05/2014 23:54    Microbiology: Recent Results (from the past 240 hour(s))  MRSA PCR SCREENING     Status: None   Collection Time    04/05/14  9:41 PM      Result Value Ref Range Status   MRSA by PCR NEGATIVE   NEGATIVE Final   Comment:            The GeneXpert MRSA Assay (FDA     approved for NASAL specimens     only), is one component of a     comprehensive MRSA colonization     surveillance program. It is not     intended to diagnose MRSA  infection nor to guide or     monitor treatment for     MRSA infections.  URINE CULTURE     Status: None   Collection Time    04/06/14  9:20 AM      Result Value Ref Range Status   Specimen Description URINE, CLEAN CATCH   Final   Special Requests NONE   Final   Culture  Setup Time     Final   Value: 04/06/2014 10:05     Performed at [facility] Count     Final   Value: 4,000 COLONIES/ML     Performed at [facility]   Culture     Final   Value: INSIGNIFICANT GROWTH     Performed at [facility]   Report Status 04/07/2014 FINAL   Final     Labs: Basic Metabolic Panel:  Recent Labs Lab 04/05/14 1731 04/06/14 0633 04/07/14 0051 04/08/14 0046 04/09/14 0427  NA 131* 132* 135* 140 140  K 5.5* 5.2 4.7 4.6 5.2  CL 91* 93* 96 98 96  CO2 26 29 30 31  36*  GLUCOSE 203* 151* 183* 180* 164*  BUN 55* 52* 41* 34* 30*  CREATININE 2.32* 1.87* 1.49* 1.19* 1.11*  CALCIUM 8.4 8.6 8.5 8.9 9.6   Liver Function Tests:  Recent Labs Lab 04/05/14 1731 04/06/14 0633  AST 20 15  ALT 15 13  ALKPHOS 66 60  BILITOT 0.3 0.4  PROT 6.3 5.8*  ALBUMIN 3.3* 3.0*   No results found for this basename: LIPASE, AMYLASE,  in the last 168 hours  Recent Labs Lab 04/05/14 1731  AMMONIA 17   CBC:  Recent Labs Lab 04/05/14 1731 04/06/14 0633 04/07/14 0051 04/08/14 0305 04/09/14 0427 04/10/14 0341  WBC 11.4* 9.6 8.3 9.3 10.3 9.8  NEUTROABS 6.5 5.2  --   --   --   --   HGB 11.1* 10.1* 9.5* 9.7* 11.1* 10.5*  HCT 35.4* 32.1* 31.0* 31.1* 36.4 33.6*  MCV 92.4 92.2 93.1 93.4 95.5 93.3  PLT 318 298 290 305 329 298   Cardiac Enzymes:  Recent Labs Lab 04/05/14 1731  TROPONINI <0.30   BNP: BNP (last 3  results)  Recent Labs  01/02/14 0246 04/05/14 1731  PROBNP 4037.0* 711.3*   CBG:  Recent Labs Lab 04/09/14 0739 04/09/14 1154 04/09/14 1653 04/09/14 2031 04/10/14 0820  GLUCAP 173* 194* 199* 172* 195*    Additional labs  RBC folate >1000  Vitamin B12: 668  TSH: 0.808  UDS: Negative   Signed:  Brandon Scarbrough  [facility] 04/10/2014, 11:29 AM

## 2014-04-10 NOTE — Clinical Social Work Note (Signed)
Clinical Social Worker facilitated patient discharge including contacting patient family and facility to confirm patient discharge plans.  Clinical information faxed to facility and family agreeable with plan.  CSW arranged transport with patient family to [facility] in [place], [place].  RN to call report prior to discharge.  Clinical Social Worker will sign off for now as social work intervention is no longer needed. Please consult [place] again if new need arises.  Barbette Or, [place]

## 2014-04-10 NOTE — Progress Notes (Signed)
PROGRESS NOTE    BREHANNA DEVENY VQM:[id number] DOB: 1930-02-03 DOA: 04/05/2014 PCP: Ival Bible, MD  HPI/Brief narrative Amber Knox is a 80 y.o. female history of pulmonary embolism, diabetes mellitus, hypertension, hypothyroidism and gout who has had surgery of the low back 2 months ago was brought to the ER from assisted living facility after patient was found to be confused. As per the patient's family patient was having some nausea earlier today and was given Phenergan. When patient became more confused and had some slurred speech and was talking out of her head. Patient was brought to the ER and was found to be confused. Patient's labs also shows acute worsening of her renal function. Patient also was found to be hypoxic with mild shortness of breath. CT of the head was negative for any acute. Patient's family states that patient has been found to have increasing lower extremity edema and was recently placed on metolazone. Patient is already on Lasix. Patient also was being tried to be weaned off oxycodone and was placed on Celebrex. On exam patient was able to follow commands and moves all extremities but at times patient was talking out off context. Abdomen is benign on exam. Patient is not in acute distress. Patient still has right lower extremity pain. Patient has bilateral lower extremity edema. Is able to move all extremities.      Assessment/Plan:  acute encephalopathy - suspect most likely secondary to medications in a patient with acute renal failure.  -decreased patient's Neurontin dose by half given the renal failure and DC'ed tizanidine.  -Oxycodone was made PRN.  -will try ultram 1st and limit sedating meds  -patient has pain management appointment soon  - ammonia levels ok  - MRI brain ruled out any stroke.  - resolved   Acute renal failure - cause not clear- possible due to meds. Patient's diuretics were recently increased and a new one was added.  -discontined  Zaroxolyn and Celebrex  -added lasix back-  -watch Cr outpatient   Hypoxia - patient has history of pulmonary embolism and was recently changed to xarelto  -VQ scan low prob  -Dopplers of the lower extremity for any PE or DVT negative.  -Patient presently is not in respiratory distress.- weaned off O2  -resumed xarelto   History of PE - xarelto   Diabetes mellitus - patient was recently started on Starlix and Victoza. Patient and family want all these medications stopped and wish to continue sliding scale alone at SNF. This can be further addressed at SNF.   Hypothyroidism - continue Synthroid.   History of gout - allopurinol   Hypertension.  -resume ACE if next Cr as outpatient is stable   LE edema  -TED hose fitted as outpatient  -lasix   Anemia -  stable   Malnutrition- encourage protein intake    Code Status: Full Family Communication: Discussed with granddaughter Ms. Melody, another granddaughter and a daughter-in-law at bedside. Discussed extensively regarding care and discharge plan. Disposition Plan: Discharge to SNF today.   Consultants:  None  Procedures:  None  Antibiotics:  None chronic low back pain   Subjective: Chronic LBP. MS at baseline.  Objective: Filed Vitals:   04/09/14 1652 04/09/14 2028 04/10/14 0009 04/10/14 0349  BP: 150/61 134/54 153/55 162/88  Pulse: 83 86 85 97  Temp:  99.2 F (37.3 C) 98.5 F (36.9 C) 98.2 F (36.8 C)  TempSrc:  Oral Oral Oral  Resp: 17 18 18 18   Height:  Weight:    110.179 kg (242 lb 14.4 oz)  SpO2: 95% 97% 91% 92%    Intake/Output Summary (Last 24 hours) at 04/10/14 1130 Last data filed at 04/10/14 0353  Gross per 24 hour  Intake    360 ml  Output   2500 ml  Net  -2140 ml   Filed Weights   04/05/14 2121 04/09/14 0438 04/10/14 0349  Weight: 116.257 kg (256 lb 4.8 oz) 110.542 kg (243 lb 11.2 oz) 110.179 kg (242 lb 14.4 oz)     Exam:  General exam: Pleasant elderly female sitting  comfortably on a reclining chair. Respiratory system: Clear. No increased work of breathing. Cardiovascular system: S1 & S2 heard, RRR. No JVD, murmurs, gallops, clicks. Trace bilateral ankle edema.  Gastrointestinal system: Abdomen is nondistended, soft and nontender. Normal bowel sounds heard. Central nervous system: Alert and oriented. No focal neurological deficits. Extremities: Symmetric 5 x 5 power.   Data Reviewed: Basic Metabolic Panel:  Recent Labs Lab 04/05/14 1731 04/06/14 0633 04/07/14 0051 04/08/14 0046 04/09/14 0427  NA 131* 132* 135* 140 140  K 5.5* 5.2 4.7 4.6 5.2  CL 91* 93* 96 98 96  CO2 26 29 30 31  36*  GLUCOSE 203* 151* 183* 180* 164*  BUN 55* 52* 41* 34* 30*  CREATININE 2.32* 1.87* 1.49* 1.19* 1.11*  CALCIUM 8.4 8.6 8.5 8.9 9.6   Liver Function Tests:  Recent Labs Lab 04/05/14 1731 04/06/14 0633  AST 20 15  ALT 15 13  ALKPHOS 66 60  BILITOT 0.3 0.4  PROT 6.3 5.8*  ALBUMIN 3.3* 3.0*   No results found for this basename: LIPASE, AMYLASE,  in the last 168 hours  Recent Labs Lab 04/05/14 1731  AMMONIA 17   CBC:  Recent Labs Lab 04/05/14 1731 04/06/14 0633 04/07/14 0051 04/08/14 0305 04/09/14 0427 04/10/14 0341  WBC 11.4* 9.6 8.3 9.3 10.3 9.8  NEUTROABS 6.5 5.2  --   --   --   --   HGB 11.1* 10.1* 9.5* 9.7* 11.1* 10.5*  HCT 35.4* 32.1* 31.0* 31.1* 36.4 33.6*  MCV 92.4 92.2 93.1 93.4 95.5 93.3  PLT 318 298 290 305 329 298   Cardiac Enzymes:  Recent Labs Lab 04/05/14 1731  TROPONINI <0.30   BNP (last 3 results)  Recent Labs  01/02/14 0246 04/05/14 1731  PROBNP 4037.0* 711.3*   CBG:  Recent Labs Lab 04/09/14 0739 04/09/14 1154 04/09/14 1653 04/09/14 2031 04/10/14 0820  GLUCAP 173* 194* 199* 172* 195*    Recent Results (from the past 240 hour(s))  MRSA PCR SCREENING     Status: None   Collection Time    04/05/14  9:41 PM      Result Value Ref Range Status   MRSA by PCR NEGATIVE  NEGATIVE Final   Comment:             The GeneXpert MRSA Assay (FDA     approved for NASAL specimens     only), is one component of a     comprehensive MRSA colonization     surveillance program. It is not     intended to diagnose MRSA     infection nor to guide or     monitor treatment for     MRSA infections.  URINE CULTURE     Status: None   Collection Time    04/06/14  9:20 AM      Result Value Ref Range Status   Specimen Description URINE, CLEAN CATCH  Final   Special Requests NONE   Final   Culture  Setup Time     Final   Value: 04/06/2014 10:05     Performed at [facility]     Final   Value: 4,000 COLONIES/ML     Performed at [facility]   Culture     Final   Value: INSIGNIFICANT GROWTH     Performed at [facility]   Report Status 04/07/2014 FINAL   Final           Studies: No results found.      Scheduled Meds: . allopurinol  300 mg Oral Daily  . furosemide  40 mg Oral Q1200  . gabapentin  300 mg Oral QHS  . insulin aspart  0-5 Units Subcutaneous QHS  . insulin aspart  0-9 Units Subcutaneous TID WC  . levothyroxine  112 mcg Oral QAC breakfast  . lidocaine  2 patch Transdermal Q24H  . nateglinide  120 mg Oral TID WC  . ondansetron  8 mg Oral TID AC  . pantoprazole  40 mg Oral Daily  . rivaroxaban  20 mg Oral Q lunch  . sodium chloride  3 mL Intravenous Q12H   Continuous Infusions:   Principal Problem:   Acute encephalopathy Active Problems:   DM type 2 (diabetes mellitus, type 2)   Unspecified hypothyroidism   SOB (shortness of breath)   ARF (acute renal failure)   Pulmonary embolism    Time spent: 25 minutes    HONGALGI,ANAND, MD, FACP, FHM. [facility] Pager [phone]  If 7PM-7AM, please contact night-coverage [email or web address] Password [email or web address] 04/10/2014, 11:30 AM    LOS: 5 days

## 2014-04-10 NOTE — Clinical Social Work Placement (Signed)
Clinical Social Work Department CLINICAL SOCIAL WORK PLACEMENT NOTE 04/10/2014  Patient:  Amber Knox, Amber Knox  Account Number:  [id number] Admit date:  04/05/2014  Clinical Social Worker:  Barbette Or, LCSW  Date/time:  04/10/2014 12:00 N  Clinical Social Work is seeking post-discharge placement for this patient at the following level of care:   SKILLED NURSING   (*CSW will update this form in Epic as items are completed)   04/08/2014  Patient/family provided with [facility] Department of Clinical Social Work's list of facilities offering this level of care within the geographic area requested by the patient (or if unable, by the patient's family).  04/08/2014  Patient/family informed of their freedom to choose among providers that offer the needed level of care, that participate in Medicare, Medicaid or managed care program needed by the patient, have an available bed and are willing to accept the patient.  04/08/2014  Patient/family informed of [facility]' ownership interest in [facility], as well as of the fact that they are under no obligation to receive care at this facility.  PASARR submitted to EDS on  PASARR number received on   FL2 transmitted to all facilities in geographic area requested by pt/family on  04/08/2014 FL2 transmitted to all facilities within larger geographic area on   Patient informed that his/her managed care company has contracts with or will negotiate with  certain facilities, including the following:     Patient/family informed of bed offers received:  04/08/2014 Patient chooses bed at [facility] Physician recommends and patient chooses bed at    Patient to be transferred to [facility] on  04/10/2014 Patient to be transferred to facility by [facility] Patient and family notified of transfer on 04/10/2014 Name of family member notified:  Deang,Melody (GRANDDAUGHTER AT [facility])  The following physician request  were entered in Epic:   Additional Comments:

## 2014-04-10 NOTE — Progress Notes (Addendum)
Pt discharged with family to [facility] in [place], [place].  Reviewed discharge instructions and education. All questions answered.  Assessment unchanged from this morning. Called report to nurse at [facility]

## 2014-04-13 ENCOUNTER — Ambulatory Visit: Payer: Medicare Other | Admitting: Family Medicine

## 2014-05-16 ENCOUNTER — Other Ambulatory Visit: Payer: Self-pay | Admitting: Family Medicine

## 2014-05-16 DIAGNOSIS — M1A9XX0 Chronic gout, unspecified, without tophus (tophi): Secondary | ICD-10-CM

## 2014-05-16 DIAGNOSIS — D649 Anemia, unspecified: Secondary | ICD-10-CM

## 2014-05-16 DIAGNOSIS — K219 Gastro-esophageal reflux disease without esophagitis: Secondary | ICD-10-CM

## 2014-05-16 DIAGNOSIS — G894 Chronic pain syndrome: Secondary | ICD-10-CM

## 2014-05-16 DIAGNOSIS — IMO0001 Reserved for inherently not codable concepts without codable children: Secondary | ICD-10-CM

## 2014-05-16 DIAGNOSIS — E785 Hyperlipidemia, unspecified: Secondary | ICD-10-CM

## 2014-05-16 DIAGNOSIS — I509 Heart failure, unspecified: Secondary | ICD-10-CM

## 2014-05-16 DIAGNOSIS — E039 Hypothyroidism, unspecified: Secondary | ICD-10-CM

## 2014-05-16 DIAGNOSIS — E1165 Type 2 diabetes mellitus with hyperglycemia: Secondary | ICD-10-CM

## 2014-05-17 ENCOUNTER — Other Ambulatory Visit: Payer: Self-pay | Admitting: *Deleted

## 2014-05-17 DIAGNOSIS — K219 Gastro-esophageal reflux disease without esophagitis: Secondary | ICD-10-CM

## 2014-05-17 DIAGNOSIS — D649 Anemia, unspecified: Secondary | ICD-10-CM

## 2014-05-17 DIAGNOSIS — G894 Chronic pain syndrome: Secondary | ICD-10-CM

## 2014-05-17 DIAGNOSIS — IMO0001 Reserved for inherently not codable concepts without codable children: Secondary | ICD-10-CM

## 2014-05-17 DIAGNOSIS — E785 Hyperlipidemia, unspecified: Secondary | ICD-10-CM

## 2014-05-17 DIAGNOSIS — E039 Hypothyroidism, unspecified: Secondary | ICD-10-CM

## 2014-05-17 DIAGNOSIS — I509 Heart failure, unspecified: Secondary | ICD-10-CM

## 2014-05-17 DIAGNOSIS — M1A9XX0 Chronic gout, unspecified, without tophus (tophi): Secondary | ICD-10-CM

## 2014-05-17 DIAGNOSIS — E1165 Type 2 diabetes mellitus with hyperglycemia: Secondary | ICD-10-CM

## 2014-05-18 ENCOUNTER — Other Ambulatory Visit: Payer: Self-pay | Admitting: Family Medicine

## 2014-05-18 DIAGNOSIS — I10 Essential (primary) hypertension: Secondary | ICD-10-CM

## 2014-05-18 MED ORDER — IRBESARTAN 300 MG PO TABS: 300.0000 mg | ORAL_TABLET | Freq: Every day | ORAL | Status: AC

## 2015-06-14 IMAGING — CR DG CHEST 2V
2 series · 2 of 2 positions shown · non-contrast
Comparison: None.

CLINICAL DATA: Pre operative respiratory exam. Lumbar spinal
stenosis.

EXAM:
CHEST  2 VIEW

[w chest pa]
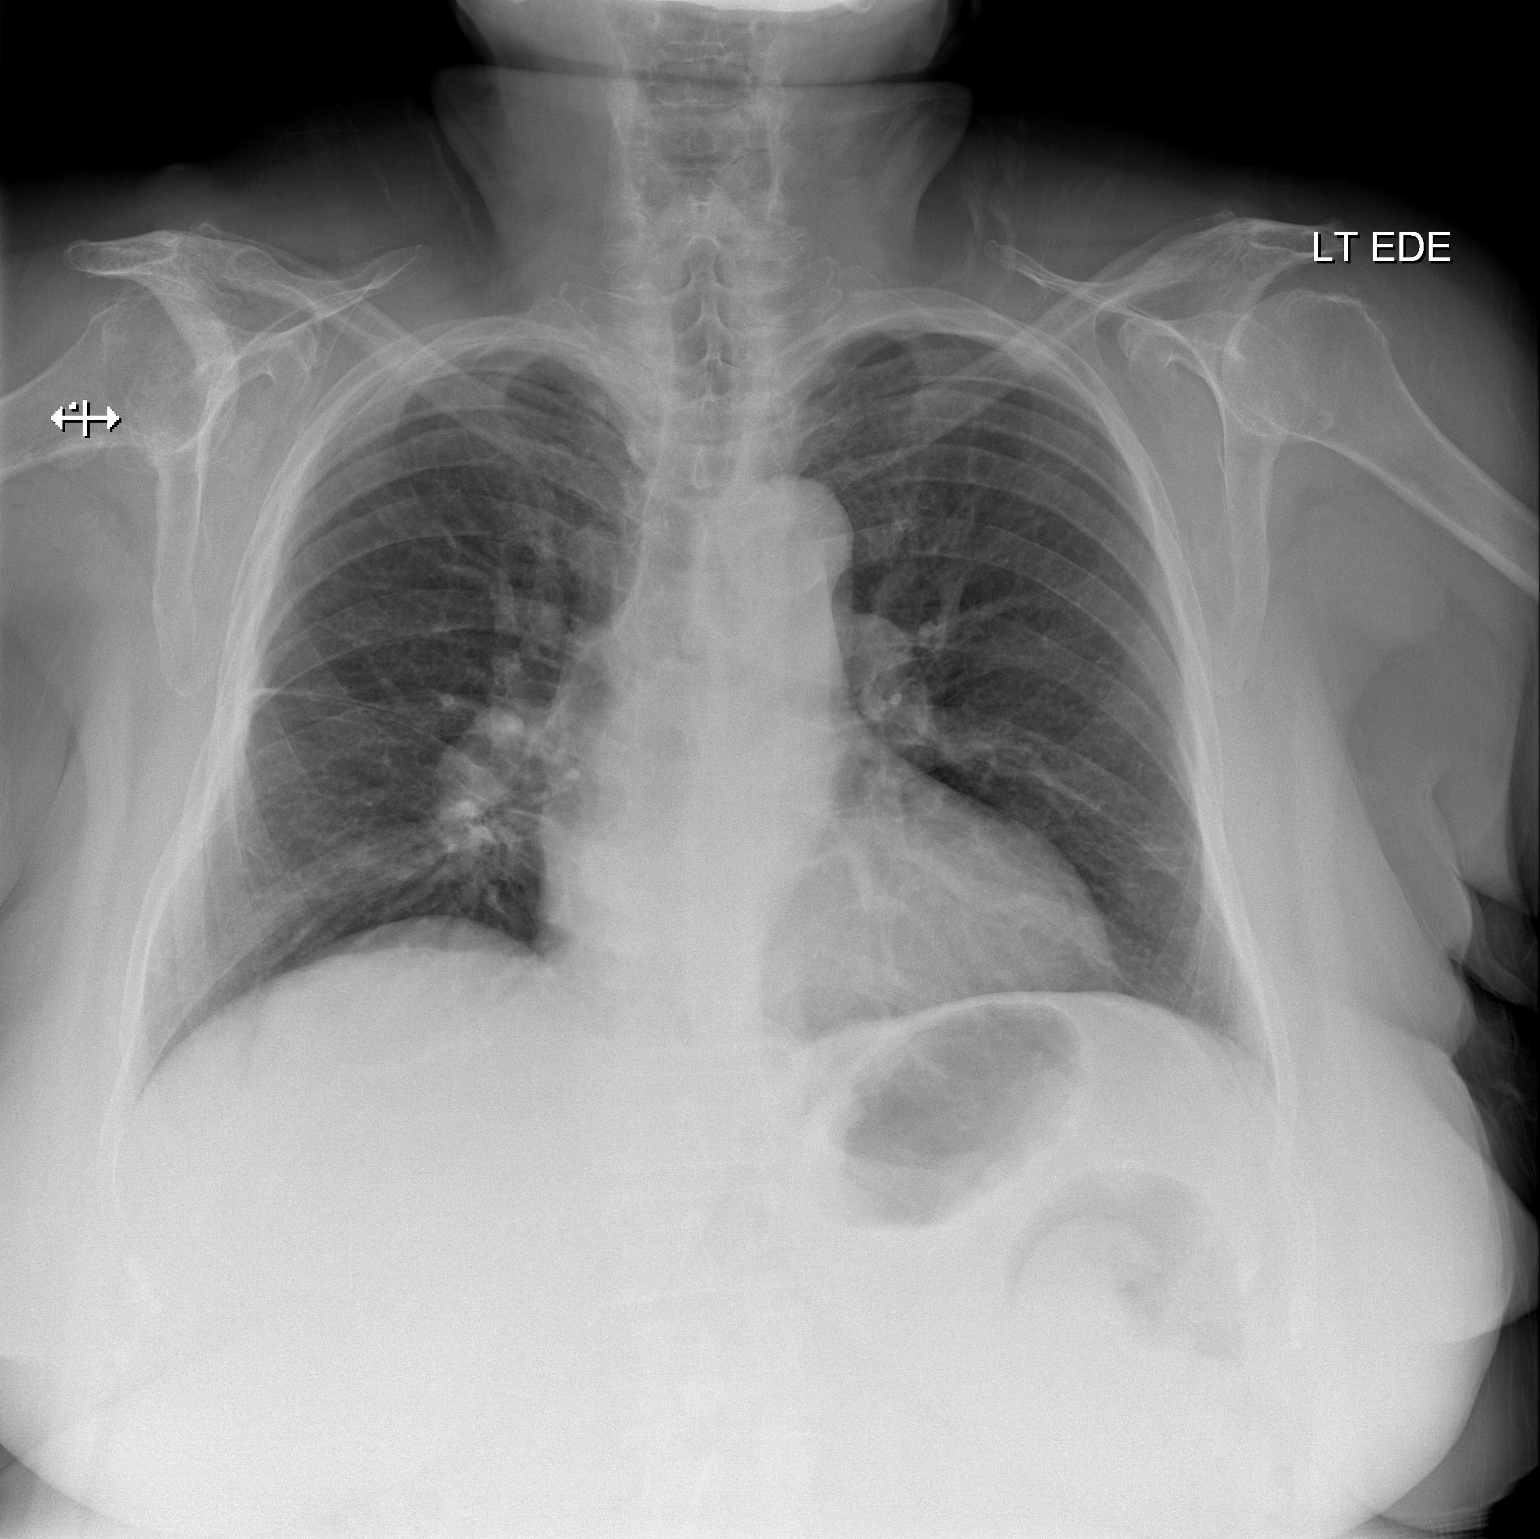

[w chest lat]
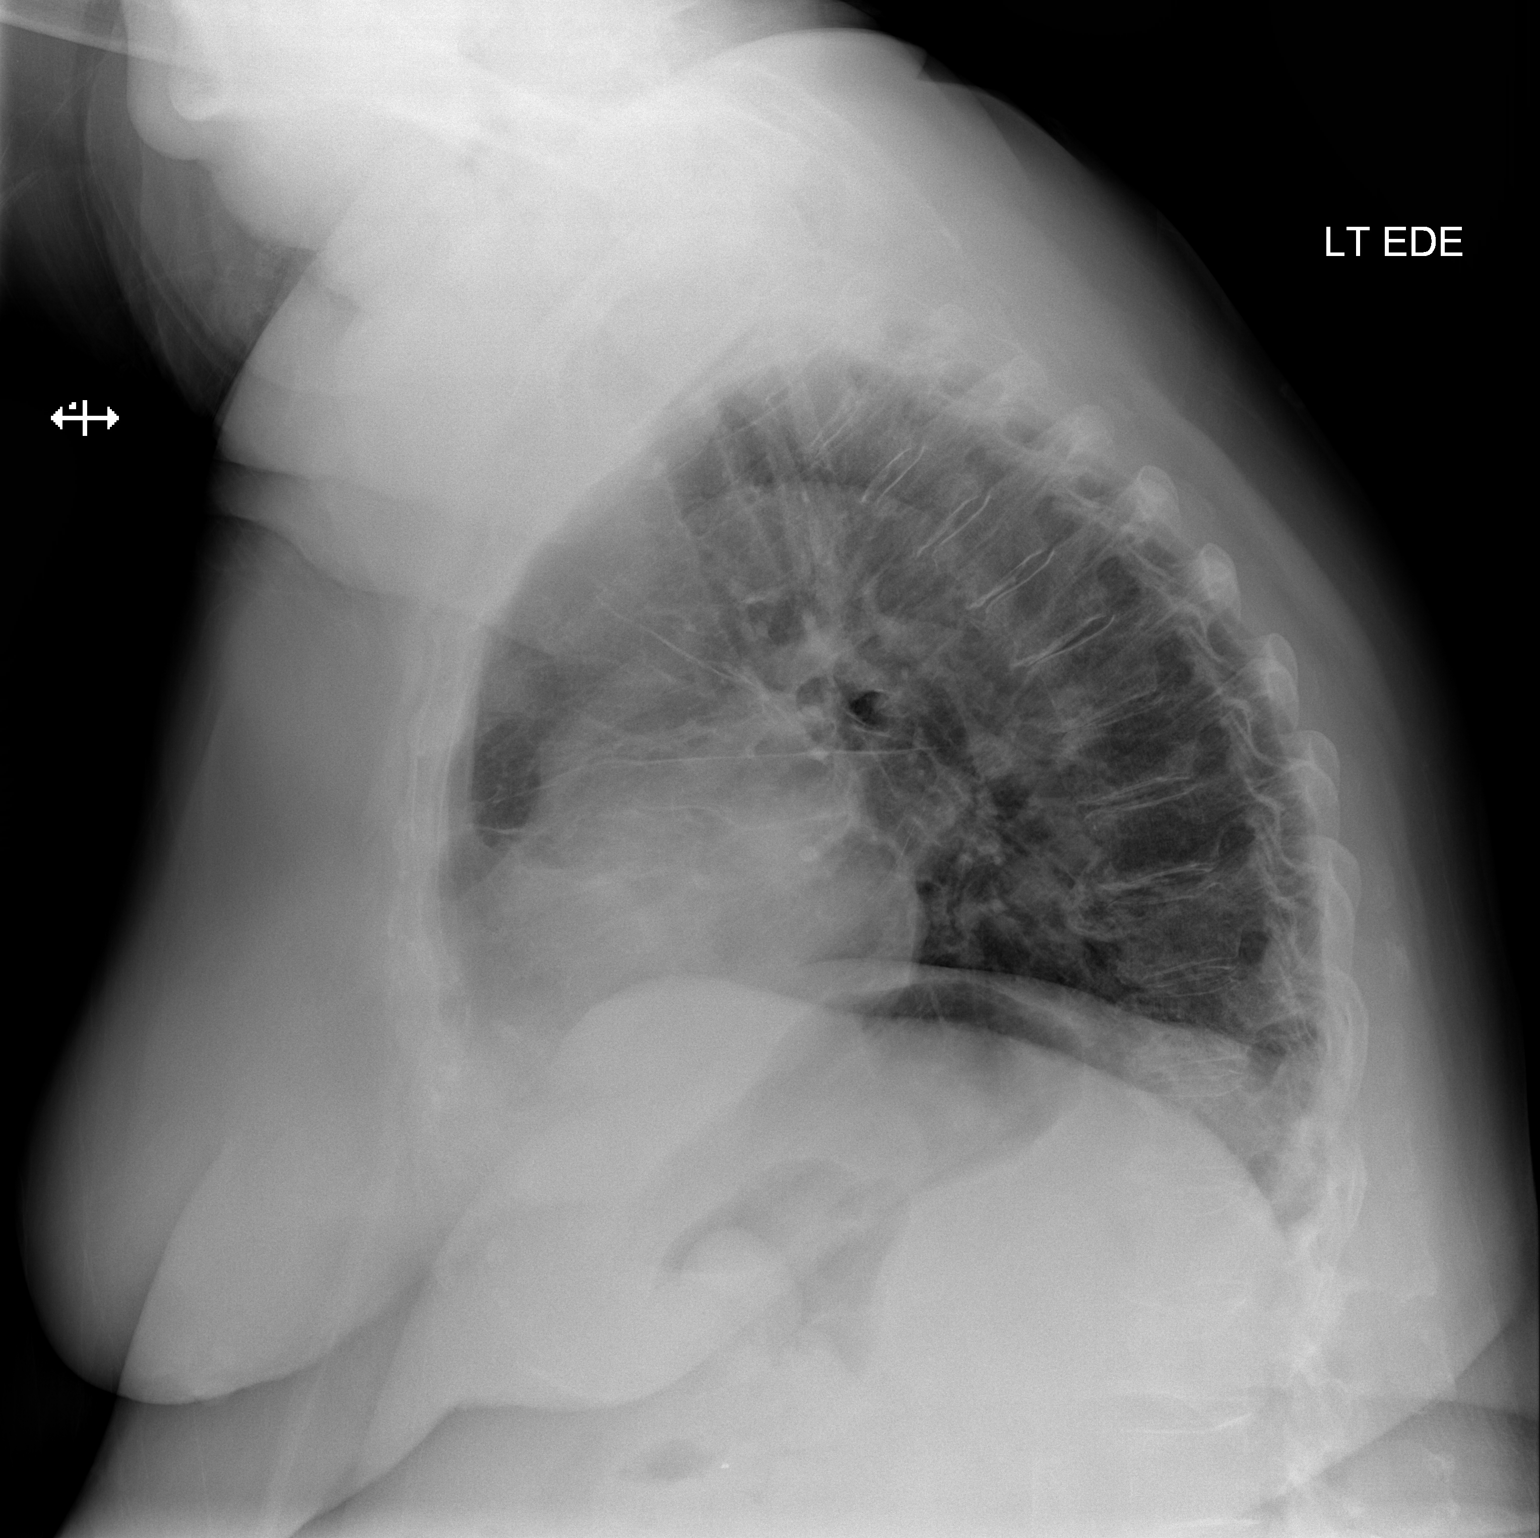

[2 of 2 positions shown; findings below may reference images not displayed]

FINDINGS: The heart size and pulmonary vascularity are normal. No infiltrates
or effusions. Tiny linear areas of scarring at the lung bases.
Slight thickening of the minor fissure on the right which is most
likely chronic.

No acute osseous abnormality. Severe arthritis is present at the
right shoulder with loose bodies in the joint.
IMPRESSION: No acute abnormality.

## 2015-06-17 IMAGING — CR DG CHEST 1V PORT
1 series · 1 of 1 positions shown · non-contrast
Comparison: Chest x-ray 12/28/2013.

CLINICAL DATA: Hypoxia.  Fatigue.

EXAM:
PORTABLE CHEST - 1 VIEW

[AP]
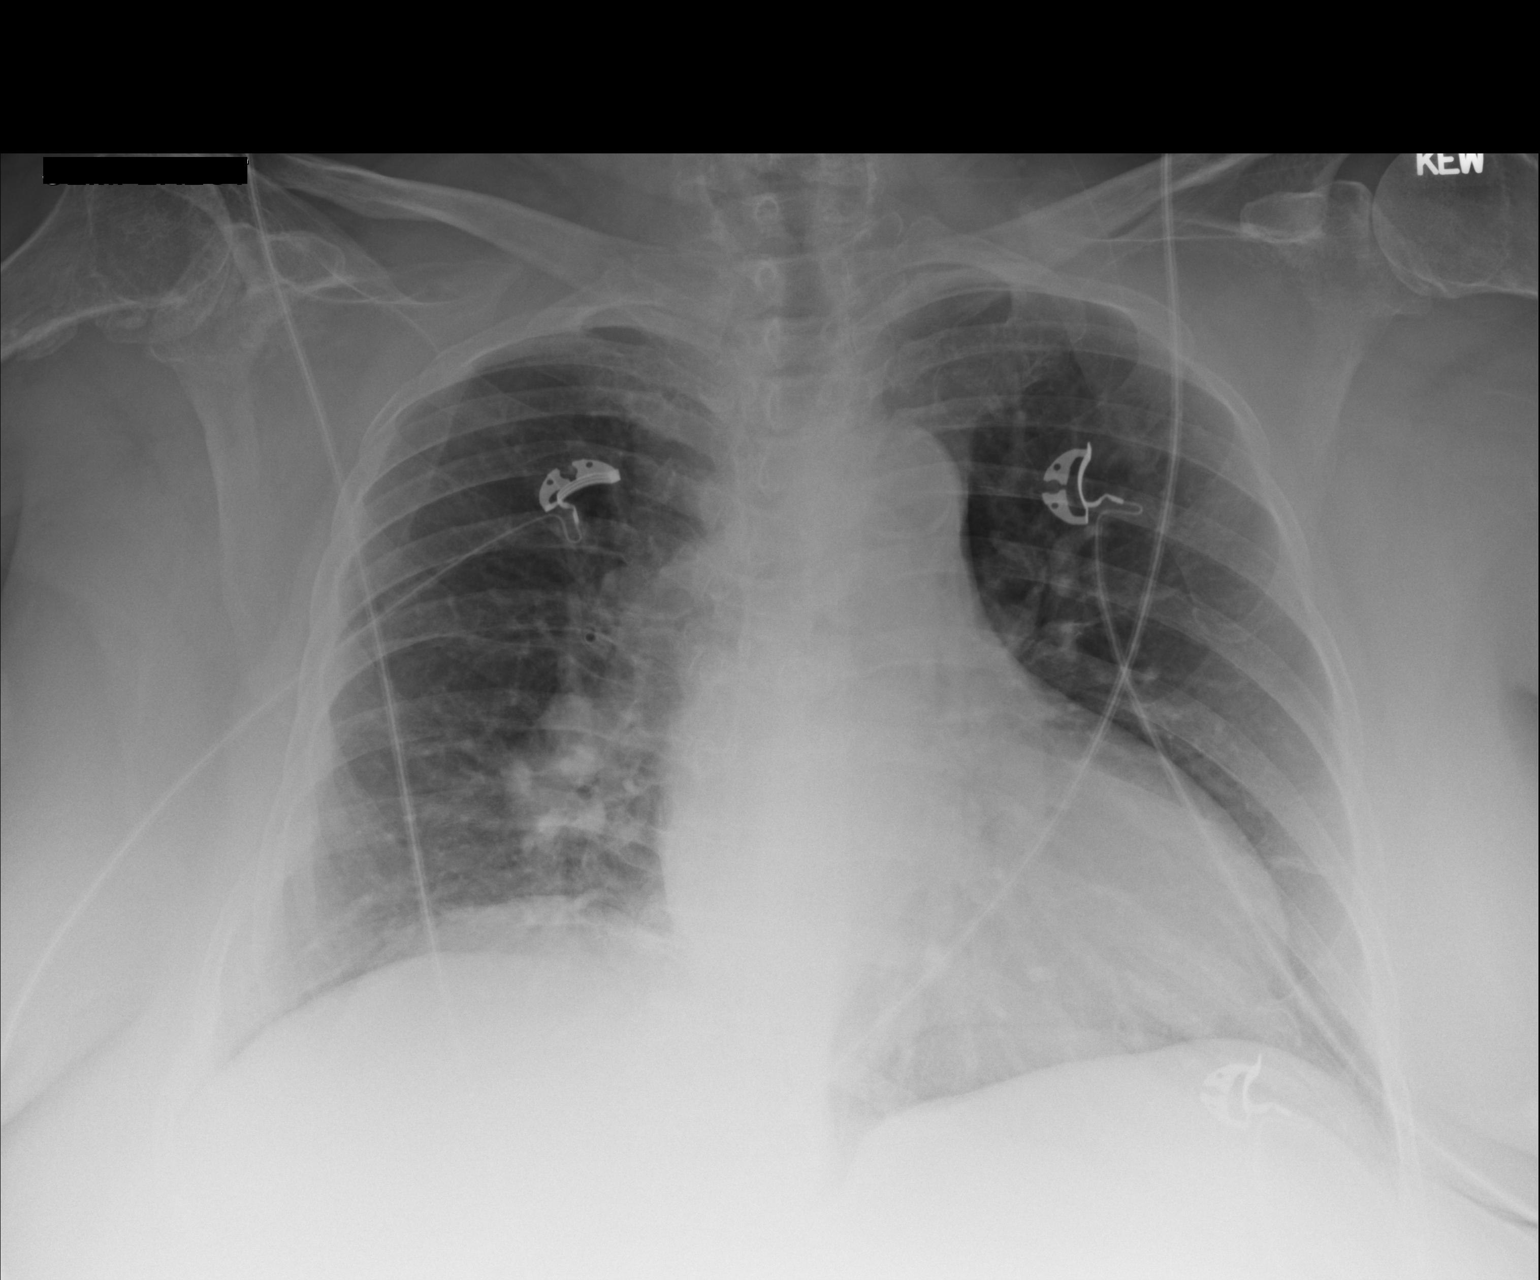

[1 of 1 positions shown; findings below may reference images not displayed]

FINDINGS: There is a masslike appearance of the right infrahilar region. Lungs
otherwise appear clear. No pleural effusions. No evidence of
pulmonary edema. Mild cardiomegaly discretion borderline
cardiomegaly, likely accentuated by portable AP technique.
Atherosclerosis in the thoracic aorta.
IMPRESSION: 1. Masslike appearance of the right infrahilar region. The
possibility of underlying pulmonary mass and/or right sided
lymphadenopathy is not excluded, and correlation with contrast
enhanced chest CT is suggested at this time. Given the patient's
history of hypoxia, PE protocol CT scan would be optimal to evaluate
the anatomy associated with this finding, as well as to exclude
underlying pulmonary embolism if clinically appropriate.
2. Atherosclerosis.
These results were called by telephone at the time of interpretation
on 12/31/2013 at [DATE] to Dr. JUNIOR KIMBO GUILLOTY PEREZ, who verbally
acknowledged these results.

## 2017-01-14 ENCOUNTER — Emergency Department (HOSPITAL_COMMUNITY): Payer: Medicare Other

## 2017-01-14 ENCOUNTER — Emergency Department (HOSPITAL_COMMUNITY)
Admission: EM | Admit: 2017-01-14 | Discharge: 2017-01-15 | Disposition: A | Discharge status: Home / Self Care | Payer: Medicare Other | Attending: Emergency Medicine | Admitting: Emergency Medicine

## 2017-01-14 ENCOUNTER — Encounter (HOSPITAL_COMMUNITY): Payer: Self-pay

## 2017-01-14 DIAGNOSIS — E039 Hypothyroidism, unspecified: Secondary | ICD-10-CM | POA: Insufficient documentation

## 2017-01-14 DIAGNOSIS — W010XXA Fall on same level from slipping, tripping and stumbling without subsequent striking against object, initial encounter: Secondary | ICD-10-CM | POA: Diagnosis not present

## 2017-01-14 DIAGNOSIS — S2231XA Fracture of one rib, right side, initial encounter for closed fracture: Secondary | ICD-10-CM | POA: Insufficient documentation

## 2017-01-14 DIAGNOSIS — I10 Essential (primary) hypertension: Secondary | ICD-10-CM | POA: Diagnosis not present

## 2017-01-14 DIAGNOSIS — S299XXA Unspecified injury of thorax, initial encounter: Secondary | ICD-10-CM | POA: Diagnosis present

## 2017-01-14 DIAGNOSIS — E119 Type 2 diabetes mellitus without complications: Secondary | ICD-10-CM | POA: Diagnosis not present

## 2017-01-14 DIAGNOSIS — Y999 Unspecified external cause status: Secondary | ICD-10-CM | POA: Insufficient documentation

## 2017-01-14 DIAGNOSIS — Z7901 Long term (current) use of anticoagulants: Secondary | ICD-10-CM | POA: Insufficient documentation

## 2017-01-14 DIAGNOSIS — Z794 Long term (current) use of insulin: Secondary | ICD-10-CM | POA: Diagnosis not present

## 2017-01-14 DIAGNOSIS — Y929 Unspecified place or not applicable: Secondary | ICD-10-CM | POA: Insufficient documentation

## 2017-01-14 DIAGNOSIS — Z96653 Presence of artificial knee joint, bilateral: Secondary | ICD-10-CM | POA: Diagnosis not present

## 2017-01-14 DIAGNOSIS — Y9301 Activity, walking, marching and hiking: Secondary | ICD-10-CM | POA: Diagnosis not present

## 2017-01-14 DIAGNOSIS — W19XXXA Unspecified fall, initial encounter: Secondary | ICD-10-CM

## 2017-01-14 MED ORDER — OXYCODONE-ACETAMINOPHEN 5-325 MG PO TABS: 1.0000 | ORAL_TABLET | ORAL | 0 refills | Status: DC | PRN

## 2017-01-14 MED ORDER — KETOROLAC TROMETHAMINE 60 MG/2ML IM SOLN
60.0000 mg | Freq: Once | INTRAMUSCULAR | Status: AC
  Administered 2017-01-14: 60 mg via INTRAMUSCULAR
  Filled 2017-01-14: qty 2

## 2017-01-14 MED ORDER — MORPHINE SULFATE (PF) 4 MG/ML IV SOLN
4.0000 mg | Freq: Once | INTRAVENOUS | Status: AC
  Administered 2017-01-14: 4 mg via INTRAVENOUS
  Filled 2017-01-14: qty 1

## 2017-01-14 MED ORDER — OXYCODONE-ACETAMINOPHEN 5-325 MG PO TABS
2.0000 | ORAL_TABLET | Freq: Once | ORAL | Status: AC
  Administered 2017-01-14: 2 via ORAL
  Filled 2017-01-14: qty 2

## 2017-01-14 NOTE — ED Triage Notes (Addendum)
Pt reports she tripped and fell when trying to open a set of double doors today and landed on her right side. No LOC, no head injury. Pain worse with movement. She reports pain to right rib cage/back. No blood thinners. A&ox4.

## 2017-01-14 NOTE — Care Management (Signed)
ED CM consulted concerning [place] recommendation for [facility] services. CM met with patient and daughter at bedside to discuss recommendations, patient and daughter Luis Abed [phone], they are both agreeable to the recommendations. CM offered choice, [facility] selected. CM verified insurance and contact information. CM explained that 24-48 hours post discharge a nurse from [facility] will contact patient by phone to arrange the initial assessment visit, she was also instructed to contact [facility] if she does not receive a call within this time frame. Patient and daughter verbalized understanding teach back done. No additional question or concernsverbalized at this time patient was provided CM contact information if she should have any further questions or concerns. CM updated Dr. Venora Maples on care transition  he is in agreement with plan as well.

## 2017-01-14 NOTE — ED Provider Notes (Signed)
[facility] DEPT Provider Note   CSN: [id number] Arrival date & time: 01/14/17  1404     History   Chief Complaint Chief Complaint  Patient presents with  . Fall    HPI Amber Knox is a 83 y.o. female.  HPI Patient tripped and fell when she was walking through doors today and fell onto her right side.  She presents with severe pain in her right lateral chest.  She denies head injury.  Denies neck pain.  No weakness of her arms or legs.  Denies pain in hips.  She is not on anticoagulants.  Pain is worse with palpation and movement of her right lateral chest.  Pain is severe in severity at this time   Past Medical History:  Diagnosis Date  . Colon polyps    Tubular Adenoma  . Diverticulosis   . DM type 2 (diabetes mellitus, type 2) ([place])   . DVT (deep venous thrombosis) ([place]) 12-31-13  . Gallstone   . GERD (gastroesophageal reflux disease)   . Gout   . Heart murmur   . Hiatal hernia   . Hx of osteoarthritis    bilateral knee osteoarthritis  -   bilateral knee replacement  . Hyperlipemia   . Hypertension   . Hypothyroidism   . Osteopenia    Lumbar spine  . Peripheral neuropathy   . Pulmonary embolus [facility]) 12-31-13    Patient Active Problem List   Diagnosis Date Noted  . Altered mental status 04/05/2014  . Acute encephalopathy 04/05/2014  . SOB (shortness of breath) 04/05/2014  . ARF (acute renal failure) ([place]) 04/05/2014  . Pulmonary embolism ([place]) 04/05/2014  . HNP (herniated nucleus pulposus), lumbar 02/16/2014  . Lumbar disc herniation with radiculopathy 02/16/2014  . Metastatic cancer ([place]) 01/05/2014  . Palliative care encounter 01/02/2014  . Pain of back and right lower extremity 01/02/2014  . Metastasis to adrenal gland of unknown origin ([place]) 01/01/2014  . Acute massive pulmonary embolism ([place]) 12/31/2013  . Shock circulatory ([place]) 12/31/2013  . Acute respiratory failure ([place]) 12/31/2013  . Lumbar disc disease 12/31/2013  . Fracture of femoral  condyle, left, closed ([place]) 11/17/2013  . Nausea alone 11/17/2013  . Abnormality of gait 11/17/2013  . Back pain 11/17/2013  . Pain in joint, lower leg 10/30/2013  . Unspecified hypothyroidism 09/30/2013  . Type II or unspecified type diabetes mellitus without mention of complication, not stated as uncontrolled 09/30/2013  . Anemia 09/30/2013  . DM type 2 (diabetes mellitus, type 2) ([place])   . Hyperlipemia   . Hypertension   . Peripheral neuropathy   . Osteopenia   . Hx of osteoarthritis   . Hiatal hernia   . GERD (gastroesophageal reflux disease)   . Kidney cysts   . Colon polyps   . Diverticulosis     Past Surgical History:  Procedure Laterality Date  . ABDOMINAL HYSTERECTOMY     Excessive Bleeding (IUD)   . CARDIAC CATHETERIZATION  1995   [facility] (Normal)   . CARPAL TUNNEL RELEASE Right   . CATARACT EXTRACTION Right   . CHOLECYSTECTOMY     Gallstones   . COLONOSCOPY    . ESOPHAGOGASTRODUODENOSCOPY (EGD) WITH ESOPHAGEAL DILATION    . JOINT REPLACEMENT Bilateral    Knees   . KNEE ARTHROPLASTY Bilateral   . LUMBAR LAMINECTOMY/DECOMPRESSION MICRODISCECTOMY N/A 02/16/2014   Procedure: Left Lumbar two/three Microdiskectomy, Right Lumbar four/five decompression lumbar laminectomy  ;  Surgeon: Charlie Pitter, MD;  Location: [facility];  Service: Neurosurgery;  Laterality: N/A;  . TONSILLECTOMY      OB History    No data available       Home Medications    Prior to Admission medications   Medication Sig Start Date End Date Taking? Authorizing Provider  allopurinol (ZYLOPRIM) 300 MG tablet Take 1 tablet (300 mg total) by mouth daily. 07/19/13 07/19/14  Jonathon Resides, MD  alum & mag hydroxide-simeth (MI-ACID) 200-200-20 MG/5ML suspension Take 10 mLs by mouth every 6 (six) hours as needed for indigestion or heartburn.    Historical Provider, MD  dexlansoprazole (DEXILANT) 60 MG capsule Take 1 capsule (60 mg total) by mouth daily. 02/26/14 02/27/15  Jonathon Resides, MD  Fe Fum-FA-B  Cmp-C-Zn-Mg-Mn-Cu (HEMOCYTE PLUS) 106-1 MG CAPS Take 1 capsule by mouth daily.    Historical Provider, MD  gabapentin (NEURONTIN) 300 MG capsule Take 1 capsule (300 mg total) by mouth at bedtime. 04/09/14   Geradine Girt, DO  insulin aspart (NOVOLOG) 100 UNIT/ML injection Inject 0-9 Units into the skin 3 (three) times daily with meals. CBG < 70: implement hypoglycemia protocol        CBG 70 - 120: 0 units        CBG 121 - 150: 1 unit        CBG 151 - 200: 2 units        CBG 201 - 250: 3 units        CBG 251 - 300: 5 units        CBG 301 - 350: 7 units        CBG 351 - 400: 9 units        CBG > 400 call MD. 04/10/14   Modena Jansky, MD  insulin aspart (NOVOLOG) 100 UNIT/ML injection Inject 0-5 Units into the skin at bedtime. CBG < 70: implement hypoglycemia protocol        CBG 70 - 120: 0 units        CBG 121 - 150: 0 units        CBG 151 - 200: 0 units        CBG 201 - 250: 2 units        CBG 251 - 300: 3 units   CBG 301 - 350: 4 units        CBG 351 - 400: 5 units        CBG > 400 call MD. 04/10/14   Modena Jansky, MD  levothyroxine (SYNTHROID, LEVOTHROID) 112 MCG tablet Take 1 tablet (112 mcg total) by mouth daily. 11/02/13 11/02/14  Jonathon Resides, MD  lidocaine (LIDODERM) 5 % Place 2 patches onto the skin daily. Remove & Discard patch within 12 hours or as directed by MD    Historical Provider, MD  magnesium hydroxide (MILK OF MAGNESIA) 400 MG/5ML suspension Take 30 mLs by mouth daily as needed for mild constipation.    Historical Provider, MD  ondansetron (ZOFRAN-ODT) 8 MG disintegrating tablet Take 1 tablet (8 mg total) by mouth every 8 (eight) hours as needed for nausea or vomiting. 04/10/14   Modena Jansky, MD  oxyCODONE (OXY IR/ROXICODONE) 5 MG immediate release tablet Take 1 tablet (5 mg total) by mouth every 8 (eight) hours as needed for severe pain. 04/09/14   Geradine Girt, DO  oxyCODONE-acetaminophen (PERCOCET/ROXICET) 5-325 MG tablet Take 1 tablet by mouth every 4 (four)  hours as needed for severe pain. 01/14/17   Jola Schmidt, MD  polyethylene  glycol (MIRALAX / GLYCOLAX) packet Take 17 g by mouth daily as needed for mild constipation. 01/05/14   Janece Canterbury, MD  rivaroxaban (XARELTO) 20 MG TABS tablet Take 20 mg by mouth daily with supper.    Historical Provider, MD  traMADol (ULTRAM) 50 MG tablet Take 1 tablet (50 mg total) by mouth every 6 (six) hours as needed for moderate pain. 04/09/14   Geradine Girt, DO    Family History Family History  Problem Relation Age of Onset  . Vascular Disease Father     Cardio  . Diabetes type II Other   . Diabetes type II Other   . Breast cancer Paternal Aunt     Social History Social History  Substance Use Topics  . Smoking status: Never Smoker  . Smokeless tobacco: Never Used  . Alcohol use No     Allergies   Ace inhibitors and Beta adrenergic blockers   Review of Systems Review of Systems  All other systems reviewed and are negative.    Physical Exam Updated Vital Signs BP (!) 175/92 (BP Location: Right Arm)   Pulse 64   Temp 98.4 F (36.9 C) (Oral)   Resp 20   SpO2 96%   Physical Exam  Constitutional: She is oriented to person, place, and time. She appears well-developed and well-nourished. No distress.  HENT:  Head: Normocephalic and atraumatic.  Eyes: EOM are normal.  Neck: Normal range of motion. Neck supple.  Cardiovascular: Normal rate and regular rhythm.   Pulmonary/Chest: Effort normal and breath sounds normal.  Right lateral chest wall tenderness without crepitus  Abdominal: Soft. She exhibits no distension.  Musculoskeletal: Normal range of motion.  Neurological: She is alert and oriented to person, place, and time.  Skin: Skin is warm and dry.  Psychiatric: She has a normal mood and affect. Judgment normal.  Nursing note and vitals reviewed.    ED Treatments / Results  Labs (all labs ordered are listed, but only abnormal results are displayed) Labs Reviewed - No data  to display  EKG  EKG Interpretation None       Radiology Dg Chest 2 View  Result Date: 01/14/2017 CLINICAL DATA:  Patient tripped and fell when trying to open a set of double the worse today. The patient fell onto her right side. The patient is complaining of right ribcage and back discomfort. EXAM: CHEST  2 VIEW COMPARISON:  Chest x-ray dated April 05, 2014 FINDINGS: The lungs are adequately inflated and clear. There is no evidence of a pulmonary contusion, pneumothorax, or pneumomediastinum. The heart and pulmonary vascularity are normal. There is calcification in the wall of the aortic arch. The observed ribs appear intact. There is multilevel degenerative disc disease of the thoracic spine. There degenerative changes of the right shoulder. IMPRESSION: There is no acute cardiopulmonary abnormality. No definite acute rib fracture is observed but the lower ribs are obscured. A right rib series is recommended if the patient's symptoms warrant further evaluation. Electronically Signed   By: David  Martinique M.D.   On: 01/14/2017 15:09   Dg Ribs Unilateral W/chest Right  Result Date: 01/14/2017 CLINICAL DATA:  Fall with right-sided chest and back pain. EXAM: RIGHT RIBS AND CHEST - 3+ VIEW COMPARISON:  Earlier same day FINDINGS: Chronic left ventricular prominence. Chronic aortic atherosclerosis. The lungs are clear. No pneumothorax or hemothorax. Right rib films show a questionable minimal fracture of the right lateral tenth rib. This is not certain. IMPRESSION: No active cardiopulmonary disease. No definite  rib fracture. Question nondisplaced fracture of the right lateral tenth rib. Electronically Signed   By: Nelson Chimes M.D.   On: 01/14/2017 15:55    Procedures Procedures (including critical care time)  Medications Ordered in ED Medications  morphine 4 MG/ML injection 4 mg (4 mg Intravenous Given 01/14/17 1511)     Initial Impression / Assessment and Plan / ED Course  I have reviewed the  triage vital signs and the nursing notes.  Pertinent labs & imaging results that were available during my care of the patient were reviewed by me and considered in my medical decision making (see chart for details).     Patient be treated as a rib fracture given her tenderness in the right lateral chest after fall with questionable 10th rib fracture.  Home with pain medicine and incentive spirometry.  Patient understands return the ER for new or worsening symptoms  Final Clinical Impressions(s) / ED Diagnoses   Final diagnoses:  Closed fracture of one rib of right side, initial encounter  Fall, initial encounter    New Prescriptions New Prescriptions   OXYCODONE-ACETAMINOPHEN (PERCOCET/ROXICET) 5-325 MG TABLET    Take 1 tablet by mouth every 4 (four) hours as needed for severe pain.     Jola Schmidt, MD 01/14/17 [id number]

## 2017-01-14 NOTE — ED Notes (Signed)
Patient transported to X-ray 

## 2017-01-17 ENCOUNTER — Encounter (HOSPITAL_COMMUNITY): Payer: Self-pay | Admitting: Emergency Medicine

## 2017-01-17 ENCOUNTER — Emergency Department (HOSPITAL_COMMUNITY): Payer: Medicare Other

## 2017-01-17 ENCOUNTER — Observation Stay (HOSPITAL_COMMUNITY)
Admission: EM | Admit: 2017-01-17 | Discharge: 2017-01-19 | Disposition: A | Discharge status: Skilled Nursing Facility | Payer: Medicare Other | Attending: Family Medicine | Admitting: Family Medicine

## 2017-01-17 DIAGNOSIS — Z66 Do not resuscitate: Secondary | ICD-10-CM | POA: Insufficient documentation

## 2017-01-17 DIAGNOSIS — R109 Unspecified abdominal pain: Secondary | ICD-10-CM | POA: Diagnosis present

## 2017-01-17 DIAGNOSIS — W19XXXA Unspecified fall, initial encounter: Secondary | ICD-10-CM | POA: Insufficient documentation

## 2017-01-17 DIAGNOSIS — E1122 Type 2 diabetes mellitus with diabetic chronic kidney disease: Secondary | ICD-10-CM | POA: Insufficient documentation

## 2017-01-17 DIAGNOSIS — E039 Hypothyroidism, unspecified: Secondary | ICD-10-CM | POA: Insufficient documentation

## 2017-01-17 DIAGNOSIS — N189 Chronic kidney disease, unspecified: Secondary | ICD-10-CM | POA: Diagnosis not present

## 2017-01-17 DIAGNOSIS — E1142 Type 2 diabetes mellitus with diabetic polyneuropathy: Secondary | ICD-10-CM | POA: Insufficient documentation

## 2017-01-17 DIAGNOSIS — I129 Hypertensive chronic kidney disease with stage 1 through stage 4 chronic kidney disease, or unspecified chronic kidney disease: Secondary | ICD-10-CM | POA: Insufficient documentation

## 2017-01-17 DIAGNOSIS — Z888 Allergy status to other drugs, medicaments and biological substances status: Secondary | ICD-10-CM | POA: Diagnosis not present

## 2017-01-17 DIAGNOSIS — Z7982 Long term (current) use of aspirin: Secondary | ICD-10-CM | POA: Insufficient documentation

## 2017-01-17 DIAGNOSIS — Z79899 Other long term (current) drug therapy: Secondary | ICD-10-CM | POA: Diagnosis not present

## 2017-01-17 DIAGNOSIS — C7970 Secondary malignant neoplasm of unspecified adrenal gland: Secondary | ICD-10-CM | POA: Insufficient documentation

## 2017-01-17 DIAGNOSIS — S2231XD Fracture of one rib, right side, subsequent encounter for fracture with routine healing: Secondary | ICD-10-CM

## 2017-01-17 DIAGNOSIS — Z86718 Personal history of other venous thrombosis and embolism: Secondary | ICD-10-CM | POA: Insufficient documentation

## 2017-01-17 DIAGNOSIS — Z96653 Presence of artificial knee joint, bilateral: Secondary | ICD-10-CM | POA: Diagnosis not present

## 2017-01-17 DIAGNOSIS — Z86711 Personal history of pulmonary embolism: Secondary | ICD-10-CM | POA: Diagnosis not present

## 2017-01-17 DIAGNOSIS — K8689 Other specified diseases of pancreas: Secondary | ICD-10-CM

## 2017-01-17 DIAGNOSIS — K219 Gastro-esophageal reflux disease without esophagitis: Secondary | ICD-10-CM | POA: Diagnosis not present

## 2017-01-17 DIAGNOSIS — R1011 Right upper quadrant pain: Secondary | ICD-10-CM

## 2017-01-17 DIAGNOSIS — D649 Anemia, unspecified: Secondary | ICD-10-CM | POA: Insufficient documentation

## 2017-01-17 DIAGNOSIS — K869 Disease of pancreas, unspecified: Secondary | ICD-10-CM | POA: Diagnosis not present

## 2017-01-17 DIAGNOSIS — W19XXXD Unspecified fall, subsequent encounter: Secondary | ICD-10-CM

## 2017-01-17 DIAGNOSIS — Z7984 Long term (current) use of oral hypoglycemic drugs: Secondary | ICD-10-CM | POA: Diagnosis not present

## 2017-01-17 DIAGNOSIS — S2231XA Fracture of one rib, right side, initial encounter for closed fracture: Principal | ICD-10-CM | POA: Insufficient documentation

## 2017-01-17 DIAGNOSIS — M549 Dorsalgia, unspecified: Secondary | ICD-10-CM

## 2017-01-17 LAB — RETICULOCYTES
RBC.: 3.88 MIL/uL (ref 3.87–5.11)
RETIC CT PCT: 1.2 % (ref 0.4–3.1)
Retic Count, Absolute: 46.6 10*3/uL (ref 19.0–186.0)

## 2017-01-17 LAB — I-STAT CHEM 8, ED
BUN: 30 mg/dL — AB (ref 6–20)
CHLORIDE: 105 mmol/L (ref 101–111)
CREATININE: 1.2 mg/dL — AB (ref 0.44–1.00)
Calcium, Ion: 1.14 mmol/L — ABNORMAL LOW (ref 1.15–1.40)
Glucose, Bld: 95 mg/dL (ref 65–99)
HEMATOCRIT: 33 % — AB (ref 36.0–46.0)
Hemoglobin: 11.2 g/dL — ABNORMAL LOW (ref 12.0–15.0)
Potassium: 4.2 mmol/L (ref 3.5–5.1)
SODIUM: 140 mmol/L (ref 135–145)
TCO2: 27 mmol/L (ref 0–100)

## 2017-01-17 LAB — CBC WITH DIFFERENTIAL/PLATELET
Basophils Absolute: 0 10*3/uL (ref 0.0–0.1)
Basophils Relative: 1 %
EOS PCT: 4 %
Eosinophils Absolute: 0.3 10*3/uL (ref 0.0–0.7)
HCT: 36.8 % (ref 36.0–46.0)
Hemoglobin: 11.6 g/dL — ABNORMAL LOW (ref 12.0–15.0)
LYMPHS ABS: 2 10*3/uL (ref 0.7–4.0)
LYMPHS PCT: 27 %
MCH: 28.9 pg (ref 26.0–34.0)
MCHC: 31.5 g/dL (ref 30.0–36.0)
MCV: 91.8 fL (ref 78.0–100.0)
MONO ABS: 0.7 10*3/uL (ref 0.1–1.0)
MONOS PCT: 9 %
Neutro Abs: 4.5 10*3/uL (ref 1.7–7.7)
Neutrophils Relative %: 59 %
PLATELETS: 212 10*3/uL (ref 150–400)
RBC: 4.01 MIL/uL (ref 3.87–5.11)
RDW: 14.1 % (ref 11.5–15.5)
WBC: 7.5 10*3/uL (ref 4.0–10.5)

## 2017-01-17 LAB — HEPATIC FUNCTION PANEL
ALBUMIN: 3.2 g/dL — AB (ref 3.5–5.0)
ALK PHOS: 36 U/L — AB (ref 38–126)
ALT: 24 U/L (ref 14–54)
AST: 22 U/L (ref 15–41)
BILIRUBIN TOTAL: 0.9 mg/dL (ref 0.3–1.2)
Bilirubin, Direct: 0.2 mg/dL (ref 0.1–0.5)
Indirect Bilirubin: 0.7 mg/dL (ref 0.3–0.9)
Total Protein: 5.7 g/dL — ABNORMAL LOW (ref 6.5–8.1)

## 2017-01-17 LAB — IRON AND TIBC
Iron: 38 ug/dL (ref 28–170)
Saturation Ratios: 15 % (ref 10.4–31.8)
TIBC: 259 ug/dL (ref 250–450)
UIBC: 221 ug/dL

## 2017-01-17 LAB — LIPASE, BLOOD: Lipase: 32 U/L (ref 11–51)

## 2017-01-17 LAB — T4, FREE: FREE T4: 1.01 ng/dL (ref 0.61–1.12)

## 2017-01-17 LAB — TROPONIN I

## 2017-01-17 LAB — VITAMIN B12: VITAMIN B 12: 546 pg/mL (ref 180–914)

## 2017-01-17 LAB — FOLATE: FOLATE: 34.5 ng/mL (ref >5.9)

## 2017-01-17 LAB — MAGNESIUM: MAGNESIUM: 2.1 mg/dL (ref 1.7–2.4)

## 2017-01-17 LAB — PHOSPHORUS: Phosphorus: 3.3 mg/dL (ref 2.5–4.6)

## 2017-01-17 LAB — FERRITIN: Ferritin: 142 ng/mL (ref 11–307)

## 2017-01-17 LAB — I-STAT CG4 LACTIC ACID, ED: Lactic Acid, Venous: 0.75 mmol/L (ref 0.5–1.9)

## 2017-01-17 LAB — TSH: TSH: 4.562 u[IU]/mL — ABNORMAL HIGH (ref 0.350–4.500)

## 2017-01-17 MED ORDER — ASPIRIN 81 MG PO CHEW
81.0000 mg | CHEWABLE_TABLET | Freq: Every day | ORAL | Status: DC
  Administered 2017-01-17 – 2017-01-19 (×3): 81 mg via ORAL
  Filled 2017-01-17 (×3): qty 1

## 2017-01-17 MED ORDER — LEVOTHYROXINE SODIUM 112 MCG PO TABS
112.0000 ug | ORAL_TABLET | Freq: Every day | ORAL | Status: DC
  Administered 2017-01-18 – 2017-01-19 (×2): 112 ug via ORAL
  Filled 2017-01-17 (×2): qty 1

## 2017-01-17 MED ORDER — SODIUM CHLORIDE 0.9 % IV SOLN
INTRAVENOUS | Status: DC
  Administered 2017-01-17: 11:00:00 via INTRAVENOUS

## 2017-01-17 MED ORDER — DEXTROSE-NACL 5-0.45 % IV SOLN
INTRAVENOUS | Status: DC
  Administered 2017-01-17 – 2017-01-19 (×2): via INTRAVENOUS

## 2017-01-17 MED ORDER — ENOXAPARIN SODIUM 40 MG/0.4ML ~~LOC~~ SOLN
40.0000 mg | SUBCUTANEOUS | Status: DC
  Administered 2017-01-18 – 2017-01-19 (×2): 40 mg via SUBCUTANEOUS
  Filled 2017-01-17 (×2): qty 0.4

## 2017-01-17 MED ORDER — SORBITOL 70 % SOLN
30.0000 mL | Freq: Every day | Status: DC | PRN
  Filled 2017-01-17: qty 30

## 2017-01-17 MED ORDER — MORPHINE SULFATE (PF) 4 MG/ML IV SOLN
4.0000 mg | INTRAVENOUS | Status: DC | PRN
  Administered 2017-01-17: 4 mg via INTRAVENOUS
  Filled 2017-01-17: qty 1

## 2017-01-17 MED ORDER — BISACODYL 10 MG RE SUPP: 10.0000 mg | Freq: Once | RECTAL | Status: DC

## 2017-01-17 MED ORDER — PANTOPRAZOLE SODIUM 40 MG PO TBEC
40.0000 mg | DELAYED_RELEASE_TABLET | Freq: Every day | ORAL | Status: DC
Start: 2017-01-17 — End: 2017-01-19
  Administered 2017-01-17 – 2017-01-19 (×3): 40 mg via ORAL
  Filled 2017-01-17 (×3): qty 1

## 2017-01-17 MED ORDER — TRAMADOL HCL 50 MG PO TABS
50.0000 mg | ORAL_TABLET | Freq: Four times a day (QID) | ORAL | Status: DC | PRN
  Administered 2017-01-18 (×2): 50 mg via ORAL
  Filled 2017-01-17 (×2): qty 1

## 2017-01-17 MED ORDER — IOPAMIDOL (ISOVUE-300) INJECTION 61%
INTRAVENOUS | Status: AC
  Administered 2017-01-17: 75 mL
  Filled 2017-01-17: qty 75

## 2017-01-17 MED ORDER — HYDROMORPHONE HCL 1 MG/ML IJ SOLN
1.0000 mg | Freq: Once | INTRAMUSCULAR | Status: AC
  Administered 2017-01-17: 1 mg via INTRAVENOUS
  Filled 2017-01-17: qty 1

## 2017-01-17 MED ORDER — ACETAMINOPHEN 325 MG PO TABS
650.0000 mg | ORAL_TABLET | Freq: Four times a day (QID) | ORAL | Status: DC | PRN
  Administered 2017-01-18 – 2017-01-19 (×4): 650 mg via ORAL
  Filled 2017-01-17 (×4): qty 2

## 2017-01-17 MED ORDER — INSULIN ASPART 100 UNIT/ML ~~LOC~~ SOLN: 0.0000 [IU] | Freq: Three times a day (TID) | SUBCUTANEOUS | Status: DC

## 2017-01-17 MED ORDER — MORPHINE SULFATE (PF) 4 MG/ML IV SOLN: 2.0000 mg | INTRAVENOUS | Status: DC | PRN

## 2017-01-17 NOTE — Progress Notes (Signed)
Received report on pt.

## 2017-01-17 NOTE — ED Triage Notes (Signed)
Pt reports here for a fall on Thursday, daignosed with 10th rib fracture. Pt reports pain to right side radiating to front. Pt concerned with hx of pancreatitis. Pt also has been belching frequently with nausea, pt has been taking percocet for pain. Pt has not had a BM since Thursday.

## 2017-01-17 NOTE — Consult Note (Signed)
Reason for Consult:RUQ abdominal pain Referring Physician: Dr Kerrin Mo  Amber Knox is an 83 y.o. female.  HPI: I was asked by the family medicine teaching service to see this patient with right upper quadrant abdominal pain. The patient and her family report that she fell a couple days ago and fractured a rib. Since then, she's been having worsening right-upper quadrant pain. She has no nausea or vomiting. She underwent a CT of the abdomen and pelvis.  This showed dilated intra-and extra hepatic bile duct without obvious stone.  She had calcifications in her pancreas and a nonspecific density in the head of her pancreas. There were no acute findings regarding her bowel or her other solid organs. Apparently, she was told back in 2015 that she had metastatic cancer and even had a biopsy. This, per the family report turned out to be negative. She has had a cholecystectomy. At admission, her laboratory data was basically normal with a normal white blood count, normal liver function tests, normal lipase, and a normal lactic acid level.  Past Medical History:  Diagnosis Date  . Colon polyps    Tubular Adenoma  . Diverticulosis   . DM type 2 (diabetes mellitus, type 2) ([place])   . DVT (deep venous thrombosis) ([place]) 12-31-13  . Gallstone   . GERD (gastroesophageal reflux disease)   . Gout   . Heart murmur   . Hiatal hernia   . Hx of osteoarthritis    bilateral knee osteoarthritis  -   bilateral knee replacement  . Hyperlipemia   . Hypertension   . Hypothyroidism   . Osteopenia    Lumbar spine  . Peripheral neuropathy   . Pulmonary embolus ([place]) 12-31-13    Past Surgical History:  Procedure Laterality Date  . ABDOMINAL HYSTERECTOMY     Excessive Bleeding (IUD)   . CARDIAC CATHETERIZATION  1995   [facility] (Normal)   . CARPAL TUNNEL RELEASE Right   . CATARACT EXTRACTION Right   . CHOLECYSTECTOMY     Gallstones   . COLONOSCOPY    . ESOPHAGOGASTRODUODENOSCOPY (EGD) WITH ESOPHAGEAL  DILATION    . JOINT REPLACEMENT Bilateral    Knees   . KNEE ARTHROPLASTY Bilateral   . LUMBAR LAMINECTOMY/DECOMPRESSION MICRODISCECTOMY N/A 02/16/2014   Procedure: Left Lumbar two/three Microdiskectomy, Right Lumbar four/five decompression lumbar laminectomy  ;  Surgeon: Charlie Pitter, MD;  Location: [place] [facility];  Service: Neurosurgery;  Laterality: N/A;  . TONSILLECTOMY      Family History  Problem Relation Age of Onset  . Vascular Disease Father     Cardio  . Diabetes type II Other   . Diabetes type II Other   . Breast cancer Paternal Aunt     Social History:  reports that she has never smoked. She has never used smokeless tobacco. She reports that she does not drink alcohol or use drugs.  Allergies:  Allergies  Allergen Reactions  . Ace Inhibitors Swelling    Laryngeal Edema   . Beta Adrenergic Blockers Swelling    Tongue swelling   . Statins Swelling    Tongue swelling     Medications: I have reviewed the patient's current medications.  Results for orders placed or performed during the hospital encounter of 01/17/17 (from the past 48 hour(s))  Hepatic function panel     Status: Abnormal   Collection Time: 01/17/17 10:53 AM  Result Value Ref Range   Total Protein 5.7 (L) 6.5 - 8.1 g/dL   Albumin 3.2 (  L) 3.5 - 5.0 g/dL   AST 22 15 - 41 U/L   ALT 24 14 - 54 U/L   Alkaline Phosphatase 36 (L) 38 - 126 U/L   Total Bilirubin 0.9 0.3 - 1.2 mg/dL   Bilirubin, Direct 0.2 0.1 - 0.5 mg/dL   Indirect Bilirubin 0.7 0.3 - 0.9 mg/dL  Lipase, blood     Status: None   Collection Time: 01/17/17 10:53 AM  Result Value Ref Range   Lipase 32 11 - 51 U/L  CBC WITH DIFFERENTIAL     Status: Abnormal   Collection Time: 01/17/17 10:53 AM  Result Value Ref Range   WBC 7.5 4.0 - 10.5 K/uL   RBC 4.01 3.87 - 5.11 MIL/uL   Hemoglobin 11.6 (L) 12.0 - 15.0 g/dL   HCT 36.8 36.0 - 46.0 %   MCV 91.8 78.0 - 100.0 fL   MCH 28.9 26.0 - 34.0 pg   MCHC 31.5 30.0 - 36.0 g/dL   RDW 14.1 11.5 -  15.5 %   Platelets 212 150 - 400 K/uL   Neutrophils Relative % 59 %   Neutro Abs 4.5 1.7 - 7.7 K/uL   Lymphocytes Relative 27 %   Lymphs Abs 2.0 0.7 - 4.0 K/uL   Monocytes Relative 9 %   Monocytes Absolute 0.7 0.1 - 1.0 K/uL   Eosinophils Relative 4 %   Eosinophils Absolute 0.3 0.0 - 0.7 K/uL   Basophils Relative 1 %   Basophils Absolute 0.0 0.0 - 0.1 K/uL  I-Stat Chem 8 ED  (not at [facility])     Status: Abnormal   Collection Time: 01/17/17 11:25 AM  Result Value Ref Range   Sodium 140 135 - 145 mmol/L   Potassium 4.2 3.5 - 5.1 mmol/L   Chloride 105 101 - 111 mmol/L   BUN 30 (H) 6 - 20 mg/dL   Creatinine, Ser 1.20 (H) 0.44 - 1.00 mg/dL   Glucose, Bld 95 65 - 99 mg/dL   Calcium, Ion 1.14 (L) 1.15 - 1.40 mmol/L   TCO2 27 0 - 100 mmol/L   Hemoglobin 11.2 (L) 12.0 - 15.0 g/dL   HCT 33.0 (L) 36.0 - 46.0 %  I-Stat CG4 Lactic Acid, ED  (not at [facility])     Status: None   Collection Time: 01/17/17 11:26 AM  Result Value Ref Range   Lactic Acid, Venous 0.75 0.5 - 1.9 mmol/L  Magnesium     Status: None   Collection Time: 01/17/17  6:06 PM  Result Value Ref Range   Magnesium 2.1 1.7 - 2.4 mg/dL  Phosphorus     Status: None   Collection Time: 01/17/17  6:06 PM  Result Value Ref Range   Phosphorus 3.3 2.5 - 4.6 mg/dL  TSH     Status: Abnormal   Collection Time: 01/17/17  6:06 PM  Result Value Ref Range   TSH 4.562 (H) 0.350 - 4.500 uIU/mL    Comment: Performed by a 3rd Generation assay with a functional sensitivity of <=0.01 uIU/mL.  Troponin I     Status: None   Collection Time: 01/17/17  6:06 PM  Result Value Ref Range   Troponin I <0.03 <0.03 ng/mL    Dg Chest 2 View  Result Date: 01/17/2017 CLINICAL DATA:  Right upper quadrant pain and back pain. EXAM: CHEST  2 VIEW COMPARISON:  01/14/2017 FINDINGS: Mild cardiac enlargement. Aortic atherosclerosis noted. There is no pleural effusion or edema. No airspace opacities. Spondylosis noted throughout the thoracic spine. Bilateral  glenohumeral joint osteoarthritis, right greater in left. There is also bilateral AC joint osteoarthritis. IMPRESSION: No active cardiopulmonary disease. Electronically Signed   By: Kerby Moors M.D.   On: 01/17/2017 12:53   Ct Abdomen Pelvis W Contrast  Result Date: 01/17/2017 CLINICAL DATA:  Recent fall.  Abdominal pain and back pain. EXAM: CT ABDOMEN AND PELVIS WITH CONTRAST TECHNIQUE: Multidetector CT imaging of the abdomen and pelvis was performed using the standard protocol following bolus administration of intravenous contrast. CONTRAST:  86mL ISOVUE-300 IOPAMIDOL (ISOVUE-300) INJECTION 61% COMPARISON:  PET-CT 01/10/2014 FINDINGS: Lower chest: Small right pleural effusion with overlying atelectasis. Hepatobiliary: Previous cholecystectomy. Moderate intrahepatic bile duct dilatation. There is fusiform dilatation of the common bile duct which measures up to 11 mm. No obstructing stone or mass noted. Pancreas: Calcifications within head of pancreas identified. No inflammation identified. Small low attenuation structure within the pancreatic head measures 1.2 cm, image 105 of series 6. Spleen: Normal in size without focal abnormality. Adrenals/Urinary Tract: The left adrenal gland appears normal. Right adrenal adenoma is again noted measuring 3.3 cm. Multiple bilateral renal cysts are identified. Cyst with a possible 1 cm solid mural nodule arises lateral cortex of the left kidney, image 38 of series 3. No hydronephrosis identified. Urinary bladder appears normal. Stomach/Bowel: Stomach is normal. There is gaseous distension of the small bowel loops. No pathologic dilatation of the small bowel noted. The appendix is visualized and appears normal. Gas and stool noted throughout normal caliber colon. No abnormal bowel wall thickening or inflammation identified. Vascular/Lymphatic: Aortic atherosclerosis. No aneurysm. No abdominal or pelvic adenopathy. Reproductive: Status post hysterectomy. No adnexal masses.  Other: No abdominal wall hernia or abnormality. No abdominopelvic ascites. Musculoskeletal: Degenerative disc disease identified within the lower thoracic and the lumbar spine. L2 inferior endplate fracture appears stable. IMPRESSION: 1. No posttraumatic findings identified within the abdomen or pelvis. 2. There is mild diffuse gaseous distension of the small bowel loops without pathologic dilatation. Findings may be secondary to small bowel ileus/enteritis. 3. Increase caliber of the intrahepatic bile ducts and common bile duct status post cholecystectomy. No stone or mass identified. There are calcifications noted within head of pancreas which may reflect sequelae of chronic pancreatitis which could conceivably result in distal common bile duct narrowing. More definitive assessment for underlying mass lesion or stone may be obtained with contrast enhanced MRI/MRCP. 4. Small low density structure within the head of pancreas is identified measuring 1.2 cm. Nonspecific. In the setting of chronic pancreatitis this may represent a small pseudocyst. More definitive assessment with contrast enhanced MRI of the abdomen is advise. 5. Bilateral kidney cysts. A cyst within the lateral left kidney contains a possible solid mural nodule. This could also be better assessed with MRI. 6.  Aortic Atherosclerosis (ICD10-I70.0). 7. Lumbar spondylosis with stable, chronic L2 inferior endplate fracture. Electronically Signed   By: Kerby Moors M.D.   On: 01/17/2017 12:49    Review of Systems  All other systems reviewed and are negative.  Blood pressure (!) 146/53, pulse 62, temperature 99 F (37.2 C), temperature source Oral, resp. rate 18, SpO2 96 %. Physical Exam  Constitutional: She appears well-developed and well-nourished. No distress.  GI:  Even before I examined her abdomen, she cried out in pain and reported tenderness. This tenderness is along the costal margin. When she is distracted, her abdomen is actually  soft and fairly benign. There are no masses.  Skin: She is not diaphoretic.    Assessment/Plan: Abdominal pain of uncertain etiology.  I suspect all of her pain and discomfort are from the suspected broken 10th rib. I do not believe the findings on the CT scan are acute in nature. This should not be causing the type of pain she is experiencing or even tenderness on physical examination. Nonetheless, I agree with radiology that she should have an MRI to evaluate the pancreas and bile ducts. I definitely do not think she has an acute intra-abdominal process or bowel perforation. From a general surgical standpoint, she may have a diet. All of her discomfort may just be from the rib or muscle spasm.  Again, there is no need for any acute general surgical intervention.  We will follow for now but really have little more to offer right now.   Markea Ruzich A 01/17/2017, 8:03 PM

## 2017-01-17 NOTE — Progress Notes (Signed)
Amber Knox is a 83 y.o. female patient admitted from ED awake, alert - oriented  X 4 - no acute distress noted.  VSS - Blood pressure (!) 157/65, pulse 62, temperature 98.2 F (36.8 C), resp. rate 18, SpO2 98 %.    IV in place, occlusive dsg intact without redness.  Orientation to room, and floor completed with information packet given to patient/family.  Patient declined safety video at this time.  Admission INP armband ID verified with patient/family, and in place.   SR up x 2, fall assessment complete, with patient and family able to verbalize understanding of risk associated with falls, and verbalized understanding to call nsg before up out of bed.  Call light within reach, patient able to voice, and demonstrate understanding.  Skin, clean-dry- intact without evidence of bruising, or skin tears.  Small scab on outside of right ankle. No evidence of skin break down noted on exam.     Will cont to eval and treat per MD orders.  Celine Ahr, RN 01/17/2017 5:44 PM

## 2017-01-17 NOTE — ED Provider Notes (Signed)
[facility] DEPT Provider Note   CSN: [id number] Arrival date & time: 01/17/17  7510     History   Chief Complaint Chief Complaint  Patient presents with  . Fall  . Nausea    HPI Amber Knox is a 83 y.o. female.  HPI Pt comes in with cc of abd pain. Pt has hx of DM 2, cholecystectomy and appendectomy, remote hx of pancreatitis and DVT. She reports that she woke up this morning with severe R sided abd pain. Her pain is moving to the epigastric region and to the R flank region. Pain is sharp, constant with no n/v/f/c/diarrhea. Last BM was on thursday. Pt is passing flatus. Pt is burping a lot more than usual. She has no chest pain, dib.    Past Medical History:  Diagnosis Date  . Colon polyps    Tubular Adenoma  . Diverticulosis   . DM type 2 (diabetes mellitus, type 2) ([place])   . DVT (deep venous thrombosis) ([place]) 12-31-13  . Gallstone   . GERD (gastroesophageal reflux disease)   . Gout   . Heart murmur   . Hiatal hernia   . Hx of osteoarthritis    bilateral knee osteoarthritis  -   bilateral knee replacement  . Hyperlipemia   . Hypertension   . Hypothyroidism   . Osteopenia    Lumbar spine  . Peripheral neuropathy   . Pulmonary embolus [facility]) 12-31-13    Patient Active Problem List   Diagnosis Date Noted  . Altered mental status 04/05/2014  . Acute encephalopathy 04/05/2014  . SOB (shortness of breath) 04/05/2014  . ARF (acute renal failure) ([place]) 04/05/2014  . Pulmonary embolism ([place]) 04/05/2014  . HNP (herniated nucleus pulposus), lumbar 02/16/2014  . Lumbar disc herniation with radiculopathy 02/16/2014  . Metastatic cancer ([place]) 01/05/2014  . Palliative care encounter 01/02/2014  . Pain of back and right lower extremity 01/02/2014  . Metastasis to adrenal gland of unknown origin ([place]) 01/01/2014  . Acute massive pulmonary embolism ([place]) 12/31/2013  . Shock circulatory ([place]) 12/31/2013  . Acute respiratory failure ([place]) 12/31/2013  . Lumbar disc  disease 12/31/2013  . Fracture of femoral condyle, left, closed ([place]) 11/17/2013  . Nausea alone 11/17/2013  . Abnormality of gait 11/17/2013  . Back pain 11/17/2013  . Pain in joint, lower leg 10/30/2013  . Unspecified hypothyroidism 09/30/2013  . Type II or unspecified type diabetes mellitus without mention of complication, not stated as uncontrolled 09/30/2013  . Anemia 09/30/2013  . DM type 2 (diabetes mellitus, type 2) ([place])   . Hyperlipemia   . Hypertension   . Peripheral neuropathy   . Osteopenia   . Hx of osteoarthritis   . Hiatal hernia   . GERD (gastroesophageal reflux disease)   . Kidney cysts   . Colon polyps   . Diverticulosis     Past Surgical History:  Procedure Laterality Date  . ABDOMINAL HYSTERECTOMY     Excessive Bleeding (IUD)   . CARDIAC CATHETERIZATION  1995   [facility] (Normal)   . CARPAL TUNNEL RELEASE Right   . CATARACT EXTRACTION Right   . CHOLECYSTECTOMY     Gallstones   . COLONOSCOPY    . ESOPHAGOGASTRODUODENOSCOPY (EGD) WITH ESOPHAGEAL DILATION    . JOINT REPLACEMENT Bilateral    Knees   . KNEE ARTHROPLASTY Bilateral   . LUMBAR LAMINECTOMY/DECOMPRESSION MICRODISCECTOMY N/A 02/16/2014   Procedure: Left Lumbar two/three Microdiskectomy, Right Lumbar four/five decompression lumbar laminectomy  ;  Surgeon: Charlie Pitter,  MD;  Location: [place];  Service: Neurosurgery;  Laterality: N/A;  . TONSILLECTOMY      OB History    No data available       Home Medications    Prior to Admission medications   Medication Sig Start Date End Date Taking? Authorizing Provider  aspirin 81 MG chewable tablet Chew by mouth daily.   Yes [provider]  Chlorpheniramine Maleate (ALLERGY PO) Take 1 tablet by mouth daily as needed.   Yes [provider]  Dulaglutide 1.5 MG/0.5ML SOPN Inject 1.5 mg into the skin every 7 (seven) days. Thursday 11/02/16 11/03/17 Yes [provider]  ibuprofen (ADVIL,MOTRIN) 200 MG tablet Take 600 mg by mouth  every 6 (six) hours as needed for mild pain.   Yes [provider]  irbesartan (AVAPRO) 300 MG tablet Take 300 mg by mouth daily.   Yes [provider]  levothyroxine (SYNTHROID, LEVOTHROID) 112 MCG tablet Take 1 tablet (112 mcg total) by mouth daily. 11/02/13 01/17/17 Yes Zanard, Bernadene Bell, MD  Multiple Vitamin (MULTI-VITAMIN PO) Take 1 tablet by mouth daily.   Yes [provider]  oxyCODONE-acetaminophen (PERCOCET/ROXICET) 5-325 MG tablet Take 1 tablet by mouth every 4 (four) hours as needed for severe pain. 01/14/17  Yes Jola Schmidt, MD  dexlansoprazole (DEXILANT) 60 MG capsule Take 1 capsule (60 mg total) by mouth daily. 02/26/14 02/27/15  Jonathon Resides, MD    Family History Family History  Problem Relation Age of Onset  . Vascular Disease Father     Cardio  . Diabetes type II Other   . Diabetes type II Other   . Breast cancer Paternal Aunt     Social History Social History  Substance Use Topics  . Smoking status: Never Smoker  . Smokeless tobacco: Never Used  . Alcohol use No     Allergies   Ace inhibitors; Beta adrenergic blockers; and Statins   Review of Systems Review of Systems  Constitutional: Positive for activity change.  Respiratory: Negative for shortness of breath.   Cardiovascular: Negative for chest pain.  Gastrointestinal: Positive for abdominal pain.  All other systems reviewed and are negative.    Physical Exam Updated Vital Signs BP (!) 141/64   Pulse 61   Temp 98.2 F (36.8 C)   Resp 18   SpO2 96%   Physical Exam  Constitutional: She is oriented to person, place, and time. She appears well-developed.  HENT:  Head: Normocephalic and atraumatic.  Eyes: EOM are normal.  Neck: Normal range of motion. Neck supple.  Cardiovascular: Normal rate.   Pulmonary/Chest: Effort normal.  Abdominal: Soft. Bowel sounds are normal. She exhibits no mass. There is tenderness. There is rebound and guarding.  Neurological: She is alert  and oriented to person, place, and time.  Skin: Skin is warm and dry.  Nursing note and vitals reviewed.    ED Treatments / Results  Labs (all labs ordered are listed, but only abnormal results are displayed) Labs Reviewed  HEPATIC FUNCTION PANEL - Abnormal; Notable for the following:       Result Value   Total Protein 5.7 (*)    Albumin 3.2 (*)    Alkaline Phosphatase 36 (*)    All other components within normal limits  CBC WITH DIFFERENTIAL/PLATELET - Abnormal; Notable for the following:    Hemoglobin 11.6 (*)    All other components within normal limits  I-STAT CHEM 8, ED - Abnormal; Notable for the following:  BUN 30 (*)    Creatinine, Ser 1.20 (*)    Calcium, Ion 1.14 (*)    Hemoglobin 11.2 (*)    HCT 33.0 (*)    All other components within normal limits  LIPASE, BLOOD  URINALYSIS, ROUTINE W REFLEX MICROSCOPIC  I-STAT CG4 LACTIC ACID, ED    EKG  EKG Interpretation None       Radiology Dg Chest 2 View  Result Date: 01/17/2017 CLINICAL DATA:  Right upper quadrant pain and back pain. EXAM: CHEST  2 VIEW COMPARISON:  01/14/2017 FINDINGS: Mild cardiac enlargement. Aortic atherosclerosis noted. There is no pleural effusion or edema. No airspace opacities. Spondylosis noted throughout the thoracic spine. Bilateral glenohumeral joint osteoarthritis, right greater in left. There is also bilateral AC joint osteoarthritis. IMPRESSION: No active cardiopulmonary disease. Electronically Signed   By: Kerby Moors M.D.   On: 01/17/2017 12:53   Ct Abdomen Pelvis W Contrast  Result Date: 01/17/2017 CLINICAL DATA:  Recent fall.  Abdominal pain and back pain. EXAM: CT ABDOMEN AND PELVIS WITH CONTRAST TECHNIQUE: Multidetector CT imaging of the abdomen and pelvis was performed using the standard protocol following bolus administration of intravenous contrast. CONTRAST:  48mL ISOVUE-300 IOPAMIDOL (ISOVUE-300) INJECTION 61% COMPARISON:  PET-CT 01/10/2014 FINDINGS: Lower chest: Small right  pleural effusion with overlying atelectasis. Hepatobiliary: Previous cholecystectomy. Moderate intrahepatic bile duct dilatation. There is fusiform dilatation of the common bile duct which measures up to 11 mm. No obstructing stone or mass noted. Pancreas: Calcifications within head of pancreas identified. No inflammation identified. Small low attenuation structure within the pancreatic head measures 1.2 cm, image 105 of series 6. Spleen: Normal in size without focal abnormality. Adrenals/Urinary Tract: The left adrenal gland appears normal. Right adrenal adenoma is again noted measuring 3.3 cm. Multiple bilateral renal cysts are identified. Cyst with a possible 1 cm solid mural nodule arises lateral cortex of the left kidney, image 38 of series 3. No hydronephrosis identified. Urinary bladder appears normal. Stomach/Bowel: Stomach is normal. There is gaseous distension of the small bowel loops. No pathologic dilatation of the small bowel noted. The appendix is visualized and appears normal. Gas and stool noted throughout normal caliber colon. No abnormal bowel wall thickening or inflammation identified. Vascular/Lymphatic: Aortic atherosclerosis. No aneurysm. No abdominal or pelvic adenopathy. Reproductive: Status post hysterectomy. No adnexal masses. Other: No abdominal wall hernia or abnormality. No abdominopelvic ascites. Musculoskeletal: Degenerative disc disease identified within the lower thoracic and the lumbar spine. L2 inferior endplate fracture appears stable. IMPRESSION: 1. No posttraumatic findings identified within the abdomen or pelvis. 2. There is mild diffuse gaseous distension of the small bowel loops without pathologic dilatation. Findings may be secondary to small bowel ileus/enteritis. 3. Increase caliber of the intrahepatic bile ducts and common bile duct status post cholecystectomy. No stone or mass identified. There are calcifications noted within head of pancreas which may reflect sequelae  of chronic pancreatitis which could conceivably result in distal common bile duct narrowing. More definitive assessment for underlying mass lesion or stone may be obtained with contrast enhanced MRI/MRCP. 4. Small low density structure within the head of pancreas is identified measuring 1.2 cm. Nonspecific. In the setting of chronic pancreatitis this may represent a small pseudocyst. More definitive assessment with contrast enhanced MRI of the abdomen is advise. 5. Bilateral kidney cysts. A cyst within the lateral left kidney contains a possible solid mural nodule. This could also be better assessed with MRI. 6.  Aortic Atherosclerosis (ICD10-I70.0). 7. Lumbar spondylosis with stable, chronic L2  inferior endplate fracture. Electronically Signed   By: Kerby Moors M.D.   On: 01/17/2017 12:49    Procedures Procedures (including critical care time)  EMERGENCY DEPARTMENT [place] FAST EXAM "Limited Ultrasound of the Abdomen and Pericardium" (FAST Exam).   INDICATIONS:Blunt injury of abdomen, severe abdominal tenderness after a recent fall Multiple views of the abdomen and pericardium are obtained with a multi-frequency probe.  PERFORMED BY: Myself IMAGES ARCHIVED?: Yes LIMITATIONS:  Severe tenderness, refused subcostal cardiac view INTERPRETATION:  No abdominal free fluid    Medications Ordered in ED Medications  0.9 %  sodium chloride infusion ( Intravenous New Bag/Given 01/17/17 1111)  morphine 4 MG/ML injection 4 mg (not administered)  HYDROmorphone (DILAUDID) injection 1 mg (1 mg Intravenous Given 01/17/17 1113)  iopamidol (ISOVUE-300) 61 % injection (75 mLs  Contrast Given 01/17/17 1202)     Initial Impression / Assessment and Plan / ED Course  I have reviewed the triage vital signs and the nursing notes.  Pertinent labs & imaging results that were available during my care of the patient were reviewed by me and considered in my medical decision making (see chart for details).  Clinical  Course as of Jan 17 1549  Sun Jan 17, 2017  1550 Results from the ER workup discussed with the patient face to face and all questions answered to the best of my ability.  CT Abdomen Pelvis W Contrast [AN]    Clinical Course User Index [AN] Varney Biles, MD   Pt comes in with severe abd tenderness. Recent fall, with rib fracture. FAST exam neg for free fluid. L kidney looked abnormal - but pt reports that she has hx of cyst. CT scan of the abdomen ordered for the severe tenderenss with concerns for choledocholithiasis and pancreatitis. CT is + for possible CBD dilitation. Pain is persistent and severe.  Gi consulted. Medicine to admit.   Final Clinical Impressions(s) / ED Diagnoses   Final diagnoses:  Pancreatic mass  RUQ abdominal pain    New Prescriptions New Prescriptions   No medications on file     Varney Biles, MD 01/17/17 1550

## 2017-01-17 NOTE — H&P (Signed)
[facility] Admission History and Physical Service Pager: [phone]  Patient name: Amber Knox record number: [id number] Date of birth: 1929-09-22 Age: 83 y.o. Gender: female  Primary Care Provider: Jonathon Resides, MD Consultants: GI  Code Status: DNR  Chief Complaint: RUQ abdominal pain   Assessment and Plan: Amber Knox is a 83 y.o. female presenting with RUQ abdominal pain. PMH is significant for history of PE and DVT, multiple fractures, HLD, HTN, T2DM, GERD, hypothyroidism,    Right upper quadrant abdominal pain:  Worsening RUQ abdominal pain over the past several days, significantly worsened today with radiation to the back and right shoulder, recently treated onf 5/3 for right 10 th rib fracture, received percocet for this. Slight nausea, no vomiting. No fevers, rigors. No urinary symptoms. WBC wnl. Lipase 32. AST/ALT wnl. LA 0.75. Ionized calcium 1.14. CT abdomen/pelvis- mild diffuse gaseous distention of the small bowels possibly due to ileus. Calcification noted at the head of the pancreas, along with 1.2 cm low density structure, intrahepatic bile duct and common bile that enlarged. Bilirubin not elevated. History of pancreatitis 1, therefore unlikely due to chronic pancreatitis. Differential for pain: pancreatic mass vs. ileus vs. severe constipation vs choledocholithiasis  - Admit to inpatient MedSurg attending Dr. Andria Frames  - D5 1/2NS @ 100 cc for 12 hours  - obtain right upper quadrant ultrasound  - CBC and CMET in the AM  - Consult to GI in the AM for possibly MRCP in the AM - TSH/Hemglobin A1C  - Consulted surgery  - Continue home PPI  - Consider ducolax suppository  - Keep NPO, sips with meds  - Hold on opioids 2/2 ileus, tylenol for pain   Hypothyriodism  - Check TSH, T4  - Continue home synthriod   HTN - hold ARB in setting of possibly dehydration - consider restarting irbesartan tomorrow  T2DM:Last A1C 5.1 on 11/02/2016.  Home [place]  - SSI - CBGs ACQHS   Osteoporosis: Hx of several fracture in the past. Per care everywhere receiving risedronate, though not noted in our system   Hx of Pulmonary embolism/DVT: prior to back surgery in 2015, developed PE and DVT. On xarelto for 6 months then stopped    Anemia: hx of normal colonoscopy noted in 2013> Hgb 12.2 in 2017 - Consider an FOBT   - Obtain an Anemia panel   CKD: Baseline btw 1-1.2. SCr 1.2  - Continue trending BMET   FEN/GI: NPO with sips  Prophylaxis: Lovenox   Disposition: Home    History of Present Illness:  Amber Knox is a 83 y.o. female presenting with abdominal pain. She states on Thursday afternoon she was at school where she had a mechanical fall, she is seen in the ED on 5/3 for this fall and was noted to have a 10th rib fracture. She was sent home on with Percocet. She noted that she had been having progressive pain in the right upper quadrant thereafter, and this morning had a pain that was different from the previous, very severe. She states that pain radiated to her back and at times to her right shoulder. She denies any fevers or chills associated with this. Has only felt very slightly nauseated this afternoon and thinks that due to not having any food. Denies any vomiting. She has a history of pancreatitis 1. An states that this is similar in pain to that episode of pancreatitis she had. She has had her gallbladder removed. Previously noted on patient's  problem list was metastatic cancer, however this is not correct. Denies any other symptoms. Denies any diarrhea. Indicates has not had a bowel movement in 3-4 days. Denies any blood in her stools.  Review Of Systems: Per HPI with the following additions:  Review of Systems  Constitutional: Negative for chills and fever.  HENT: Negative for hearing loss and tinnitus.   Eyes: Negative for blurred vision and double vision.  Respiratory: Negative for cough and shortness of breath.    Cardiovascular: Negative for chest pain.  Gastrointestinal: Positive for nausea. Negative for abdominal pain, blood in stool, constipation and vomiting.  Genitourinary: Negative for dysuria, frequency and urgency.  Skin: Negative for itching and rash.  Neurological: Negative for dizziness and headaches.    Patient Active Problem List   Diagnosis Date Noted  . Abdominal pain 01/17/2017  . Altered mental status 04/05/2014  . Acute encephalopathy 04/05/2014  . SOB (shortness of breath) 04/05/2014  . ARF (acute renal failure) ([place]) 04/05/2014  . Pulmonary embolism ([place]) 04/05/2014  . HNP (herniated nucleus pulposus), lumbar 02/16/2014  . Lumbar disc herniation with radiculopathy 02/16/2014  . Palliative care encounter 01/02/2014  . Pain of back and right lower extremity 01/02/2014  . Acute massive pulmonary embolism ([place]) 12/31/2013  . Shock circulatory ([place]) 12/31/2013  . Acute respiratory failure ([place]) 12/31/2013  . Lumbar disc disease 12/31/2013  . Fracture of femoral condyle, left, closed ([place]) 11/17/2013  . Nausea alone 11/17/2013  . Abnormality of gait 11/17/2013  . Back pain 11/17/2013  . Pain in joint, lower leg 10/30/2013  . Unspecified hypothyroidism 09/30/2013  . Type II or unspecified type diabetes mellitus without mention of complication, not stated as uncontrolled 09/30/2013  . Anemia 09/30/2013  . DM type 2 (diabetes mellitus, type 2) ([place])   . Hyperlipemia   . Hypertension   . Peripheral neuropathy   . Osteopenia   . Hx of osteoarthritis   . Hiatal hernia   . GERD (gastroesophageal reflux disease)   . Kidney cysts   . Colon polyps   . Diverticulosis     Past Medical History: Past Medical History:  Diagnosis Date  . Colon polyps    Tubular Adenoma  . Diverticulosis   . DM type 2 (diabetes mellitus, type 2) ([place])   . DVT (deep venous thrombosis) ([place]) 12-31-13  . Gallstone   . GERD (gastroesophageal reflux disease)   . Gout   . Heart murmur   .  Hiatal hernia   . Hx of osteoarthritis    bilateral knee osteoarthritis  -   bilateral knee replacement  . Hyperlipemia   . Hypertension   . Hypothyroidism   . Osteopenia    Lumbar spine  . Peripheral neuropathy   . Pulmonary embolus ([place]) 12-31-13    Past Surgical History: Past Surgical History:  Procedure Laterality Date  . ABDOMINAL HYSTERECTOMY     Excessive Bleeding (IUD)   . CARDIAC CATHETERIZATION  1995   [facility] (Normal)   . CARPAL TUNNEL RELEASE Right   . CATARACT EXTRACTION Right   . CHOLECYSTECTOMY     Gallstones   . COLONOSCOPY    . ESOPHAGOGASTRODUODENOSCOPY (EGD) WITH ESOPHAGEAL DILATION    . JOINT REPLACEMENT Bilateral    Knees   . KNEE ARTHROPLASTY Bilateral   . LUMBAR LAMINECTOMY/DECOMPRESSION MICRODISCECTOMY N/A 02/16/2014   Procedure: Left Lumbar two/three Microdiskectomy, Right Lumbar four/five decompression lumbar laminectomy  ;  Surgeon: Charlie Pitter, MD;  Location: [place] [facility];  Service: Neurosurgery;  Laterality:  N/A;  . TONSILLECTOMY      Social History: Social History  Substance Use Topics  . Smoking status: Never Smoker  . Smokeless tobacco: Never Used  . Alcohol use No   Additional social history:  Please also refer to relevant sections of EMR.  Family History: Family History  Problem Relation Age of Onset  . Vascular Disease Father     Cardio  . Diabetes type II Other   . Diabetes type II Other   . Breast cancer Paternal Aunt      Allergies and Medications: Allergies  Allergen Reactions  . Ace Inhibitors Swelling    Laryngeal Edema   . Beta Adrenergic Blockers Swelling    Tongue swelling   . Statins Swelling    Tongue swelling    No current facility-administered medications on file prior to encounter.    Current Outpatient Prescriptions on File Prior to Encounter  Medication Sig Dispense Refill  . levothyroxine (SYNTHROID, LEVOTHROID) 112 MCG tablet Take 1 tablet (112 mcg total) by mouth daily. 90 tablet 3  .  oxyCODONE-acetaminophen (PERCOCET/ROXICET) 5-325 MG tablet Take 1 tablet by mouth every 4 (four) hours as needed for severe pain. 15 tablet 0  . dexlansoprazole (DEXILANT) 60 MG capsule Take 1 capsule (60 mg total) by mouth daily. 30 capsule 11    Objective: BP (!) 146/53 (BP Location: Right Arm)   Pulse 62   Temp 99 F (37.2 C) (Oral)   Resp 18   SpO2 96%  Exam: General: Elderly female, NAD  Eyes: PERRL ENTM: Dry mucosa membranes  Neck: No lymphadenopathy noted Cardiovascular:RRR, no murmurs, rubs or gallops noted  Respiratory: CTAB, no increased WOB, no wheezes, no rhonchi  Gastrointestinal: BS+, severe tenderness upon palpation in the right upper quadrant, slight distention, positive murphy's sign MSK: No pitting edema noted  Derm: No rash or ulcers  Neuro: 5/5 strength in upper and lower extremities  PsychCognition and judgment appear intact.   Labs and Imaging: CBC BMET   Recent Labs Lab 01/17/17 1053 01/17/17 1125  WBC 7.5  --   HGB 11.6* 11.2*  HCT 36.8 33.0*  PLT 212  --     Recent Labs Lab 01/17/17 1125  NA 140  K 4.2  CL 105  BUN 30*  CREATININE 1.20*  GLUCOSE 95     Dg Chest 2 View  Result Date: 01/17/2017 CLINICAL DATA:  Right upper quadrant pain and back pain. EXAM: CHEST  2 VIEW COMPARISON:  01/14/2017 FINDINGS: Mild cardiac enlargement. Aortic atherosclerosis noted. There is no pleural effusion or edema. No airspace opacities. Spondylosis noted throughout the thoracic spine. Bilateral glenohumeral joint osteoarthritis, right greater in left. There is also bilateral AC joint osteoarthritis. IMPRESSION: No active cardiopulmonary disease. Electronically Signed   By: Kerby Moors M.D.   On: 01/17/2017 12:53   Ct Abdomen Pelvis W Contrast  Result Date: 01/17/2017 CLINICAL DATA:  Recent fall.  Abdominal pain and back pain. EXAM: CT ABDOMEN AND PELVIS WITH CONTRAST TECHNIQUE: Multidetector CT imaging of the abdomen and pelvis was performed using the  standard protocol following bolus administration of intravenous contrast. CONTRAST:  14mL ISOVUE-300 IOPAMIDOL (ISOVUE-300) INJECTION 61% COMPARISON:  PET-CT 01/10/2014 FINDINGS: Lower chest: Small right pleural effusion with overlying atelectasis. Hepatobiliary: Previous cholecystectomy. Moderate intrahepatic bile duct dilatation. There is fusiform dilatation of the common bile duct which measures up to 11 mm. No obstructing stone or mass noted. Pancreas: Calcifications within head of pancreas identified. No inflammation identified. Small low attenuation structure within  the pancreatic head measures 1.2 cm, image 105 of series 6. Spleen: Normal in size without focal abnormality. Adrenals/Urinary Tract: The left adrenal gland appears normal. Right adrenal adenoma is again noted measuring 3.3 cm. Multiple bilateral renal cysts are identified. Cyst with a possible 1 cm solid mural nodule arises lateral cortex of the left kidney, image 38 of series 3. No hydronephrosis identified. Urinary bladder appears normal. Stomach/Bowel: Stomach is normal. There is gaseous distension of the small bowel loops. No pathologic dilatation of the small bowel noted. The appendix is visualized and appears normal. Gas and stool noted throughout normal caliber colon. No abnormal bowel wall thickening or inflammation identified. Vascular/Lymphatic: Aortic atherosclerosis. No aneurysm. No abdominal or pelvic adenopathy. Reproductive: Status post hysterectomy. No adnexal masses. Other: No abdominal wall hernia or abnormality. No abdominopelvic ascites. Musculoskeletal: Degenerative disc disease identified within the lower thoracic and the lumbar spine. L2 inferior endplate fracture appears stable. IMPRESSION: 1. No posttraumatic findings identified within the abdomen or pelvis. 2. There is mild diffuse gaseous distension of the small bowel loops without pathologic dilatation. Findings may be secondary to small bowel ileus/enteritis. 3.  Increase caliber of the intrahepatic bile ducts and common bile duct status post cholecystectomy. No stone or mass identified. There are calcifications noted within head of pancreas which may reflect sequelae of chronic pancreatitis which could conceivably result in distal common bile duct narrowing. More definitive assessment for underlying mass lesion or stone may be obtained with contrast enhanced MRI/MRCP. 4. Small low density structure within the head of pancreas is identified measuring 1.2 cm. Nonspecific. In the setting of chronic pancreatitis this may represent a small pseudocyst. More definitive assessment with contrast enhanced MRI of the abdomen is advise. 5. Bilateral kidney cysts. A cyst within the lateral left kidney contains a possible solid mural nodule. This could also be better assessed with MRI. 6.  Aortic Atherosclerosis (ICD10-I70.0). 7. Lumbar spondylosis with stable, chronic L2 inferior endplate fracture. Electronically Signed   By: Kerby Moors M.D.   On: 01/17/2017 12:49    Tonette Bihari, MD 01/17/2017, 8:01 PM PGY-2, [place] Intern pager: [phone], text pages welcome

## 2017-01-18 ENCOUNTER — Observation Stay (HOSPITAL_COMMUNITY): Payer: Medicare Other

## 2017-01-18 ENCOUNTER — Inpatient Hospital Stay (HOSPITAL_COMMUNITY): Payer: Medicare Other

## 2017-01-18 ENCOUNTER — Encounter (HOSPITAL_COMMUNITY): Payer: Self-pay | Admitting: *Deleted

## 2017-01-18 DIAGNOSIS — W19XXXA Unspecified fall, initial encounter: Secondary | ICD-10-CM

## 2017-01-18 DIAGNOSIS — R1011 Right upper quadrant pain: Secondary | ICD-10-CM

## 2017-01-18 DIAGNOSIS — K8689 Other specified diseases of pancreas: Secondary | ICD-10-CM

## 2017-01-18 DIAGNOSIS — S2231XD Fracture of one rib, right side, subsequent encounter for fracture with routine healing: Secondary | ICD-10-CM

## 2017-01-18 DIAGNOSIS — W19XXXD Unspecified fall, subsequent encounter: Secondary | ICD-10-CM | POA: Diagnosis not present

## 2017-01-18 DIAGNOSIS — K869 Disease of pancreas, unspecified: Secondary | ICD-10-CM | POA: Diagnosis not present

## 2017-01-18 LAB — CBC
HCT: 33.5 % — ABNORMAL LOW (ref 36.0–46.0)
Hemoglobin: 10.8 g/dL — ABNORMAL LOW (ref 12.0–15.0)
MCH: 29.9 pg (ref 26.0–34.0)
MCHC: 32.2 g/dL (ref 30.0–36.0)
MCV: 92.8 fL (ref 78.0–100.0)
Platelets: 175 10*3/uL (ref 150–400)
RBC: 3.61 MIL/uL — AB (ref 3.87–5.11)
RDW: 14.3 % (ref 11.5–15.5)
WBC: 6.9 10*3/uL (ref 4.0–10.5)

## 2017-01-18 LAB — COMPREHENSIVE METABOLIC PANEL
ALT: 24 U/L (ref 14–54)
AST: 21 U/L (ref 15–41)
Albumin: 3.1 g/dL — ABNORMAL LOW (ref 3.5–5.0)
Alkaline Phosphatase: 35 U/L — ABNORMAL LOW (ref 38–126)
Anion gap: 8 (ref 5–15)
BILIRUBIN TOTAL: 1 mg/dL (ref 0.3–1.2)
BUN: 19 mg/dL (ref 6–20)
CHLORIDE: 107 mmol/L (ref 101–111)
CO2: 24 mmol/L (ref 22–32)
Calcium: 8.3 mg/dL — ABNORMAL LOW (ref 8.9–10.3)
Creatinine, Ser: 1.02 mg/dL — ABNORMAL HIGH (ref 0.44–1.00)
GFR calc Af Amer: 57 mL/min — ABNORMAL LOW (ref >60)
GFR, EST NON AFRICAN AMERICAN: 49 mL/min — AB (ref >60)
Glucose, Bld: 98 mg/dL (ref 65–99)
Potassium: 4.2 mmol/L (ref 3.5–5.1)
Sodium: 139 mmol/L (ref 135–145)
Total Protein: 5.4 g/dL — ABNORMAL LOW (ref 6.5–8.1)

## 2017-01-18 LAB — GLUCOSE, CAPILLARY
Glucose-Capillary: 93 mg/dL (ref 65–99)
Glucose-Capillary: 100 mg/dL — ABNORMAL HIGH (ref 65–99)
Glucose-Capillary: 102 mg/dL — ABNORMAL HIGH (ref 65–99)
Glucose-Capillary: 97 mg/dL (ref 65–99)
Glucose-Capillary: 113 mg/dL — ABNORMAL HIGH (ref 65–99)

## 2017-01-18 LAB — HEMOGLOBIN A1C
HEMOGLOBIN A1C: 5.4 % (ref 4.8–5.6)
MEAN PLASMA GLUCOSE: 108 mg/dL

## 2017-01-18 MED ORDER — TRAMADOL HCL 50 MG PO TABS
25.0000 mg | ORAL_TABLET | Freq: Four times a day (QID) | ORAL | Status: DC
Start: 2017-01-18 — End: 2017-01-19
  Administered 2017-01-18 – 2017-01-19 (×6): 25 mg via ORAL
  Filled 2017-01-18 (×6): qty 1

## 2017-01-18 MED ORDER — BACLOFEN 10 MG PO TABS
5.0000 mg | ORAL_TABLET | Freq: Three times a day (TID) | ORAL | Status: DC
  Administered 2017-01-18: 5 mg via ORAL
  Filled 2017-01-18: qty 1

## 2017-01-18 MED ORDER — LIDOCAINE 5 % EX PTCH
1.0000 | MEDICATED_PATCH | CUTANEOUS | Status: DC
  Administered 2017-01-18 – 2017-01-19 (×2): 1 via TRANSDERMAL
  Filled 2017-01-18 (×3): qty 1

## 2017-01-18 NOTE — Evaluation (Signed)
Physical Therapy Evaluation Patient Details Name: Amber Knox MRN: [id number] DOB: [birth year] Today's Date: 01/18/2017   History of Present Illness  Amber Knox is a 83 y.o. female presenting with RUQ abdominal pain. PMH is significant for history of PE and DVT, multiple fractures, HLD, HTN, T2DM, GERD, hypothyroidism. Pt with recent fall with 10th R rib fx  Clinical Impression  Pt admitted with above diagnosis. Pt currently with functional limitations due to the deficits listed below (see PT Problem List). Pt will benefit from skilled PT to increase their independence and safety with mobility to allow discharge to the venue listed below.  Prior to admission, pt ambulated with cane or rollator and works as a Writer.  She has 10/10 pain with bed mobility and difficulty with transitional movements. Recommend SNF and pt/daughter are in agreement.     Follow Up Recommendations SNF (hoping for [facility] in [place])    Equipment Recommendations  None recommended by PT    Recommendations for Other Services       Precautions / Restrictions Precautions Precautions: Fall Restrictions Weight Bearing Restrictions: No      Mobility  Bed Mobility Overal bed mobility: Needs Assistance Bed Mobility: Rolling;Sidelying to Sit Rolling: Min guard Sidelying to sit: Min guard       General bed mobility comments: It took 3 attempts to get up with pt doing best when she only had verbal cueing.  She needs increased time and cues to brace R side. Heavy use of rail.  Transfers Overall transfer level: Needs assistance Equipment used: Rolling walker (2 wheeled) Transfers: Sit to/from Stand Sit to Stand: From elevated surface;Min assist         General transfer comment: bed lifted so that pt basically brought into standing and she did not have to push up. Stood up from elevated BSC as well. Built up recliner with pillows.  Ambulation/Gait   Ambulation Distance (Feet): 20 Feet  (x2) Assistive device: Rolling walker (2 wheeled) Gait Pattern/deviations: Decreased step length - right;Decreased step length - left Gait velocity: decreased   General Gait Details: Pt with decreased step length with slow guarded gait, but min/guard.  2 episodes of pain, but able to continue. Cues to not pick up RW  Stairs            Wheelchair Mobility    Modified Rankin (Stroke Patients Only)       Balance Overall balance assessment: History of Falls;Needs assistance         Standing balance support: During functional activity Standing balance-Leahy Scale: Fair Standing balance comment: Pt able to wipe self and stand without UE support while tying gown                             Pertinent Vitals/Pain Pain Assessment: 0-10 Pain Score: 10-Worst pain ever Pain Location: R side and back Pain Descriptors / Indicators: Sharp;Stabbing Pain Intervention(s): Limited activity within patient's tolerance;Repositioned;Premedicated before session (muscle relaxer)    Home Living Family/patient expects to be discharged to:: Private residence Living Arrangements: Alone Available Help at Discharge: Available PRN/intermittently Type of Home: House Home Access: Ramped entrance     Home Layout: One level Home Equipment: Dodge - single point;Walker - 4 wheels;Grab bars - toilet (lift chair, grab bars on R which pt can't use now due to pain) 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, nature Level of Independence: Independent with assistive device(s)  Comments: cane in community and rollator at home and when shopping     Hand Dominance   Dominant Hand: Right    Extremity/Trunk Assessment   Upper Extremity Assessment Upper Extremity Assessment: Defer to OT evaluation    Lower Extremity Assessment Lower Extremity Assessment: Overall WFL for tasks assessed       Communication   Communication: No difficulties  Cognition Arousal/Alertness:  Awake/alert Behavior During Therapy: WFL for tasks assessed/performed Overall Cognitive Status: Within Functional Limits for tasks assessed                                        General Comments      Exercises     Assessment/Plan    PT Assessment Patient needs continued PT services  PT Problem List Decreased activity tolerance;Decreased balance;Pain;Decreased mobility;Decreased knowledge of precautions;Decreased knowledge of use of DME       PT Treatment Interventions Gait training;Functional mobility training;Therapeutic activities;Therapeutic exercise;DME instruction;Balance training    PT Goals (Current goals can be found in the Care Plan section)  Acute Rehab PT Goals Patient Stated Goal: go to rehab PT Goal Formulation: With patient/family Time For Goal Achievement: 02/01/17 Potential to Achieve Goals: Good    Frequency Min 3X/week   Barriers to discharge Decreased caregiver support      Co-evaluation               AM-PAC PT "6 Clicks" Daily Activity  Outcome Measure Difficulty turning over in bed (including adjusting bedclothes, sheets and blankets)?: A Lot Difficulty moving from lying on back to sitting on the side of the bed? : A Lot Difficulty sitting down on and standing up from a chair with arms (e.g., wheelchair, bedside commode, etc,.)?: A Lot Help needed moving to and from a bed to chair (including a wheelchair)?: A Little Help needed walking in hospital room?: A Little Help needed climbing 3-5 steps with a railing? : A Lot 6 Click Score: 14    End of Session   Activity Tolerance: Patient limited by pain Patient left: in chair;with call bell/phone within reach;with family/visitor present Nurse Communication: Mobility status PT Visit Diagnosis: Difficulty in walking, not elsewhere classified (R26.2)    Time: 2111-7356 PT Time Calculation (min) (ACUTE ONLY): 29 min   Charges:   PT Evaluation $PT Eval Moderate Complexity: 1  Procedure PT Treatments $Therapeutic Activity: 8-22 mins   PT G Codes:        Shaquella Stamant L. Tamala Julian, [place] Pager [phone] 01/18/2017   Galen Manila 01/18/2017, 11:41 AM

## 2017-01-18 NOTE — Progress Notes (Signed)
Family Medicine Teaching Service Daily Progress Note Intern Pager: [phone]  Patient name: Amber Knox record number: [id number] Date of birth: September 30, 1929 Age: 83 y.o. Gender: female  Primary Care Provider: Jonathon Resides, MD Consultants: Surgery, GI Code Status: DNR  Pt Overview and Major Events to Date:  5/7 admit to FPTS  Assessment and Plan: Amber Knox is a 83 y.o. female presenting with RUQ abdominal pain. PMH is significant for history of PE and DVT, multiple fractures, HLD, HTN, T2DM, GERD, hypothyroidism, osteoporosis  Right flank/back pain:  Most likely 2/2 recent right 10th rib fracture. Patient with acute point-tenderness over posterior right tenth rib. R/o choledocholithiasis with negative RUQ U/S and surgically absent gall bladder, though if continues to have pain despite current management can consider GI consult/MRCP. Per surgery not likely intrabdominal process.  - RUQ U/S pending for r/o choledocholithiasis - Continue home PPI  - Consider ducolax suppository  - Transition pain regimen to baclofen 5 mg, lidoderm patch, tylenol and tramadol - thoracic spine series to check for fracture - PT/OT   Hypothyriodism - This admission TSH 4.562, T4 WNL - Continue home synthriod   HTN  - currently normotensive - hold ARB in setting of possibly dehydration - consider restarting irbesartan tomorrow  T2DM: A1c this admission 5.4. Home [place]  - SSI - CBGs AC/QHS   Osteoporosis: Hx of several fracture in the past. Per care everywhere receiving risedronate, though not noted in our system   Hx of Pulmonary embolism/DVT: prior to back surgery in 2015, developed PE and DVT. On xarelto for 6 months then stopped    Anemia, stable: Hgb 10.8 from 11.2,  - Consider an FOBT   - consider ferrous sulfate supplementation  CKD: Baseline btw 1-1.2. SCr 1.2 >>1.02 at baseline  - Continue trending BMET   FEN/GI: carb modified/heart healthy Prophylaxis: Lovenox    Disposition: Home    Subjective:  Patient complains of pain over posterior right 10th rib, worse with movement better when she lies still. No abdominal pain or nausea.  Objective: Temp:  [98.1 F (36.7 C)-99 F (37.2 C)] 98.1 F (36.7 C) (05/07 0554) Pulse Rate:  [57-65] 58 (05/07 0554) Resp:  [18-19] 18 (05/07 0554) BP: (135-157)/(36-67) 138/53 (05/07 0554) SpO2:  [92 %-98 %] 96 % (05/07 0554) Physical Exam: General: Sits comfortably in chair, no apparent distress Cardiovascular: RRR, no m/r/g Respiratory: CTA bil, no W/R/R Abdomen: soft and nontender over entire abdomen with mild tenderness over right 12th rib anteriorly Extremities: warm and well-perfused Back: +point ttp over right 10th rib posteriorly, no spinal or paraspinal tenderness  Laboratory: HbA1c 5.4 Trop negative TSH 4.562 T4 WNL B12 546 Folate high 34.5 Iron low/normal at 38, TIBC 259, Saturation ratios WNL at 15% Ferritin 142 Retic ct nl 1.2%    Recent Labs Lab 01/17/17 1053 01/17/17 1125 01/18/17 0639  WBC 7.5  --  6.9  HGB 11.6* 11.2* 10.8*  HCT 36.8 33.0* 33.5*  PLT 212  --  175    Recent Labs Lab 01/17/17 1053 01/17/17 1125 01/18/17 0639  NA  --  140 139  K  --  4.2 4.2  CL  --  105 107  CO2  --   --  24  BUN  --  30* 19  CREATININE  --  1.20* 1.02*  CALCIUM  --   --  8.3*  PROT 5.7*  --  5.4*  BILITOT 0.9  --  1.0  ALKPHOS 36*  --  35*  ALT 24  --  24  AST 22  --  21  GLUCOSE  --  95 98    WBC wnl. Lipase 32. AST/ALT wnl. LA 0.75. Ionized calcium 1.14.   Imaging/Diagnostic Tests: Dg Chest 2 View 01/17/2017 FINDINGS: Mild cardiac enlargement. Aortic atherosclerosis noted. There is no pleural effusion or edema. No airspace opacities. Spondylosis noted throughout the thoracic spine. Bilateral glenohumeral joint osteoarthritis, right greater in left. There is also bilateral AC joint osteoarthritis.  IMPRESSION: No active cardiopulmonary disease.   Ct Abdomen Pelvis W  Contrast 01/17/2017  IMPRESSION:  1. No posttraumatic findings identified within the abdomen or pelvis.  2. There is mild diffuse gaseous distension of the small bowel loops without pathologic dilatation. Findings may be secondary to small bowel ileus/enteritis.  3. Increase caliber of the intrahepatic bile ducts and common bile duct status post cholecystectomy. No stone or mass identified. There are calcifications noted within head of pancreas which may reflect sequelae of chronic pancreatitis which could conceivably result in distal common bile duct narrowing. More definitive assessment for underlying mass lesion or stone may be obtained with contrast enhanced MRI/MRCP.  4. Small low density structure within the head of pancreas is identified measuring 1.2 cm. Nonspecific. In the setting of chronic pancreatitis this may represent a small pseudocyst. More definitive assessment with contrast enhanced MRI of the abdomen is advise.  5. Bilateral kidney cysts. A cyst within the lateral left kidney contains a possible solid mural nodule. This could also be better assessed with MRI. 6.  Aortic Atherosclerosis    Everrett Coombe, MD 01/18/2017, 8:28 AM PGY-1, [place] Intern pager: [phone], text pages welcome

## 2017-01-18 NOTE — Care Management Obs Status (Signed)
[facility] NOTIFICATION   Patient Details  Name: NAKYLA BRACCO MRN: [id number] Date of Birth: April 27, 1930   Medicare Observation Status Notification Given:  Yes    Sharin Mons, RN 01/18/2017, 5:06 PM

## 2017-01-18 NOTE — Evaluation (Signed)
Occupational Therapy Evaluation Patient Details Name: Amber Knox MRN: [id number] DOB: [birth year] Today's Date: 01/18/2017    History of Present Illness Amber Knox is a 83 y.o. female presenting with RUQ abdominal pain. PMH is significant for history of PE and DVT, multiple fractures, HLD, HTN, T2DM, GERD, hypothyroidism. Pt with recent fall with 10th R rib fx   Clinical Impression   Pt is an 84 y/o F who presents with the above. Pt was previously independent-ModI with ADLs and functional mobility, currently presenting below baseline with completing tasks mostly due to pain. Pt will benefit from continued OT services to increase safety and independence with ADLs and functional mobility. Goals are for MinA to supervision.     Follow Up Recommendations  SNF    Equipment Recommendations  None recommended by OT           Precautions / Restrictions Precautions Precautions: Fall Restrictions Weight Bearing Restrictions: No      Mobility Bed Mobility Overal bed mobility: Needs Assistance Bed Mobility: Sit to Supine       Sit to supine: Min assist   General bed mobility comments: MinA for LEs, Pt able to use LUE and BLEs to pull herself higher up in bed   Transfers Overall transfer level: Needs assistance Equipment used: Rolling walker (2 wheeled) Transfers: Sit to/from Stand Sit to Stand: Min assist;Min guard         General transfer comment: MinA for sit to stand from recliner (increased assist secondary to pain), MinGuard for sit to stand from elevated BSC    Balance Overall balance assessment: History of Falls;Needs assistance Sitting-balance support: Single extremity supported;Feet supported Sitting balance-Leahy Scale: Fair     Standing balance support: During functional activity Standing balance-Leahy Scale: Fair                             ADL either performed or assessed with clinical judgement   ADL Overall ADL's : Needs  assistance/impaired Eating/Feeding: Independent;Sitting   Grooming: Wash/dry hands;Min guard;Standing   Upper Body Bathing: Set up;Sitting   Lower Body Bathing: Minimal assistance;Sit to/from stand   Upper Body Dressing : Set up;Sitting   Lower Body Dressing: Moderate assistance;Sit to/from stand   Toilet Transfer: Min guard;Ambulation;BSC;RW Toilet Transfer Details (indicate cue type and reason): BSC over toilet  Toileting- Clothing Manipulation and Hygiene: Min guard;Sit to/from stand       Functional mobility during ADLs: Min guard;Rolling walker                           Pertinent Vitals/Pain Pain Assessment: Faces Faces Pain Scale: Hurts whole lot Pain Location: R side and back Pain Descriptors / Indicators: Sharp;Stabbing Pain Intervention(s): Limited activity within patient's tolerance;Monitored during session;Repositioned     Hand Dominance Right   Extremity/Trunk Assessment Upper Extremity Assessment Upper Extremity Assessment: Overall WFL for tasks assessed           Communication Communication Communication: No difficulties   Cognition Arousal/Alertness: Awake/alert Behavior During Therapy: WFL for tasks assessed/performed Overall Cognitive Status: Within Functional Limits for tasks assessed                                     General Comments                  Home  Living Family/patient expects to be discharged to:: Private residence Living Arrangements: Alone Available Help at Discharge: Available PRN/intermittently;Family Type of Home: House Home Access: Ramped entrance     Home Layout: One level     Bathroom Shower/Tub:  (shower entry is flush with floor, no lip/step to enter)   Bathroom Toilet: Handicapped height     Home Equipment: [place] - single point;Walker - 4 wheels;Grab bars - toilet;Bedside commode   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 Level of Independence:  Independent with assistive device(s)        Comments: cane in community and rollator at home and when shopping        OT Problem List: Pain      OT Treatment/Interventions: Self-care/ADL training;DME and/or AE instruction;Therapeutic activities;Patient/family education    OT Goals(Current goals can be found in the care plan section) Acute Rehab OT Goals Patient Stated Goal: go to rehab OT Goal Formulation: With patient Time For Goal Achievement: 01/25/17 Potential to Achieve Goals: Good ADL Goals Pt Will Perform Grooming: with supervision;standing Pt Will Perform Lower Body Dressing: with min assist;with adaptive equipment;sit to/from stand Pt Will Transfer to Toilet: with supervision;ambulating;bedside commode (BSC over toilet) Pt Will Perform Toileting - Clothing Manipulation and hygiene: with supervision;sit to/from stand  OT Frequency: Min 2X/week                             AM-PAC PT "6 Clicks" Daily Activity     Outcome Measure Help from another person eating meals?: None Help from another person taking care of personal grooming?: A Little Help from another person toileting, which includes using toliet, bedpan, or urinal?: A Little Help from another person bathing (including washing, rinsing, drying)?: A Little Help from another person to put on and taking off regular upper body clothing?: A Little Help from another person to put on and taking off regular lower body clothing?: A Lot 6 Click Score: 18   End of Session Equipment Utilized During Treatment: Rolling walker Nurse Communication: Mobility status  Activity Tolerance: Patient tolerated treatment well Patient left: in bed;with call bell/phone within reach  OT Visit Diagnosis: Pain Pain - Right/Left: Right Pain - part of body:  (R side, back)                Time: 2952-8413 OT Time Calculation (min): 19 min Charges:  OT General Charges $OT Visit: 1 Procedure OT Evaluation $OT Eval Low  Complexity: 1 Procedure G-Codes: OT G-codes **NOT FOR INPATIENT CLASS** Functional Assessment Tool Used: AM-PAC 6 Clicks Daily Activity;Clinical judgement Functional Limitation: Self care Self Care Current Status (K4401): At least 40 percent but less than 60 percent impaired, limited or restricted Self Care Goal Status (U2725): At least 20 percent but less than 40 percent impaired, limited or restricted   Lou Cal, OT Pager [phone] 01/18/2017   Raymondo Band 01/18/2017, 4:06 PM

## 2017-01-18 NOTE — NC FL2 (Signed)
[place] LEVEL OF CARE SCREENING TOOL     IDENTIFICATION  Patient Name: Amber Knox Birthdate: July 21, 1930 Sex: female Admission Date (Current Location): 01/17/2017  [facility] and [place] Number:  Herbalist and Address:  The . [facility], [place] [address] [place], [place] [address]      Provider Number: [id number]  Attending Physician Name and Address:  Zenia Resides, MD  Relative Name and Phone Number:  [name], daughter, [phone]    Current Level of Care: Hospital Recommended Level of Care: [place] Prior Approval Number:    Date Approved/Denied:   PASRR Number: [id number]  Discharge Plan: SNF    Current Diagnoses: Patient Active Problem List   Diagnosis Date Noted  . Closed fracture of rib of right side with routine healing   . Pancreatic mass   . Fall   . RUQ abdominal pain   . Abdominal pain 01/17/2017  . Altered mental status 04/05/2014  . Acute encephalopathy 04/05/2014  . SOB (shortness of breath) 04/05/2014  . ARF (acute renal failure) ([place]) 04/05/2014  . Pulmonary embolism ([place]) 04/05/2014  . HNP (herniated nucleus pulposus), lumbar 02/16/2014  . Lumbar disc herniation with radiculopathy 02/16/2014  . Palliative care encounter 01/02/2014  . Pain of back and right lower extremity 01/02/2014  . Acute massive pulmonary embolism ([place]) 12/31/2013  . Shock circulatory ([place]) 12/31/2013  . Acute respiratory failure ([place]) 12/31/2013  . Lumbar disc disease 12/31/2013  . Fracture of femoral condyle, left, closed ([place]) 11/17/2013  . Nausea alone 11/17/2013  . Abnormality of gait 11/17/2013  . Back pain 11/17/2013  . Pain in joint, lower leg 10/30/2013  . Unspecified hypothyroidism 09/30/2013  . Type II or unspecified type diabetes mellitus without mention of complication, not stated as uncontrolled 09/30/2013  . Anemia 09/30/2013  . DM type 2 (diabetes mellitus, type 2) ([place])   . Hyperlipemia   .  Hypertension   . Peripheral neuropathy   . Osteopenia   . Hx of osteoarthritis   . Hiatal hernia   . GERD (gastroesophageal reflux disease)   . Kidney cysts   . Colon polyps   . Diverticulosis     Orientation RESPIRATION BLADDER Height & Weight     Self, Time, Situation, Place  Normal Continent Weight:   Height:     BEHAVIORAL SYMPTOMS/MOOD NEUROLOGICAL BOWEL NUTRITION STATUS      Continent Diet (Please see DC Summary)  AMBULATORY STATUS COMMUNICATION OF NEEDS Skin   Limited Assist Verbally Normal                       Personal Care Assistance Level of Assistance  Bathing, Feeding, Dressing Bathing Assistance: Limited assistance Feeding assistance: Independent Dressing Assistance: Limited assistance     Functional Limitations Info             SPECIAL CARE FACTORS FREQUENCY  PT (By licensed PT)     PT Frequency: 5x/week              Contractures      Additional Factors Info  Code Status, Allergies, Insulin Sliding Scale Code Status Info: DNR Allergies Info: Ace Inhibitors, Beta Adrenergic Blockers, Statins   Insulin Sliding Scale Info: 3x daily with meals       Current Medications (01/18/2017):  This is the current hospital active medication list Current Facility-Administered Medications  Medication Dose Route Frequency Provider Last Rate Last Dose  . acetaminophen (TYLENOL) tablet 650  mg  650 mg Oral Q6H PRN Tonette Bihari, MD   650 mg at 01/18/17 0636  . aspirin chewable tablet 81 mg  81 mg Oral Daily Mikell, Jeani Sow, MD   81 mg at 01/18/17 1014  . bisacodyl (DULCOLAX) suppository 10 mg  10 mg Rectal Once Mikell, Jeani Sow, MD      . dextrose 5 %-0.45 % sodium chloride infusion   Intravenous Continuous Tonette Bihari, MD 100 mL/hr at 01/17/17 2356    . enoxaparin (LOVENOX) injection 40 mg  40 mg Subcutaneous Q24H Mikell, Jeani Sow, MD   40 mg at 01/18/17 1015  . insulin aspart (novoLOG) injection 0-9 Units  0-9 Units  Subcutaneous TID WC Mikell, Jeani Sow, MD      . levothyroxine (SYNTHROID, LEVOTHROID) tablet 112 mcg  112 mcg Oral QAC breakfast Tonette Bihari, MD   112 mcg at 01/18/17 1015  . lidocaine (LIDODERM) 5 % 1 patch  1 patch Transdermal Q24H Everrett Coombe, MD   1 patch at 01/18/17 1023  . pantoprazole (PROTONIX) EC tablet 40 mg  40 mg Oral Daily Mikell, Jeani Sow, MD   40 mg at 01/18/17 1014  . sorbitol 70 % solution 30 mL  30 mL Oral Daily PRN Tonette Bihari, MD         Discharge Medications: Please see discharge summary for a list of discharge medications.  Relevant Imaging Results:  Relevant Lab Results:   Additional Information SSN: [place], [place]

## 2017-01-18 NOTE — Progress Notes (Signed)
[facility] Progress Note     Subjective: CC: RUQ and right chest wall pain This AM patient reports her pain is unchanged compared to yesterday - minimal pain at rest but severe RUQ pain with movement or coughing. Patient reports aggressive efforts to lose weight recently (150 lb WL). At home she reports taking a "diet tea" that contains Senakot, which causes her to have regular bowel movements. Her last BM was Wednesday and was normal. She is having flatus. Denies fever, chills, nausea, or vomiting. She denies regular narcotic use but was recently prescribed percocet for rib fracture.  Objective: Vital signs in last 24 hours: Temp:  [98.1 F (36.7 C)-99 F (37.2 C)] 98.1 F (36.7 C) (05/07 0554) Pulse Rate:  [57-65] 58 (05/07 0554) Resp:  [18-19] 18 (05/07 0554) BP: (135-157)/(36-67) 138/53 (05/07 0554) SpO2:  [92 %-98 %] 96 % (05/07 0554) Last BM Date: 01/13/17  Intake/Output from previous day: 05/06 0701 - 05/07 0700 In: 418.8 [I.V.:418.8] Out: 800 [Urine:800] Intake/Output this shift: No intake/output data recorded.  PE: Gen:  Alert, NAD, cooperative Eyes: PERRL, no scleral icterus  Card:  Regular rate and rhythm, pedal pulses 2+ BL Pulm:  Normal effort, clear to auscultation bilaterally Abd: Soft, TTP LUQ and epigastrium without peritonitis, patient again screams out in pain as I reach to examine her RUQ and endorses severe tenderness to light tough of RUQ/right chest wall, no lower abdominal tenderness, no distention, bowel sounds are present. Skin: warm and dry, no rashes  Psych: A&Ox3   Lab Results:   Recent Labs  01/17/17 1053 01/17/17 1125 01/18/17 0639  WBC 7.5  --  6.9  HGB 11.6* 11.2* 10.8*  HCT 36.8 33.0* 33.5*  PLT 212  --  175   BMET  Recent Labs  01/17/17 1125  NA 140  K 4.2  CL 105  GLUCOSE 95  BUN 30*  CREATININE 1.20*   PT/INR No results for input(s): LABPROT, INR in the last 72 hours. CMP     Component Value Date/Time    NA 140 01/17/2017 1125   NA 141 01/16/2014 1110   K 4.2 01/17/2017 1125   K 4.0 01/16/2014 1110   CL 105 01/17/2017 1125   CL 101 01/16/2014 1110   CO2 36 (H) 04/09/2014 0427   CO2 29 01/16/2014 1110   GLUCOSE 95 01/17/2017 1125   GLUCOSE 180 (H) 01/16/2014 1110   BUN 30 (H) 01/17/2017 1125   BUN 35 (H) 01/16/2014 1110   CREATININE 1.20 (H) 01/17/2017 1125   CREATININE 1.3 (H) 01/16/2014 1110   CALCIUM 9.6 04/09/2014 0427   CALCIUM 9.1 01/16/2014 1110   PROT 5.7 (L) 01/17/2017 1053   PROT 7.0 01/16/2014 1110   ALBUMIN 3.2 (L) 01/17/2017 1053   ALBUMIN 3.5 01/16/2014 1110   AST 22 01/17/2017 1053   AST 24 01/16/2014 1110   ALT 24 01/17/2017 1053   ALT 22 01/16/2014 1110   ALKPHOS 36 (L) 01/17/2017 1053   ALKPHOS 55 01/16/2014 1110   BILITOT 0.9 01/17/2017 1053   BILITOT 0.70 01/16/2014 1110   GFRNONAA 45 (L) 04/09/2014 0427   GFRNONAA 36 (L) 10/30/2013 0849   GFRAA 52 (L) 04/09/2014 0427   GFRAA 41 (L) 10/30/2013 0849   Lipase     Component Value Date/Time   LIPASE 32 01/17/2017 1053       Studies/Results: Dg Chest 2 View  Result Date: 01/17/2017 CLINICAL DATA:  Right upper quadrant pain and back pain. EXAM: CHEST  2  VIEW COMPARISON:  01/14/2017 FINDINGS: Mild cardiac enlargement. Aortic atherosclerosis noted. There is no pleural effusion or edema. No airspace opacities. Spondylosis noted throughout the thoracic spine. Bilateral glenohumeral joint osteoarthritis, right greater in left. There is also bilateral AC joint osteoarthritis. IMPRESSION: No active cardiopulmonary disease. Electronically Signed   By: Kerby Moors M.D.   On: 01/17/2017 12:53   Ct Abdomen Pelvis W Contrast  Result Date: 01/17/2017 CLINICAL DATA:  Recent fall.  Abdominal pain and back pain. EXAM: CT ABDOMEN AND PELVIS WITH CONTRAST TECHNIQUE: Multidetector CT imaging of the abdomen and pelvis was performed using the standard protocol following bolus administration of intravenous contrast.  CONTRAST:  34mL ISOVUE-300 IOPAMIDOL (ISOVUE-300) INJECTION 61% COMPARISON:  PET-CT 01/10/2014 FINDINGS: Lower chest: Small right pleural effusion with overlying atelectasis. Hepatobiliary: Previous cholecystectomy. Moderate intrahepatic bile duct dilatation. There is fusiform dilatation of the common bile duct which measures up to 11 mm. No obstructing stone or mass noted. Pancreas: Calcifications within head of pancreas identified. No inflammation identified. Small low attenuation structure within the pancreatic head measures 1.2 cm, image 105 of series 6. Spleen: Normal in size without focal abnormality. Adrenals/Urinary Tract: The left adrenal gland appears normal. Right adrenal adenoma is again noted measuring 3.3 cm. Multiple bilateral renal cysts are identified. Cyst with a possible 1 cm solid mural nodule arises lateral cortex of the left kidney, image 38 of series 3. No hydronephrosis identified. Urinary bladder appears normal. Stomach/Bowel: Stomach is normal. There is gaseous distension of the small bowel loops. No pathologic dilatation of the small bowel noted. The appendix is visualized and appears normal. Gas and stool noted throughout normal caliber colon. No abnormal bowel wall thickening or inflammation identified. Vascular/Lymphatic: Aortic atherosclerosis. No aneurysm. No abdominal or pelvic adenopathy. Reproductive: Status post hysterectomy. No adnexal masses. Other: No abdominal wall hernia or abnormality. No abdominopelvic ascites. Musculoskeletal: Degenerative disc disease identified within the lower thoracic and the lumbar spine. L2 inferior endplate fracture appears stable. IMPRESSION: 1. No posttraumatic findings identified within the abdomen or pelvis. 2. There is mild diffuse gaseous distension of the small bowel loops without pathologic dilatation. Findings may be secondary to small bowel ileus/enteritis. 3. Increase caliber of the intrahepatic bile ducts and common bile duct status post  cholecystectomy. No stone or mass identified. There are calcifications noted within head of pancreas which may reflect sequelae of chronic pancreatitis which could conceivably result in distal common bile duct narrowing. More definitive assessment for underlying mass lesion or stone may be obtained with contrast enhanced MRI/MRCP. 4. Small low density structure within the head of pancreas is identified measuring 1.2 cm. Nonspecific. In the setting of chronic pancreatitis this may represent a small pseudocyst. More definitive assessment with contrast enhanced MRI of the abdomen is advise. 5. Bilateral kidney cysts. A cyst within the lateral left kidney contains a possible solid mural nodule. This could also be better assessed with MRI. 6.  Aortic Atherosclerosis (ICD10-I70.0). 7. Lumbar spondylosis with stable, chronic L2 inferior endplate fracture. Electronically Signed   By: Kerby Moors M.D.   On: 01/17/2017 12:49    Anti-infectives: Anti-infectives    None     Assessment/Plan Chronic RUQ abdominal pain  - afebrile, VSS, no leukocytosis, LFTs WNL - Hx laparoscopic cholecystectomy, Hx acute pancreatitis x 1  - CT Abd/Pelv 01/17/17: dilated intra and extra hepatic bile duct without obvious stone, calcifications in her pancreas, and a nonspecific density in the head of her pancreas - recommend MRCP for further evaluation of pancreas/bile ducts  -  No acute surgical needs, ok to resume a diet from a surgical perspective   Constipation - no s/s obstruction. Recommend a bowel regimen and minimizing narcotic medications as patient tolerates. R 10th Rib FX - pain control, IS/pulm toilet  PMH PE and DVT HTN HLD DM2 GERD Hypothyroidism   Will follow results of likely MRCP - low suspicion patient will require general surgery this admission.    LOS: 1 day    [facility] 01/18/2017, 7:57 AM Pager: [phone] Consults: [phone] Mon-Fri 7:00 am-4:30  pm Sat-Sun 7:00 am-11:30 am

## 2017-01-19 DIAGNOSIS — S2231XD Fracture of one rib, right side, subsequent encounter for fracture with routine healing: Secondary | ICD-10-CM | POA: Diagnosis not present

## 2017-01-19 DIAGNOSIS — K869 Disease of pancreas, unspecified: Secondary | ICD-10-CM | POA: Diagnosis not present

## 2017-01-19 DIAGNOSIS — R1011 Right upper quadrant pain: Secondary | ICD-10-CM | POA: Diagnosis not present

## 2017-01-19 DIAGNOSIS — W19XXXD Unspecified fall, subsequent encounter: Secondary | ICD-10-CM | POA: Diagnosis not present

## 2017-01-19 LAB — GLUCOSE, CAPILLARY
GLUCOSE-CAPILLARY: 109 mg/dL — AB (ref 65–99)
GLUCOSE-CAPILLARY: 102 mg/dL — AB (ref 65–99)
GLUCOSE-CAPILLARY: 112 mg/dL — AB (ref 65–99)

## 2017-01-19 LAB — BASIC METABOLIC PANEL
ANION GAP: 8 (ref 5–15)
BUN: 19 mg/dL (ref 6–20)
CO2: 26 mmol/L (ref 22–32)
Calcium: 8.5 mg/dL — ABNORMAL LOW (ref 8.9–10.3)
Chloride: 106 mmol/L (ref 101–111)
Creatinine, Ser: 1.17 mg/dL — ABNORMAL HIGH (ref 0.44–1.00)
GFR calc Af Amer: 48 mL/min — ABNORMAL LOW (ref >60)
GFR calc non Af Amer: 42 mL/min — ABNORMAL LOW (ref >60)
GLUCOSE: 97 mg/dL (ref 65–99)
POTASSIUM: 4.4 mmol/L (ref 3.5–5.1)
Sodium: 140 mmol/L (ref 135–145)

## 2017-01-19 LAB — CBC
HEMATOCRIT: 34.3 % — AB (ref 36.0–46.0)
Hemoglobin: 11.1 g/dL — ABNORMAL LOW (ref 12.0–15.0)
MCH: 29.8 pg (ref 26.0–34.0)
MCHC: 32.4 g/dL (ref 30.0–36.0)
MCV: 92 fL (ref 78.0–100.0)
PLATELETS: 208 10*3/uL (ref 150–400)
RBC: 3.73 MIL/uL — AB (ref 3.87–5.11)
RDW: 14 % (ref 11.5–15.5)
WBC: 7.9 10*3/uL (ref 4.0–10.5)

## 2017-01-19 MED ORDER — TRAMADOL HCL 50 MG PO TABS: 25.0000 mg | ORAL_TABLET | Freq: Four times a day (QID) | ORAL | 0 refills | Status: DC | PRN

## 2017-01-19 MED ORDER — BACLOFEN 10 MG PO TABS: 5.0000 mg | ORAL_TABLET | Freq: Two times a day (BID) | ORAL | Status: DC | PRN

## 2017-01-19 MED ORDER — ACETAMINOPHEN 325 MG PO TABS: 650.0000 mg | ORAL_TABLET | Freq: Four times a day (QID) | ORAL | Status: DC | PRN

## 2017-01-19 MED ORDER — BACLOFEN 10 MG PO TABS: 5.0000 mg | ORAL_TABLET | Freq: Two times a day (BID) | ORAL | 0 refills | Status: DC | PRN

## 2017-01-19 MED ORDER — LIDOCAINE 5 % EX PTCH: 1.0000 | MEDICATED_PATCH | CUTANEOUS | 0 refills | Status: DC

## 2017-01-19 NOTE — Progress Notes (Signed)
Physical Therapy Treatment Patient Details Name: Amber Knox MRN: [id number] DOB: 1929/12/03 Today's Date: 01/19/2017    History of Present Illness Amber Knox is a 83 y.o. female presenting with RUQ abdominal pain. PMH is significant for history of PE and DVT, multiple fractures, HLD, HTN, T2DM, GERD, hypothyroidism. Pt with recent fall with 10th R rib fx    PT Comments    Pt still having difficulty with transitional movements due to R flank pain from rib fracture however tolerated improved ambulation distance today.  Pt and daughter report plan is for SNF upon d/c.  Follow Up Recommendations  SNF     Equipment Recommendations  None recommended by PT    Recommendations for Other Services       Precautions / Restrictions Precautions Precautions: Fall    Mobility  Bed Mobility Overal bed mobility: Needs Assistance Bed Mobility: Rolling;Sidelying to Sit Rolling: Min guard Sidelying to sit: Min guard       General bed mobility comments: increased attempts due to pain, required increased time. Heavy use of rail.  Transfers Overall transfer level: Needs assistance Equipment used: Rolling walker (2 wheeled) Transfers: Sit to/from Stand Sit to Stand: From elevated surface;Min assist         General transfer comment: pt prefers bed lifted for better pain control. min/guard from standing from elevated BSC as well.  (recliner built up with pillows)  Ambulation/Gait Ambulation/Gait assistance: Min guard Ambulation Distance (Feet): 150 Feet Assistive device: Rolling walker (2 wheeled) Gait Pattern/deviations: Step-through pattern;Decreased stride length Gait velocity: decreased   General Gait Details: verbal cues for RW positioning and posture, pt reports pain controlled once standing, able to tolerate improved distance today   Stairs            Wheelchair Mobility    Modified Rankin (Stroke Patients Only)       Balance                                             Cognition Arousal/Alertness: Awake/alert Behavior During Therapy: WFL for tasks assessed/performed Overall Cognitive Status: Within Functional Limits for tasks assessed                                        Exercises      General Comments        Pertinent Vitals/Pain Pain Assessment: Faces Faces Pain Scale: Hurts even more Pain Location: R flank, rib fx area Pain Descriptors / Indicators: Sharp;Stabbing Pain Intervention(s): Limited activity within patient's tolerance;Monitored during session;Repositioned;Other (comment) (RN placed lidocaine patch while pt sitting EOB)    Home Living                      Prior Function            PT Goals (current goals can now be found in the care plan section) Progress towards PT goals: Progressing toward goals    Frequency    Min 3X/week      PT Plan Current plan remains appropriate    Co-evaluation              AM-PAC PT "6 Clicks" Daily Activity  Outcome Measure  Difficulty turning over in bed (including adjusting bedclothes, sheets and blankets)?: A Lot Difficulty  moving from lying on back to sitting on the side of the bed? : A Lot Difficulty sitting down on and standing up from a chair with arms (Knox.g., wheelchair, bedside commode, etc,.)?: A Lot Help needed moving to and from a bed to chair (including a wheelchair)?: A Little Help needed walking in hospital room?: A Little Help needed climbing 3-5 steps with a railing? : A Lot 6 Click Score: 14    End of Session   Activity Tolerance: Patient tolerated treatment well Patient left: in chair;with call bell/phone within reach;with family/visitor present   PT Visit Diagnosis: Difficulty in walking, not elsewhere classified (R26.2)     Time: 0037-9444 PT Time Calculation (min) (ACUTE ONLY): 22 min  Charges:  $Gait Training: 8-22 mins                    G Codes:       Amber Knox, PT,  DPT 01/19/2017 Pager: [phone]    Amber Knox 01/19/2017, 11:16 AM

## 2017-01-19 NOTE — Final Progress Note (Signed)
[facility] Progress Note     Subjective: CC: RUQ/Flank pain  Complaining of severe right pain this AM. Reports increased pain control and activity yesterday after taking a muscle relaxer. Had a BM. Denies fever, chills, nausea, vomiting.   Objective: Vital signs in last 24 hours: Temp:  [97.7 F (36.5 C)-98.9 F (37.2 C)] 98.1 F (36.7 C) (05/08 0619) Pulse Rate:  [58-66] 58 (05/08 0619) Resp:  [16-18] 16 (05/08 0619) BP: (139-155)/(43-53) 144/46 (05/08 0619) SpO2:  [94 %-97 %] 95 % (05/08 0619) Last BM Date: 01/18/17  Intake/Output from previous day: 05/07 0701 - 05/08 0700 In: 354.2 [I.V.:354.2] Out: 1800 [Urine:1800] Intake/Output this shift: No intake/output data recorded.  PE: Gen:  Alert, NAD, cooperative Eyes: PERRL, no scleral icterus  Card:  Regular rate and rhythm, pedal pulses 2+ BL Pulm:  Normal effort, clear to auscultation bilaterally. Right chest wall tenderness Abd: Soft, TTP RUQ, non-distended, bowel sounds present  Skin: warm and dry, no rashes  Psych: A&Ox3   Lab Results:   Recent Labs  01/18/17 0639 01/19/17 0613  WBC 6.9 7.9  HGB 10.8* 11.1*  HCT 33.5* 34.3*  PLT 175 208   BMET  Recent Labs  01/18/17 0639 01/19/17 0613  NA 139 140  K 4.2 4.4  CL 107 106  CO2 24 26  GLUCOSE 98 97  BUN 19 19  CREATININE 1.02* 1.17*  CALCIUM 8.3* 8.5*   PT/INR No results for input(s): LABPROT, INR in the last 72 hours. CMP     Component Value Date/Time   NA 140 01/19/2017 0613   NA 141 01/16/2014 1110   K 4.4 01/19/2017 0613   K 4.0 01/16/2014 1110   CL 106 01/19/2017 0613   CL 101 01/16/2014 1110   CO2 26 01/19/2017 0613   CO2 29 01/16/2014 1110   GLUCOSE 97 01/19/2017 0613   GLUCOSE 180 (H) 01/16/2014 1110   BUN 19 01/19/2017 0613   BUN 35 (H) 01/16/2014 1110   CREATININE 1.17 (H) 01/19/2017 0613   CREATININE 1.3 (H) 01/16/2014 1110   CALCIUM 8.5 (L) 01/19/2017 0613   CALCIUM 9.1 01/16/2014 1110   PROT 5.4 (L)  01/18/2017 0639   PROT 7.0 01/16/2014 1110   ALBUMIN 3.1 (L) 01/18/2017 0639   ALBUMIN 3.5 01/16/2014 1110   AST 21 01/18/2017 0639   AST 24 01/16/2014 1110   ALT 24 01/18/2017 0639   ALT 22 01/16/2014 1110   ALKPHOS 35 (L) 01/18/2017 0639   ALKPHOS 55 01/16/2014 1110   BILITOT 1.0 01/18/2017 0639   BILITOT 0.70 01/16/2014 1110   GFRNONAA 42 (L) 01/19/2017 0613   GFRNONAA 36 (L) 10/30/2013 0849   GFRAA 48 (L) 01/19/2017 0613   GFRAA 41 (L) 10/30/2013 0849   Lipase     Component Value Date/Time   LIPASE 32 01/17/2017 1053       Studies/Results: Dg Chest 2 View  Result Date: 01/17/2017 CLINICAL DATA:  Right upper quadrant pain and back pain. EXAM: CHEST  2 VIEW COMPARISON:  01/14/2017 FINDINGS: Mild cardiac enlargement. Aortic atherosclerosis noted. There is no pleural effusion or edema. No airspace opacities. Spondylosis noted throughout the thoracic spine. Bilateral glenohumeral joint osteoarthritis, right greater in left. There is also bilateral AC joint osteoarthritis. IMPRESSION: No active cardiopulmonary disease. Electronically Signed   By: Kerby Moors M.D.   On: 01/17/2017 12:53   Dg Thoracic Spine 2 View  Result Date: 01/18/2017 CLINICAL DATA:  Acute onset of posterior right mid thoracic pain, status  post fall. Initial encounter. EXAM: THORACIC SPINE 2 VIEWS COMPARISON:  Chest radiograph from 01/17/2017 FINDINGS: There is no evidence of fracture or subluxation. Vertebral bodies demonstrate normal height and alignment. Intervertebral disc spaces are preserved. Anterior osteophytes are noted along the lower thoracic spine. The visualized portions of both lungs are clear. The mediastinum is unremarkable in appearance. IMPRESSION: No evidence of fracture or subluxation along the thoracic spine. Electronically Signed   By: Garald Balding M.D.   On: 01/18/2017 18:35   Ct Abdomen Pelvis W Contrast  Result Date: 01/17/2017 CLINICAL DATA:  Recent fall.  Abdominal pain and back pain.  EXAM: CT ABDOMEN AND PELVIS WITH CONTRAST TECHNIQUE: Multidetector CT imaging of the abdomen and pelvis was performed using the standard protocol following bolus administration of intravenous contrast. CONTRAST:  45mL ISOVUE-300 IOPAMIDOL (ISOVUE-300) INJECTION 61% COMPARISON:  PET-CT 01/10/2014 FINDINGS: Lower chest: Small right pleural effusion with overlying atelectasis. Hepatobiliary: Previous cholecystectomy. Moderate intrahepatic bile duct dilatation. There is fusiform dilatation of the common bile duct which measures up to 11 mm. No obstructing stone or mass noted. Pancreas: Calcifications within head of pancreas identified. No inflammation identified. Small low attenuation structure within the pancreatic head measures 1.2 cm, image 105 of series 6. Spleen: Normal in size without focal abnormality. Adrenals/Urinary Tract: The left adrenal gland appears normal. Right adrenal adenoma is again noted measuring 3.3 cm. Multiple bilateral renal cysts are identified. Cyst with a possible 1 cm solid mural nodule arises lateral cortex of the left kidney, image 38 of series 3. No hydronephrosis identified. Urinary bladder appears normal. Stomach/Bowel: Stomach is normal. There is gaseous distension of the small bowel loops. No pathologic dilatation of the small bowel noted. The appendix is visualized and appears normal. Gas and stool noted throughout normal caliber colon. No abnormal bowel wall thickening or inflammation identified. Vascular/Lymphatic: Aortic atherosclerosis. No aneurysm. No abdominal or pelvic adenopathy. Reproductive: Status post hysterectomy. No adnexal masses. Other: No abdominal wall hernia or abnormality. No abdominopelvic ascites. Musculoskeletal: Degenerative disc disease identified within the lower thoracic and the lumbar spine. L2 inferior endplate fracture appears stable. IMPRESSION: 1. No posttraumatic findings identified within the abdomen or pelvis. 2. There is mild diffuse gaseous  distension of the small bowel loops without pathologic dilatation. Findings may be secondary to small bowel ileus/enteritis. 3. Increase caliber of the intrahepatic bile ducts and common bile duct status post cholecystectomy. No stone or mass identified. There are calcifications noted within head of pancreas which may reflect sequelae of chronic pancreatitis which could conceivably result in distal common bile duct narrowing. More definitive assessment for underlying mass lesion or stone may be obtained with contrast enhanced MRI/MRCP. 4. Small low density structure within the head of pancreas is identified measuring 1.2 cm. Nonspecific. In the setting of chronic pancreatitis this may represent a small pseudocyst. More definitive assessment with contrast enhanced MRI of the abdomen is advise. 5. Bilateral kidney cysts. A cyst within the lateral left kidney contains a possible solid mural nodule. This could also be better assessed with MRI. 6.  Aortic Atherosclerosis (ICD10-I70.0). 7. Lumbar spondylosis with stable, chronic L2 inferior endplate fracture. Electronically Signed   By: Kerby Moors M.D.   On: 01/17/2017 12:49   US Abdomen Limited Ruq  Result Date: 01/18/2017 CLINICAL DATA:  Right upper quadrant pain for 5 days. Recent fall. Cholecystectomy. EXAM: US ABDOMEN LIMITED - RIGHT UPPER QUADRANT COMPARISON:  04/06/2014 FINDINGS: Gallbladder: Absent. Common bile duct: Diameter: 8 mm. Liver: No focal liver mass. Normal echogenicity.  There is biliary dilatation which is expected after cholecystectomy. IMPRESSION: No acute right upper quadrant abdominal pathology. Postoperative changes are noted. Electronically Signed   By: Marybelle Killings M.D.   On: 01/18/2017 09:34    Anti-infectives: Anti-infectives    None     Assessment/Plan Right flank/back pain - afebrile, VSS, no leukocytosis, LFTs WNL - Hx laparoscopic cholecystectomy, Hx acute pancreatitis x 1  - CT Abd/Pelv 01/17/17: dilated intra and extra  hepatic bile duct without obvious stone, calcifications in her pancreas, and a nonspecific density in the head of her pancreas - recommend MRCP for further evaluation of pancreas/bile ducts  - recommend resumption of muscle relaxing medication if it improves pain control and ability to mobilize and use IS.    No acute surgical needs. Pain more consistent with recent rib fracture. Low suspicion for intraabdominal process. General surgery will sign off. Call with questions/concerns.     LOS: 1 day    [facility] 01/19/2017, 7:48 AM Pager: [phone] Consults: [phone] Mon-Fri 7:00 am-4:30 pm Sat-Sun 7:00 am-11:30 am

## 2017-01-19 NOTE — Discharge Instructions (Signed)
You were hospitalized with pain which is thought to be due to your rib fracture. You do not have any fractures in your spine. You were started on multiple medications for pain control, please take these as prescribed.

## 2017-01-19 NOTE — Clinical Social Work Note (Signed)
Clinical Social Work Assessment  Patient Details  Name: Amber Knox MRN: [id number] Date of Birth: April 11, 1930  Date of referral:  01/19/17               Reason for consult:  Facility Placement                Permission sought to share information with:  Facility Sport and exercise psychologist, Family Supports Permission granted to share information::  Yes, Verbal Permission Granted  Name::     Radiation protection practitioner::  SNFs  Relationship::  Daughter  Contact Information:  [phone]  Housing/Transportation Living arrangements for the past 2 months:  [place] of Information:  Patient, Adult Children Patient Interpreter Needed:  None Criminal Activity/Legal Involvement Pertinent to Current Situation/Hospitalization:  No - Comment as needed Significant Relationships:  Adult Children, Other Family Members Lives with:  Adult Children Do you feel safe going back to the place where you live?  No Need for family participation in patient care:  No (Coment)  Care giving concerns:  CSW received consult for possible SNF placement at time of discharge. CSW spoke with patient and patient's son-in-law regarding PT recommendation of SNF placement at time of discharge. Patient reported that family is currently unable to care for patient at their home given patient's current physical needs. Patient expressed understanding of PT recommendation and is agreeable to SNF placement at time of discharge. CSW to continue to follow and assist with discharge planning needs.   Social Worker assessment / plan:  CSW spoke with patient concerning possibility of rehab at [facility] before returning home.  Employment status:  Retired Nurse, adult PT Recommendations:  [place] / Referral to community resources:  [place]  Patient/Family's Response to care:  Patient recognizes need for rehab before returning home and is agreeable to a SNF in [place]. Patient reported preference for [facility] in [place] and would like to transport by car.  Patient/Family's Understanding of and Emotional Response to Diagnosis, Current Treatment, and Prognosis:  Patient/family is realistic regarding therapy needs and expressed being hopeful for SNF placement. Patient expressed understanding of CSW role and discharge process. She reported understanding of her medical condition and reported pain from her ribs. No questions/concerns about plan or treatment.    Emotional Assessment Appearance:  Appears stated age Attitude/Demeanor/Rapport:  Other (Appropriate) Affect (typically observed):  Accepting, Appropriate, Pleasant Orientation:  Oriented to Self, Oriented to Situation, Oriented to Place, Oriented to  Time Alcohol / Substance use:  Not Applicable Psych involvement (Current and /or in the community):  No (Comment)  Discharge Needs  Concerns to be addressed:  Care Coordination Readmission within the last 30 days:  No Current discharge risk:  None Barriers to Discharge:  Continued Medical Work up   [employer], [place] 01/19/2017, 8:05 AM

## 2017-01-19 NOTE — Discharge Summary (Signed)
[facility] Discharge Summary  Patient name: Amber Knox record number: [id number] Date of birth: 05-23-1930 Age: 83 y.o. Gender: female Date of Admission: 01/17/2017  Date of Discharge: 01/19/2017 Admitting Physician: Zenia Resides, MD  Primary Care Provider: Jonathon Resides, MD Consultants: None  Indication for Hospitalization: Back pain  Discharge Diagnoses/Problem List:  Patient Active Problem List   Diagnosis Date Noted  . Closed fracture of rib of right side with routine healing   . Pancreatic mass   . Fall   . RUQ abdominal pain   . Abdominal pain 01/17/2017  . Altered mental status 04/05/2014  . Acute encephalopathy 04/05/2014  . SOB (shortness of breath) 04/05/2014  . ARF (acute renal failure) ([place]) 04/05/2014  . Pulmonary embolism ([place]) 04/05/2014  . HNP (herniated nucleus pulposus), lumbar 02/16/2014  . Lumbar disc herniation with radiculopathy 02/16/2014  . Palliative care encounter 01/02/2014  . Pain of back and right lower extremity 01/02/2014  . Acute massive pulmonary embolism ([place]) 12/31/2013  . Shock circulatory ([place]) 12/31/2013  . Acute respiratory failure ([place]) 12/31/2013  . Lumbar disc disease 12/31/2013  . Fracture of femoral condyle, left, closed ([place]) 11/17/2013  . Nausea alone 11/17/2013  . Abnormality of gait 11/17/2013  . Back pain 11/17/2013  . Pain in joint, lower leg 10/30/2013  . Unspecified hypothyroidism 09/30/2013  . Type II or unspecified type diabetes mellitus without mention of complication, not stated as uncontrolled 09/30/2013  . Anemia 09/30/2013  . DM type 2 (diabetes mellitus, type 2) ([place])   . Hyperlipemia   . Hypertension   . Peripheral neuropathy   . Osteopenia   . Hx of osteoarthritis   . Hiatal hernia   . GERD (gastroesophageal reflux disease)   . Kidney cysts   . Colon polyps   . Diverticulosis      Disposition: SNF  Discharge Condition: Stable  Discharge Exam:  Temp:   [97.7 F (36.5 C)-98.9 F (37.2 C)] 98.1 F (36.7 C) (05/08 0619) Pulse Rate:  [58-66] 58 (05/08 0619) Resp:  [16-18] 16 (05/08 0619) BP: (139-155)/(43-53) 144/46 (05/08 0619) SpO2:  [94 %-97 %] 95 % (05/08 8366) Physical Exam: General: Lies in bed, NAD, nontoxic Cardiovascular: RRR, no m/r/g Respiratory: CTA bil, no W/R/R Abdomen: soft, nt, nd Extremities: warm and well-perfused Back: +point ttp over right 10th rib posteriorly, no spinal or paraspinal tenderness  Brief Hospital Course:  RICKA WESTRA a 83 y.o.femalepresenting with RUQ abdominal pain. PMH is significant for history of PE and DVT, multiple fractures, HLD, HTN, T2DM, GERD, hypothyroidism, osteoporosis  Patient was admitted for pain likely secondary to a recent right 10th rib fracture. She initially was thought to have right upper quadrant pain which was concerning for an intra-abdominal process. However right upper quadrant ultrasound was negative for choledocholithiasis, surgically absent gall bladder. Pain was clearly located over right 10th rib posteriorly.  Her  Pain was managed with tramadol, baclofen 5 mg, lidoderm patch over the area, and tylenol.  Spinal series was negative for an acute process.   Patient was seen by PT and SNF was recommended. Patient was agreeable, DC'd to SNF.    Issues for Follow Up:  1. Pain regimen at discharge: baclofen, tramadol, lidoderm patch, tylenol 2. Recommend continuing incentive spirometry while patient's ribs continue to heel  Significant Procedures: None  Significant Labs and Imaging:  HbA1c 5.4 Trop negative TSH 4.562 T4 WNL B12 546 Folate high 34.5 Iron low/normal at 38, TIBC 259, Saturation  ratios WNL at 15% Ferritin 142 Retic ct nl 1.2%  Recent Labs Lab 01/17/17 1053 01/17/17 1125 01/18/17 0639 01/19/17 0613  WBC 7.5  --  6.9 7.9  HGB 11.6* 11.2* 10.8* 11.1*  HCT 36.8 33.0* 33.5* 34.3*  PLT 212  --  175 208    Recent Labs Lab 01/17/17 1053   01/17/17 1125 01/17/17 1806 01/18/17 0639 01/19/17 0613  NA  --   --  140  --  139 140  K  --   < > 4.2  --  4.2 4.4  CL  --   --  105  --  107 106  CO2  --   --   --   --  24 26  GLUCOSE  --   --  95  --  98 97  BUN  --   --  30*  --  19 19  CREATININE  --   --  1.20*  --  1.02* 1.17*  CALCIUM  --   --   --   --  8.3* 8.5*  MG  --   --   --  2.1  --   --   PHOS  --   --   --  3.3  --   --   ALKPHOS 36*  --   --   --  35*  --   AST 22  --   --   --  21  --   ALT 24  --   --   --  24  --   ALBUMIN 3.2*  --   --   --  3.1*  --   < > = values in this interval not displayed.   Results/Tests Pending at Time of Discharge: None  Discharge Medications:  Allergies as of 01/19/2017      Reactions   Ace Inhibitors Swelling   Laryngeal Edema   Beta Adrenergic Blockers Swelling   Tongue swelling   Statins Swelling   Tongue swelling       Medication List    STOP taking these medications   oxyCODONE-acetaminophen 5-325 MG tablet Commonly known as:  PERCOCET/ROXICET     TAKE these medications   acetaminophen 325 MG tablet Commonly known as:  TYLENOL Take 2 tablets (650 mg total) by mouth every 6 (six) hours as needed (mild pain, fever >100.4).   ALLERGY PO Take 1 tablet by mouth daily as needed.   aspirin 81 MG chewable tablet Chew by mouth daily.   baclofen 10 MG tablet Commonly known as:  LIORESAL Take 0.5 tablets (5 mg total) by mouth 2 (two) times daily as needed for muscle spasms.   dexlansoprazole 60 MG capsule Commonly known as:  DEXILANT Take 1 capsule (60 mg total) by mouth daily.   Dulaglutide 1.5 MG/0.5ML Sopn Inject 1.5 mg into the skin every 7 (seven) days. Thursday   ibuprofen 200 MG tablet Commonly known as:  ADVIL,MOTRIN Take 600 mg by mouth every 6 (six) hours as needed for mild pain.   irbesartan 300 MG tablet Commonly known as:  AVAPRO Take 300 mg by mouth daily.   levothyroxine 112 MCG tablet Commonly known as:  SYNTHROID, LEVOTHROID Take 1  tablet (112 mcg total) by mouth daily.   lidocaine 5 % Commonly known as:  LIDODERM Place 1 patch onto the skin daily. Remove & Discard patch within 12 hours or as directed by MD Start taking on:  01/20/2017   MULTI-VITAMIN PO Take 1 tablet by  mouth daily.   traMADol 50 MG tablet Commonly known as:  ULTRAM Take 0.5 tablets (25 mg total) by mouth every 6 (six) hours as needed for moderate pain or severe pain.       Discharge Instructions: Please refer to Patient Instructions section of EMR for full details.  Patient was counseled important signs and symptoms that should prompt return to medical care, changes in medications, dietary instructions, activity restrictions, and follow up appointments.   Follow-Up Appointments:   Everrett Coombe, MD 01/19/2017, 4:29 PM PGY-1, [place]

## 2017-01-19 NOTE — Clinical Social Work Placement (Signed)
   CLINICAL SOCIAL WORK PLACEMENT  NOTE  Date:  01/19/2017  Patient Details  Name: Amber Knox MRN: [id number] Date of Birth: 1929/10/02  Clinical Social Work is seeking post-discharge placement for this patient at the [place] level of care (*CSW will initial, date and re-position this form in  chart as items are completed):  Yes   Patient/family provided with [place] Work Department's list of facilities offering this level of care within the geographic area requested by the patient (or if unable, by the patient's family).  Yes   Patient/family informed of their freedom to choose among providers that offer the needed level of care, that participate in Medicare, Medicaid or managed care program needed by the patient, have an available bed and are willing to accept the patient.  Yes   Patient/family informed of [facility]'s ownership interest in [facility] and [facility], as well as of the fact that they are under no obligation to receive care at these facilities.  PASRR submitted to EDS on       PASRR number received on       Existing PASRR number confirmed on 01/18/17     FL2 transmitted to all facilities in geographic area requested by pt/family on 01/18/17     FL2 transmitted to all facilities within larger geographic area on       Patient informed that his/her managed care company has contracts with or will negotiate with certain facilities, including the following:        Yes   Patient/family informed of bed offers received.  Patient chooses bed at [facility]     Physician recommends and patient chooses bed at      Patient to be transferred to [facility] on 01/19/17.  Patient to be transferred to facility by [facility]     Patient family notified on 01/19/17 of transfer.  Name of family member notified:  [name]     PHYSICIAN       Additional Comment:    _______________________________________________ Benard Halsted,  [place] 01/19/2017, 2:11 PM

## 2017-01-19 NOTE — Progress Notes (Signed)
Family Medicine Teaching Service Daily Progress Note Intern Pager: [phone]  Patient name: Amber Knox record number: [id number] Date of birth: 02/13/1930 Age: 83 y.o. Gender: female  Primary Care Provider: Jonathon Resides, MD Consultants: Surgery, GI Code Status: DNR  Pt Overview and Major Events to Date:  5/7 admit to FPTS  Assessment and Plan: Amber Knox is a 83 y.o. female presenting with RUQ abdominal pain. PMH is significant for history of PE and DVT, multiple fractures, HLD, HTN, T2DM, GERD, hypothyroidism, osteoporosis  Right flank/back pain:  Most likely 2/2 recent right 10th rib fracture. Patient with acute point-tenderness over posterior right tenth rib. R/o choledocholithiasis with negative RUQ U/S and surgically absent gall bladder, though if continues to have pain despite current management can consider GI consult/MRCP. Thoracic spine series negative for acute process. - RUQ U/S pending for r/o choledocholithiasis - Continue home PPI  - Consider ducolax suppository  - Transition pain regimen to baclofen 5 mg, lidoderm patch, tylenol and tramadol - PT/OT - SNF ([facility] in [place] preferred)  Hypothyriodism - This admission TSH 4.562, T4 WNL - Continue home synthriod   HTN  - currently normotensive - hold ARB in setting of possibly dehydration - consider restarting irbesartan tomorrow  T2DM: A1c this admission 5.4. Home [place]  - SSI - CBGs AC/QHS   Osteoporosis: Hx of several fracture in the past. Per care everywhere receiving risedronate, though not noted in our system   Hx of Pulmonary embolism/DVT: prior to back surgery in 2015, developed PE and DVT. On xarelto for 6 months then stopped    Anemia, stable: Hgb 10.8 from 11.2,  - Consider an FOBT   - consider ferrous sulfate supplementation  CKD: Baseline btw 1-1.2. SCr 1.2 >>1.02 at baseline  - Continue trending BMET   FEN/GI: carb modified/heart healthy Prophylaxis: Lovenox    Disposition: Home    Subjective:  No acute events overnight. Feels flexeril provides best relief. Agreeable to SNF  Objective: Temp:  [97.7 F (36.5 C)-98.9 F (37.2 C)] 98.1 F (36.7 C) (05/08 0619) Pulse Rate:  [58-66] 58 (05/08 0619) Resp:  [16-18] 16 (05/08 0619) BP: (139-155)/(43-53) 144/46 (05/08 0619) SpO2:  [94 %-97 %] 95 % (05/08 2952) Physical Exam: General: Lies in bed, NAD, nontoxic Cardiovascular: RRR, no m/r/g Respiratory: CTA bil, no W/R/R Abdomen: soft, nt, nd Extremities: warm and well-perfused Back: +point ttp over right 10th rib posteriorly, no spinal or paraspinal tenderness  Laboratory: HbA1c 5.4 Trop negative TSH 4.562 T4 WNL B12 546 Folate high 34.5 Iron low/normal at 38, TIBC 259, Saturation ratios WNL at 15% Ferritin 142 Retic ct nl 1.2%    Recent Labs Lab 01/17/17 1053 01/17/17 1125 01/18/17 0639 01/19/17 0613  WBC 7.5  --  6.9 7.9  HGB 11.6* 11.2* 10.8* 11.1*  HCT 36.8 33.0* 33.5* 34.3*  PLT 212  --  175 208    Recent Labs Lab 01/17/17 1053 01/17/17 1125 01/18/17 0639 01/19/17 0613  NA  --  140 139 140  K  --  4.2 4.2 4.4  CL  --  105 107 106  CO2  --   --  24 26  BUN  --  30* 19 19  CREATININE  --  1.20* 1.02* 1.17*  CALCIUM  --   --  8.3* 8.5*  PROT 5.7*  --  5.4*  --   BILITOT 0.9  --  1.0  --   ALKPHOS 36*  --  35*  --  ALT 24  --  24  --   AST 22  --  21  --   GLUCOSE  --  95 98 97    WBC wnl. Lipase 32. AST/ALT wnl. LA 0.75. Ionized calcium 1.14.   Imaging/Diagnostic Tests: Dg Chest 2 View 01/17/2017 FINDINGS: Mild cardiac enlargement. Aortic atherosclerosis noted. There is no pleural effusion or edema. No airspace opacities. Spondylosis noted throughout the thoracic spine. Bilateral glenohumeral joint osteoarthritis, right greater in left. There is also bilateral AC joint osteoarthritis.  IMPRESSION: No active cardiopulmonary disease.   Ct Abdomen Pelvis W Contrast 01/17/2017  IMPRESSION:  1. No  posttraumatic findings identified within the abdomen or pelvis.  2. There is mild diffuse gaseous distension of the small bowel loops without pathologic dilatation. Findings may be secondary to small bowel ileus/enteritis.  3. Increase caliber of the intrahepatic bile ducts and common bile duct status post cholecystectomy. No stone or mass identified. There are calcifications noted within head of pancreas which may reflect sequelae of chronic pancreatitis which could conceivably result in distal common bile duct narrowing. More definitive assessment for underlying mass lesion or stone may be obtained with contrast enhanced MRI/MRCP.  4. Small low density structure within the head of pancreas is identified measuring 1.2 cm. Nonspecific. In the setting of chronic pancreatitis this may represent a small pseudocyst. More definitive assessment with contrast enhanced MRI of the abdomen is advise.  5. Bilateral kidney cysts. A cyst within the lateral left kidney contains a possible solid mural nodule. This could also be better assessed with MRI. 6.  Aortic Atherosclerosis    Everrett Coombe, MD 01/19/2017, 9:36 AM PGY-1, [place] Intern pager: [phone], text pages welcome

## 2017-01-19 NOTE — Progress Notes (Signed)
Discharge instructions, RX's and follow up appts explained and provided to patient and family verbalized understanding. Family transporting patient to SNF rehab. Patient left floor via wheelchair accompanied by staff no c/o pain or shortness of breath at d/c.  Iam Lipson, Tivis Ringer, RN

## 2017-01-19 NOTE — Progress Notes (Signed)
Patient will DC to: [facility] Anticipated DC date: 01/19/17 Family notified: Acupuncturist by: Musician   Per MD patient ready for DC to [facility]. RN, patient, patient's family, and facility notified of DC. Discharge Summary sent to facility. RN given number for report [phone]).    CSW signing off.   Cedric Fishman, [place] Social Worker [phone]

## 2017-10-25 MED ORDER — PANTOPRAZOLE SODIUM 40 MG PO TBEC
40.00 | DELAYED_RELEASE_TABLET | ORAL | Status: DC
Start: 2017-10-26 — End: 2017-10-25

## 2017-10-25 MED ORDER — ENOXAPARIN SODIUM 40 MG/0.4ML ~~LOC~~ SOLN
40.00 | SUBCUTANEOUS | Status: DC
Start: 2017-10-25 — End: 2017-10-25

## 2017-10-25 MED ORDER — ASPIRIN EC 81 MG PO TBEC
81.00 | DELAYED_RELEASE_TABLET | ORAL | Status: DC
Start: 2017-10-26 — End: 2017-10-25

## 2017-10-25 MED ORDER — LEVOTHYROXINE SODIUM 112 MCG PO TABS
112.00 | ORAL_TABLET | ORAL | Status: DC
Start: 2017-10-26 — End: 2017-10-25

## 2017-10-25 MED ORDER — ALLOPURINOL 300 MG PO TABS
300.00 | ORAL_TABLET | ORAL | Status: DC
Start: 2017-10-26 — End: 2017-10-25

## 2017-10-25 MED ORDER — ONDANSETRON HCL 4 MG/2ML IJ SOLN
4.00 | INTRAMUSCULAR | Status: DC
Start: ? — End: 2017-10-25

## 2017-10-25 MED ORDER — DOCUSATE SODIUM 100 MG PO CAPS
100.00 | ORAL_CAPSULE | ORAL | Status: DC
Start: ? — End: 2017-10-25

## 2017-10-25 MED ORDER — MAGNESIUM OXIDE 400 MG PO TABS
400.00 | ORAL_TABLET | ORAL | Status: DC
Start: 2017-10-26 — End: 2017-10-25

## 2017-10-25 MED ORDER — TAMSULOSIN HCL 0.4 MG PO CAPS
0.40 | ORAL_CAPSULE | ORAL | Status: DC
Start: 2017-10-26 — End: 2017-10-25

## 2017-10-25 MED ORDER — CYCLOBENZAPRINE HCL 10 MG PO TABS
10.00 | ORAL_TABLET | ORAL | Status: DC
Start: ? — End: 2017-10-25

## 2017-10-25 MED ORDER — GUAIFENESIN ER 600 MG PO TB12
600.00 | ORAL_TABLET | ORAL | Status: DC
Start: 2017-10-25 — End: 2017-10-25

## 2017-10-25 MED ORDER — GENERIC EXTERNAL MEDICATION
1.00 | Status: DC
Start: ? — End: 2017-10-25

## 2017-10-25 MED ORDER — GENERIC EXTERNAL MEDICATION
500.00 | Status: DC
Start: 2017-11-04 — End: 2017-10-25

## 2017-10-25 MED ORDER — SODIUM CHLORIDE 0.9 % IJ SOLN
5.00 | INTRAMUSCULAR | Status: DC
Start: 2017-10-25 — End: 2017-10-25

## 2017-10-25 MED ORDER — ACETAMINOPHEN 325 MG PO TABS
650.00 | ORAL_TABLET | ORAL | Status: DC
Start: ? — End: 2017-10-25

## 2017-10-25 MED ORDER — CHOLECALCIFEROL 25 MCG (1000 UT) PO TABS
1000.00 | ORAL_TABLET | ORAL | Status: DC
Start: 2017-10-26 — End: 2017-10-25

## 2017-10-25 MED ORDER — LOSARTAN POTASSIUM 25 MG PO TABS
25.00 | ORAL_TABLET | ORAL | Status: DC
Start: 2017-10-26 — End: 2017-10-25

## 2017-10-25 MED ORDER — TRAMADOL HCL 50 MG PO TABS
50.00 | ORAL_TABLET | ORAL | Status: DC
Start: ? — End: 2017-10-25

## 2017-10-25 MED ORDER — BISACODYL 5 MG PO TBEC
10.00 | DELAYED_RELEASE_TABLET | ORAL | Status: DC
Start: ? — End: 2017-10-25

## 2017-10-25 MED ORDER — ALUMINUM-MAGNESIUM-SIMETHICONE 200-200-20 MG/5ML PO SUSP
30.00 | ORAL | Status: DC
Start: ? — End: 2017-10-25

## 2017-10-25 MED ORDER — GLUCOSE 40 % PO GEL
15.00 g | ORAL | Status: DC
Start: ? — End: 2017-10-25

## 2017-10-25 MED ORDER — HYDRALAZINE HCL 20 MG/ML IJ SOLN
10.00 | INTRAMUSCULAR | Status: DC
Start: ? — End: 2017-10-25

## 2017-10-25 MED ORDER — SODIUM CHLORIDE 0.9 % IJ SOLN
5.00 | INTRAMUSCULAR | Status: DC
Start: ? — End: 2017-10-25

## 2017-10-25 MED ORDER — INSULIN LISPRO 100 UNIT/ML ~~LOC~~ SOLN
2.00 | SUBCUTANEOUS | Status: DC
Start: 2017-10-25 — End: 2017-10-25

## 2017-10-25 MED ORDER — DEXTROSE 50 % IV SOLN
12.00 g | INTRAVENOUS | Status: DC
Start: ? — End: 2017-10-25

## 2017-10-25 MED ORDER — VITAMIN B-12 1000 MCG PO TABS
1000.00 | ORAL_TABLET | ORAL | Status: DC
Start: 2017-10-26 — End: 2017-10-25

## 2017-10-25 MED ORDER — CALCIUM-VITAMIN D 500-200 MG-UNIT PO TABS
1.00 | ORAL_TABLET | ORAL | Status: DC
Start: 2017-10-26 — End: 2017-10-25

## 2018-07-01 IMAGING — DX DG CHEST 2V
2 series · 2 of 2 positions shown · non-contrast
Comparison: Chest x-ray dated April 05, 2014

CLINICAL DATA: Patient tripped and fell when trying to open a set
of double the worse today. The patient fell onto her right side. The
patient is complaining of right ribcage and back discomfort.

EXAM:
CHEST  2 VIEW

[x chest ap]
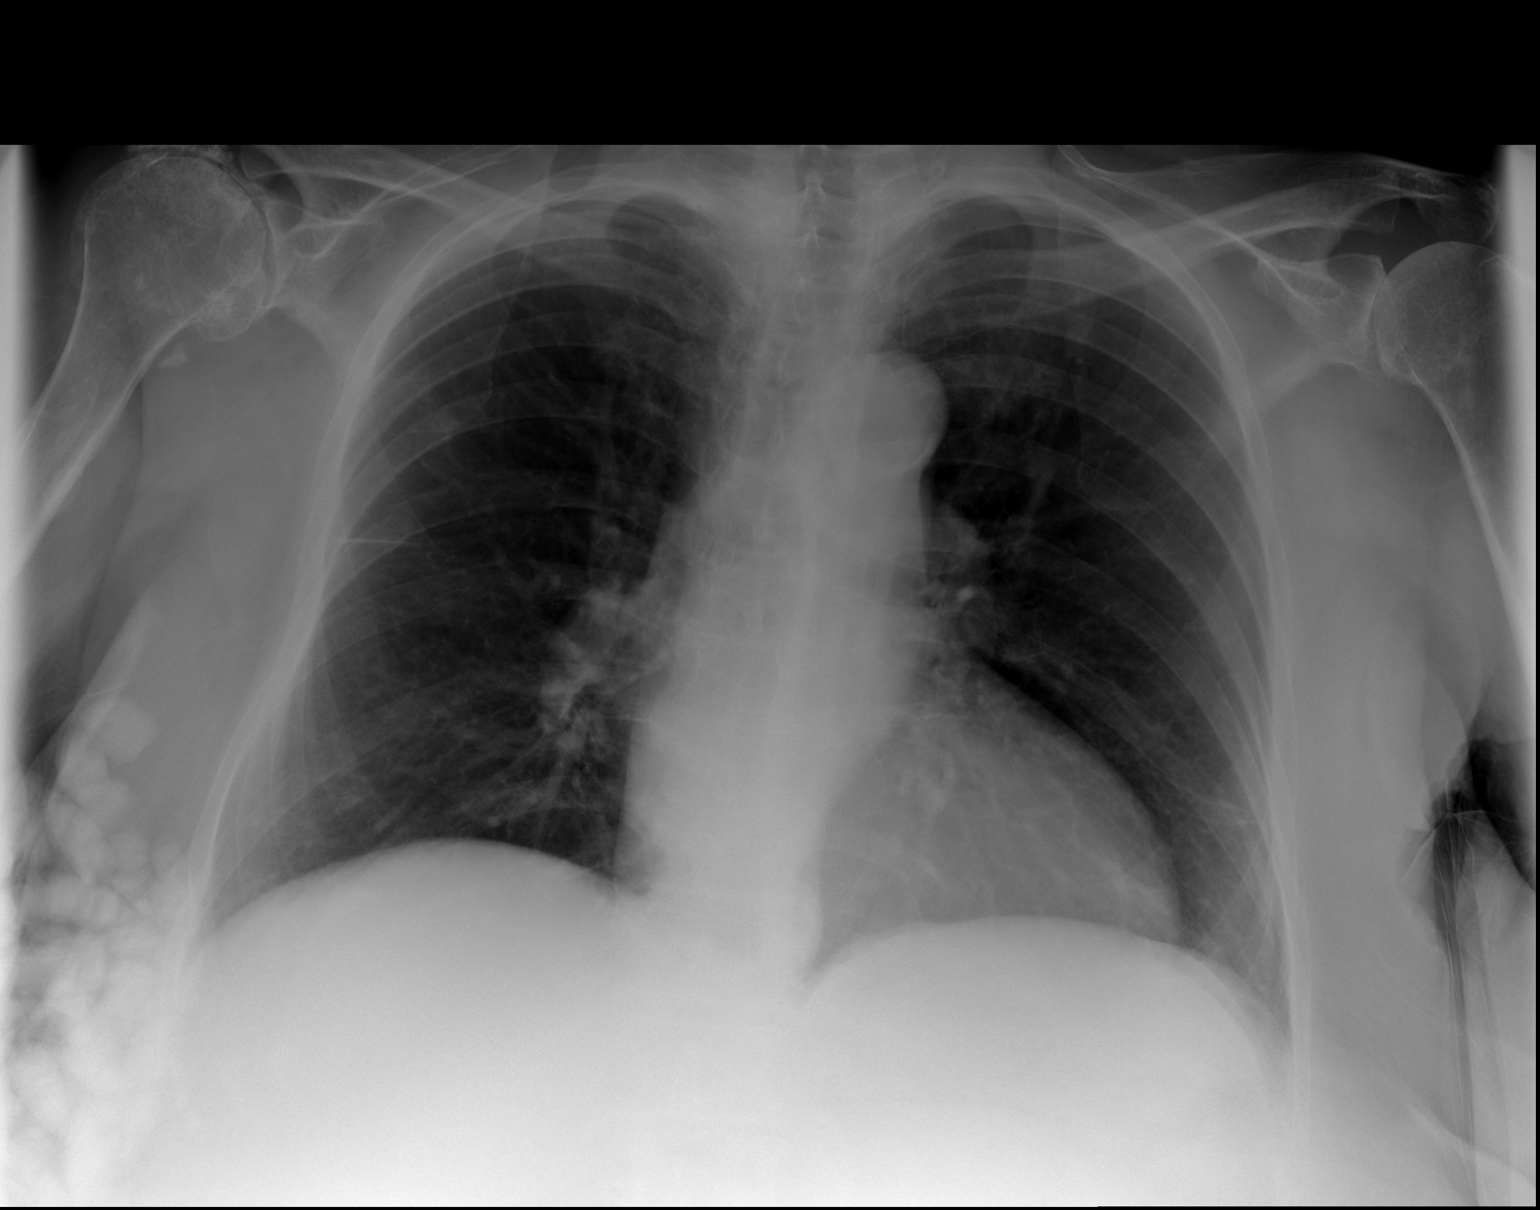

[w chest lat]
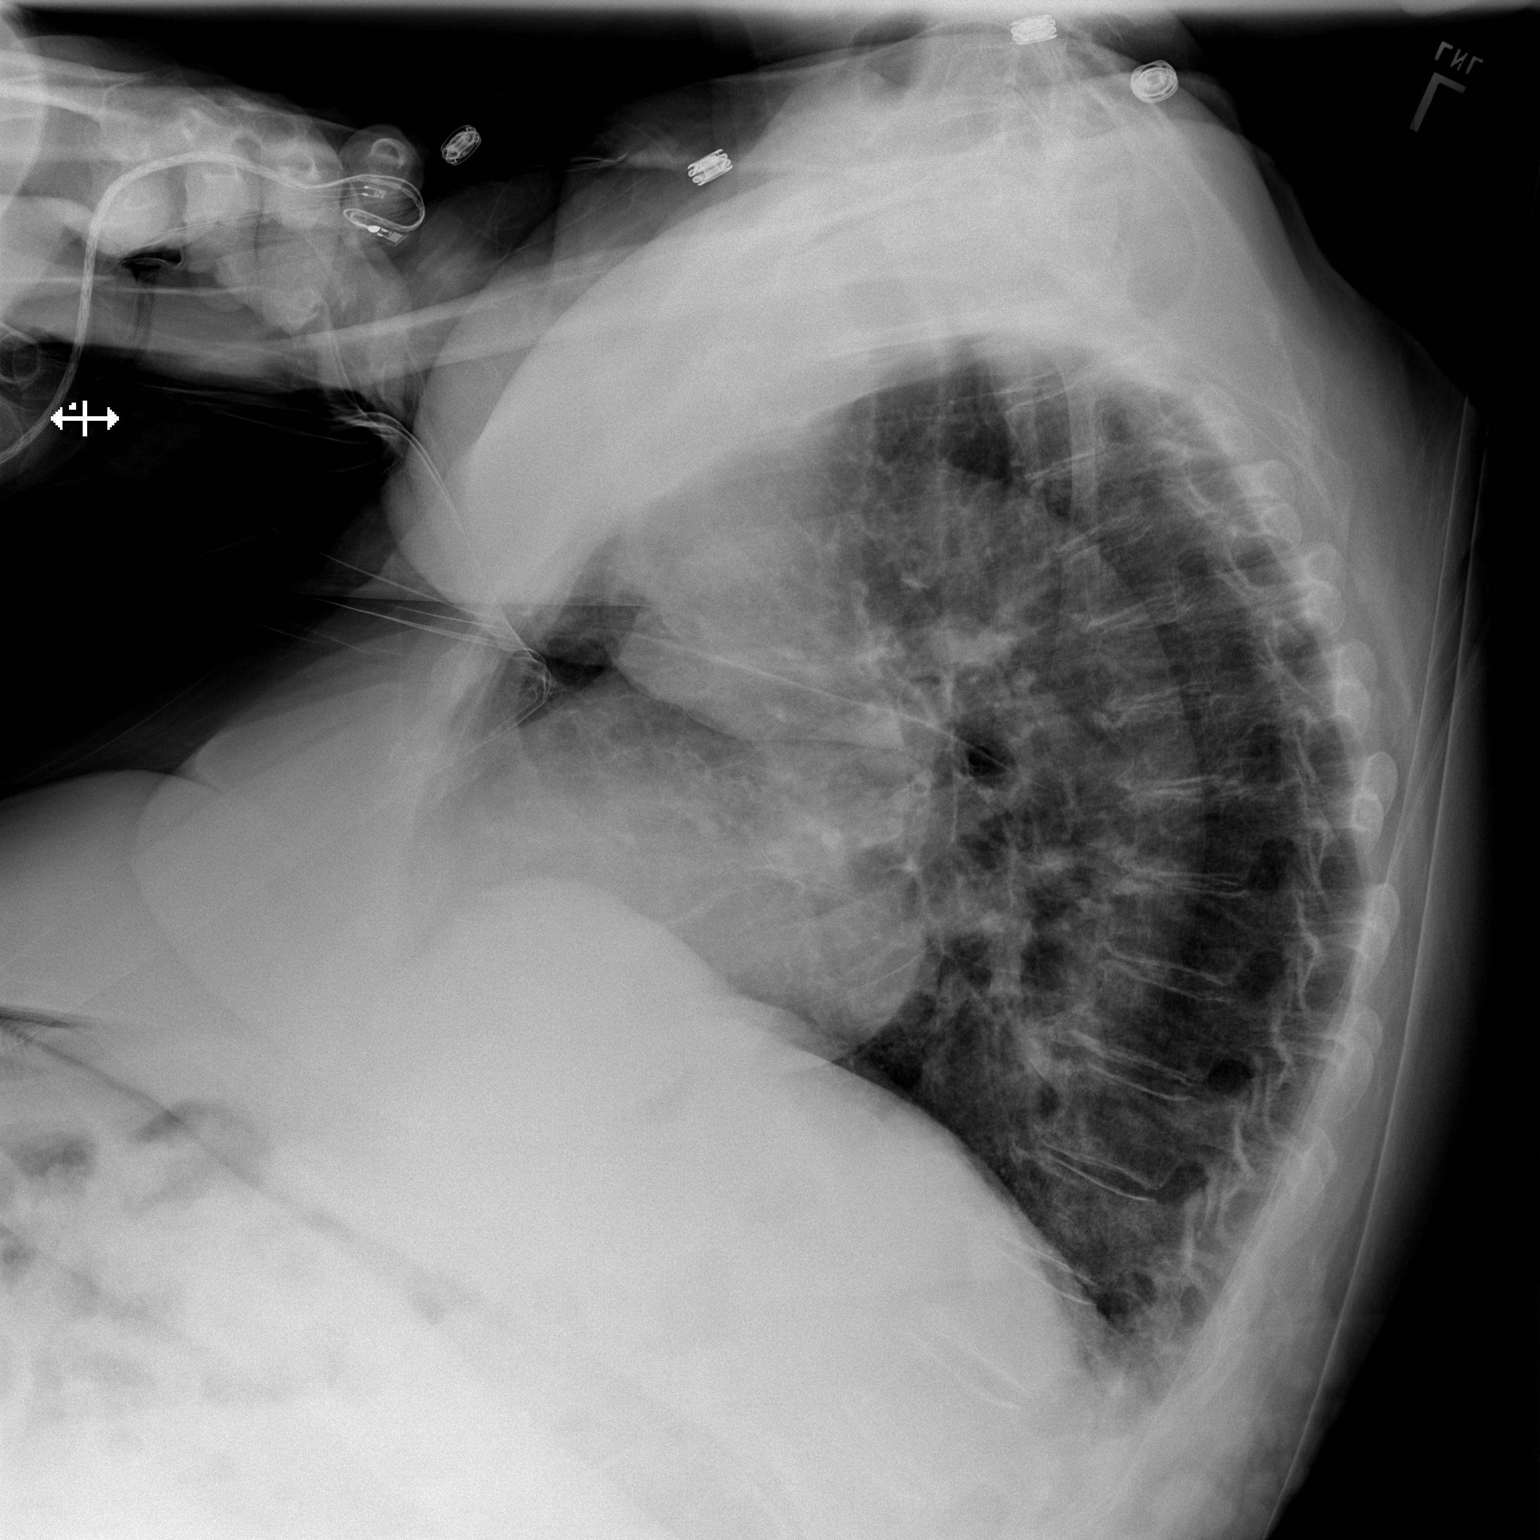

[2 of 2 positions shown; findings below may reference images not displayed]

FINDINGS: The lungs are adequately inflated and clear. There is no evidence of
a pulmonary contusion, pneumothorax, or pneumomediastinum. The heart
and pulmonary vascularity are normal. There is calcification in the
wall of the aortic arch. The observed ribs appear intact. There is
multilevel degenerative disc disease of the thoracic spine. There
degenerative changes of the right shoulder.
IMPRESSION: There is no acute cardiopulmonary abnormality. No definite acute rib
fracture is observed but the lower ribs are obscured. A right rib
series is recommended if the patient's symptoms warrant further
evaluation.

## 2019-03-27 MED ORDER — APIXABAN 5 MG PO TABS
10.00 | ORAL_TABLET | ORAL | Status: DC
Start: 2019-03-27 — End: 2019-03-27

## 2019-03-27 MED ORDER — ALBUTEROL SULFATE (2.5 MG/3ML) 0.083% IN NEBU
2.50 | INHALATION_SOLUTION | RESPIRATORY_TRACT | Status: DC
Start: ? — End: 2019-03-27

## 2019-03-27 MED ORDER — GENERIC EXTERNAL MEDICATION
200.00 | Status: DC
Start: 2019-03-28 — End: 2019-03-27

## 2019-03-27 MED ORDER — ONDANSETRON HCL 4 MG/2ML IJ SOLN
4.00 | INTRAMUSCULAR | Status: DC
Start: ? — End: 2019-03-27

## 2019-03-27 MED ORDER — PANTOPRAZOLE SODIUM 40 MG PO TBEC
40.00 | DELAYED_RELEASE_TABLET | ORAL | Status: DC
Start: 2019-03-28 — End: 2019-03-27

## 2019-03-27 MED ORDER — CYCLOBENZAPRINE HCL 10 MG PO TABS
5.00 | ORAL_TABLET | ORAL | Status: DC
Start: ? — End: 2019-03-27

## 2019-03-27 MED ORDER — LEVOTHYROXINE SODIUM 112 MCG PO TABS
112.00 | ORAL_TABLET | ORAL | Status: DC
Start: 2019-03-28 — End: 2019-03-27

## 2019-03-27 MED ORDER — LOSARTAN POTASSIUM 50 MG PO TABS
50.00 | ORAL_TABLET | ORAL | Status: DC
Start: 2019-03-27 — End: 2019-03-27

## 2019-03-27 MED ORDER — CETIRIZINE HCL 10 MG PO TABS
10.00 | ORAL_TABLET | ORAL | Status: DC
Start: 2019-03-28 — End: 2019-03-27

## 2019-03-27 MED ORDER — ASPIRIN EC 81 MG PO TBEC
81.00 | DELAYED_RELEASE_TABLET | ORAL | Status: DC
Start: 2019-03-28 — End: 2019-03-27

## 2019-03-27 MED ORDER — MONTELUKAST SODIUM 10 MG PO TABS
10.00 | ORAL_TABLET | ORAL | Status: DC
Start: 2019-03-27 — End: 2019-03-27

## 2019-03-27 MED ORDER — GENERIC EXTERNAL MEDICATION
100.00 | Status: DC
Start: 2019-03-28 — End: 2019-03-27

## 2019-03-27 MED ORDER — ACETAMINOPHEN 325 MG PO TABS
650.00 | ORAL_TABLET | ORAL | Status: DC
Start: ? — End: 2019-03-27

## 2019-08-30 MED ORDER — FLUTICASONE PROPIONATE 50 MCG/ACT NA SUSP
1.00 | NASAL | Status: DC
Start: 2019-08-31 — End: 2019-08-30

## 2019-08-30 MED ORDER — SIMETHICONE 80 MG PO CHEW
80.00 | CHEWABLE_TABLET | ORAL | Status: DC
Start: ? — End: 2019-08-30

## 2019-08-30 MED ORDER — GENERIC EXTERNAL MEDICATION
Status: DC
Start: ? — End: 2019-08-30

## 2019-08-30 MED ORDER — LEVOTHYROXINE SODIUM 112 MCG PO TABS
112.00 | ORAL_TABLET | ORAL | Status: DC
Start: 2019-08-31 — End: 2019-08-30

## 2019-08-30 MED ORDER — PROCHLORPERAZINE EDISYLATE 10 MG/2ML IJ SOLN
10.00 | INTRAMUSCULAR | Status: DC
Start: ? — End: 2019-08-30

## 2019-08-30 MED ORDER — SENNOSIDES-DOCUSATE SODIUM 8.6-50 MG PO TABS
1.00 | ORAL_TABLET | ORAL | Status: DC
Start: 2019-08-30 — End: 2019-08-30

## 2019-08-30 MED ORDER — ACETAMINOPHEN 325 MG PO TABS
650.00 | ORAL_TABLET | ORAL | Status: DC
Start: 2019-08-30 — End: 2019-08-30

## 2019-08-30 MED ORDER — ENOXAPARIN SODIUM 100 MG/ML ~~LOC~~ SOLN
1.00 | SUBCUTANEOUS | Status: DC
Start: 2019-08-30 — End: 2019-08-30

## 2019-08-30 MED ORDER — PANTOPRAZOLE SODIUM 40 MG PO TBEC
40.00 | DELAYED_RELEASE_TABLET | ORAL | Status: DC
Start: 2019-08-31 — End: 2019-08-30

## 2019-08-30 MED ORDER — ALLOPURINOL 300 MG PO TABS
300.00 | ORAL_TABLET | ORAL | Status: DC
Start: 2019-08-31 — End: 2019-08-30

## 2021-03-14 DEATH — deceased
